# Patient Record
Sex: Male | Born: 1940 | Race: White | Hispanic: No | Marital: Married | Smoking: Former smoker
Health system: Southern US, Community
[De-identification: ages and names within clinical notes are randomized; demographics above are authoritative.]

## PROBLEM LIST (undated history)

## (undated) DIAGNOSIS — I10 Essential (primary) hypertension: Secondary | ICD-10-CM

## (undated) DIAGNOSIS — G629 Polyneuropathy, unspecified: Secondary | ICD-10-CM

## (undated) DIAGNOSIS — K900 Celiac disease: Secondary | ICD-10-CM

## (undated) DIAGNOSIS — Z992 Dependence on renal dialysis: Secondary | ICD-10-CM

## (undated) DIAGNOSIS — K862 Cyst of pancreas: Secondary | ICD-10-CM

## (undated) DIAGNOSIS — H9190 Unspecified hearing loss, unspecified ear: Secondary | ICD-10-CM

## (undated) DIAGNOSIS — Z87442 Personal history of urinary calculi: Secondary | ICD-10-CM

## (undated) DIAGNOSIS — K5792 Diverticulitis of intestine, part unspecified, without perforation or abscess without bleeding: Secondary | ICD-10-CM

## (undated) DIAGNOSIS — Z87448 Personal history of other diseases of urinary system: Secondary | ICD-10-CM

## (undated) DIAGNOSIS — D75829 Heparin-induced thrombocytopenia, unspecified: Secondary | ICD-10-CM

## (undated) DIAGNOSIS — D7582 Heparin induced thrombocytopenia (HIT): Secondary | ICD-10-CM

## (undated) HISTORY — DX: Essential (primary) hypertension: I10

## (undated) HISTORY — DX: Personal history of other diseases of urinary system: Z87.448

## (undated) HISTORY — DX: Celiac disease: K90.0

## (undated) HISTORY — PX: EXPLORATORY LAPAROTOMY: SUR591

## (undated) HISTORY — DX: Heparin induced thrombocytopenia (HIT): D75.82

## (undated) HISTORY — DX: Personal history of urinary calculi: Z87.442

## (undated) HISTORY — PX: TRANSURETHRAL RESECTION OF PROSTATE: SHX73

## (undated) HISTORY — DX: Heparin-induced thrombocytopenia, unspecified: D75.829

## (undated) HISTORY — PX: CHOLECYSTECTOMY: SHX55

## (undated) HISTORY — DX: Dependence on renal dialysis: Z99.2

## (undated) HISTORY — DX: Polyneuropathy, unspecified: G62.9

## (undated) HISTORY — DX: Unspecified hearing loss, unspecified ear: H91.90

## (undated) HISTORY — DX: Cyst of pancreas: K86.2

---

## 1998-07-02 ENCOUNTER — Ambulatory Visit (HOSPITAL_BASED_OUTPATIENT_CLINIC_OR_DEPARTMENT_OTHER): Admission: RE | Admit: 1998-07-02 | Discharge: 1998-07-02 | Payer: Self-pay | Admitting: Orthopedic Surgery

## 2000-09-08 ENCOUNTER — Emergency Department (HOSPITAL_COMMUNITY): Admission: EM | Admit: 2000-09-08 | Discharge: 2000-09-08 | Payer: Self-pay | Admitting: Emergency Medicine

## 2000-09-08 ENCOUNTER — Encounter: Payer: Self-pay | Admitting: Emergency Medicine

## 2002-01-14 ENCOUNTER — Encounter: Admission: RE | Admit: 2002-01-14 | Discharge: 2002-01-14 | Payer: Self-pay | Admitting: Family Medicine

## 2002-01-14 ENCOUNTER — Encounter: Payer: Self-pay | Admitting: Family Medicine

## 2005-12-21 ENCOUNTER — Encounter (INDEPENDENT_AMBULATORY_CARE_PROVIDER_SITE_OTHER): Payer: Self-pay | Admitting: Specialist

## 2005-12-21 ENCOUNTER — Ambulatory Visit (HOSPITAL_BASED_OUTPATIENT_CLINIC_OR_DEPARTMENT_OTHER): Admission: RE | Admit: 2005-12-21 | Discharge: 2005-12-21 | Payer: Self-pay | Admitting: Orthopedic Surgery

## 2006-03-13 ENCOUNTER — Encounter (INDEPENDENT_AMBULATORY_CARE_PROVIDER_SITE_OTHER): Payer: Self-pay | Admitting: Specialist

## 2006-03-14 ENCOUNTER — Inpatient Hospital Stay (HOSPITAL_COMMUNITY): Admission: RE | Admit: 2006-03-14 | Discharge: 2006-03-15 | Payer: Self-pay | Admitting: Urology

## 2007-09-27 ENCOUNTER — Encounter: Admission: RE | Admit: 2007-09-27 | Discharge: 2007-09-27 | Payer: Self-pay | Admitting: Family Medicine

## 2007-11-22 ENCOUNTER — Encounter: Admission: RE | Admit: 2007-11-22 | Discharge: 2007-11-22 | Payer: Self-pay | Admitting: Family Medicine

## 2008-02-08 ENCOUNTER — Encounter: Admission: RE | Admit: 2008-02-08 | Discharge: 2008-02-08 | Payer: Self-pay | Admitting: Gastroenterology

## 2008-02-12 ENCOUNTER — Encounter: Admission: RE | Admit: 2008-02-12 | Discharge: 2008-02-12 | Payer: Self-pay | Admitting: Gastroenterology

## 2008-02-18 ENCOUNTER — Encounter (INDEPENDENT_AMBULATORY_CARE_PROVIDER_SITE_OTHER): Payer: Self-pay | Admitting: Gastroenterology

## 2008-02-18 ENCOUNTER — Ambulatory Visit (HOSPITAL_COMMUNITY): Admission: RE | Admit: 2008-02-18 | Discharge: 2008-02-18 | Payer: Self-pay | Admitting: Gastroenterology

## 2008-03-07 ENCOUNTER — Encounter: Admission: RE | Admit: 2008-03-07 | Discharge: 2008-03-07 | Payer: Self-pay | Admitting: Gastroenterology

## 2009-08-04 ENCOUNTER — Encounter: Admission: RE | Admit: 2009-08-04 | Discharge: 2009-08-04 | Payer: Self-pay | Admitting: Nephrology

## 2009-10-14 ENCOUNTER — Encounter: Admission: RE | Admit: 2009-10-14 | Discharge: 2009-10-14 | Payer: Self-pay | Admitting: Gastroenterology

## 2010-04-06 ENCOUNTER — Encounter: Admission: RE | Admit: 2010-04-06 | Discharge: 2010-04-06 | Payer: Self-pay | Admitting: Neurology

## 2010-07-01 ENCOUNTER — Inpatient Hospital Stay (HOSPITAL_COMMUNITY)
Admission: EM | Admit: 2010-07-01 | Discharge: 2010-07-23 | DRG: 682 | Disposition: A | Discharge status: Home / Self Care | Payer: Medicare Other | Attending: Family Medicine | Admitting: Family Medicine

## 2010-07-01 ENCOUNTER — Emergency Department (HOSPITAL_COMMUNITY): Payer: Medicare Other

## 2010-07-01 DIAGNOSIS — Z79899 Other long term (current) drug therapy: Secondary | ICD-10-CM

## 2010-07-01 DIAGNOSIS — E860 Dehydration: Secondary | ICD-10-CM | POA: Diagnosis present

## 2010-07-01 DIAGNOSIS — I129 Hypertensive chronic kidney disease with stage 1 through stage 4 chronic kidney disease, or unspecified chronic kidney disease: Secondary | ICD-10-CM | POA: Diagnosis present

## 2010-07-01 DIAGNOSIS — N179 Acute kidney failure, unspecified: Principal | ICD-10-CM | POA: Diagnosis present

## 2010-07-01 DIAGNOSIS — N10 Acute tubulo-interstitial nephritis: Secondary | ICD-10-CM | POA: Diagnosis present

## 2010-07-01 DIAGNOSIS — D75829 Heparin-induced thrombocytopenia, unspecified: Secondary | ICD-10-CM | POA: Diagnosis not present

## 2010-07-01 DIAGNOSIS — J189 Pneumonia, unspecified organism: Secondary | ICD-10-CM | POA: Diagnosis present

## 2010-07-01 DIAGNOSIS — D638 Anemia in other chronic diseases classified elsewhere: Secondary | ICD-10-CM | POA: Diagnosis not present

## 2010-07-01 DIAGNOSIS — D7582 Heparin induced thrombocytopenia (HIT): Secondary | ICD-10-CM | POA: Diagnosis not present

## 2010-07-01 DIAGNOSIS — E8779 Other fluid overload: Secondary | ICD-10-CM | POA: Diagnosis not present

## 2010-07-01 DIAGNOSIS — K900 Celiac disease: Secondary | ICD-10-CM | POA: Diagnosis present

## 2010-07-01 DIAGNOSIS — R197 Diarrhea, unspecified: Secondary | ICD-10-CM | POA: Diagnosis not present

## 2010-07-01 DIAGNOSIS — N183 Chronic kidney disease, stage 3 unspecified: Secondary | ICD-10-CM | POA: Diagnosis present

## 2010-07-01 DIAGNOSIS — G7000 Myasthenia gravis without (acute) exacerbation: Secondary | ICD-10-CM | POA: Diagnosis present

## 2010-07-01 DIAGNOSIS — K862 Cyst of pancreas: Secondary | ICD-10-CM | POA: Diagnosis present

## 2010-07-01 DIAGNOSIS — R0902 Hypoxemia: Secondary | ICD-10-CM | POA: Diagnosis present

## 2010-07-01 LAB — DIFFERENTIAL
Basophils Absolute: 0 K/uL (ref 0.0–0.1)
Basophils Relative: 0 % (ref 0–1)
Eosinophils Absolute: 0.1 K/uL (ref 0.0–0.7)
Eosinophils Relative: 0 % (ref 0–5)
Lymphocytes Relative: 4 % — ABNORMAL LOW (ref 12–46)
Lymphs Abs: 0.6 K/uL — ABNORMAL LOW (ref 0.7–4.0)
Monocytes Absolute: 1.2 K/uL — ABNORMAL HIGH (ref 0.1–1.0)
Monocytes Relative: 8 % (ref 3–12)
Neutro Abs: 12 K/uL — ABNORMAL HIGH (ref 1.7–7.7)
Neutrophils Relative %: 87 % — ABNORMAL HIGH (ref 43–77)

## 2010-07-01 LAB — CBC
HCT: 39.7 % (ref 39.0–52.0)
Hemoglobin: 13.8 g/dL (ref 13.0–17.0)
MCH: 33.1 pg (ref 26.0–34.0)
MCHC: 34.8 g/dL (ref 30.0–36.0)
MCV: 95.2 fL (ref 78.0–100.0)
Platelets: 157 K/uL (ref 150–400)
RBC: 4.17 MIL/uL — ABNORMAL LOW (ref 4.22–5.81)
RDW: 13.3 % (ref 11.5–15.5)
WBC: 13.8 K/uL — ABNORMAL HIGH (ref 4.0–10.5)

## 2010-07-01 LAB — COMPREHENSIVE METABOLIC PANEL
ALT: 17 U/L (ref 0–53)
Alkaline Phosphatase: 87 U/L (ref 39–117)
BUN: 43 mg/dL — ABNORMAL HIGH (ref 6–23)
CO2: 20 mEq/L (ref 19–32)
Chloride: 102 mEq/L (ref 96–112)
Glucose, Bld: 110 mg/dL — ABNORMAL HIGH (ref 70–99)
Potassium: 4 mEq/L (ref 3.5–5.1)
Sodium: 133 mEq/L — ABNORMAL LOW (ref 135–145)
Total Bilirubin: 1.3 mg/dL — ABNORMAL HIGH (ref 0.3–1.2)
Total Protein: 6.4 g/dL (ref 6.0–8.3)

## 2010-07-01 LAB — URINALYSIS, ROUTINE W REFLEX MICROSCOPIC
Bilirubin Urine: NEGATIVE
Ketones, ur: 15 mg/dL — AB
Nitrite: NEGATIVE
Protein, ur: 100 mg/dL — AB
Specific Gravity, Urine: 1.014 (ref 1.005–1.030)
Urine Glucose, Fasting: NEGATIVE mg/dL
Urobilinogen, UA: 0.2 mg/dL (ref 0.0–1.0)
pH: 5.5 (ref 5.0–8.0)

## 2010-07-01 LAB — URINE MICROSCOPIC-ADD ON

## 2010-07-01 LAB — CK TOTAL AND CKMB (NOT AT ARMC)
CK, MB: 1.9 ng/mL (ref 0.3–4.0)
Relative Index: INVALID (ref 0.0–2.5)
Total CK: 39 U/L (ref 7–232)

## 2010-07-01 LAB — LACTIC ACID, PLASMA: Lactic Acid, Venous: 1.3 mmol/L (ref 0.5–2.2)

## 2010-07-01 LAB — SODIUM, URINE, RANDOM: Sodium, Ur: 36 mEq/L

## 2010-07-01 LAB — POCT I-STAT 3, ART BLOOD GAS (G3+)
O2 Saturation: 91 %
Patient temperature: 97.9
pCO2 arterial: 44.8 mmHg (ref 35.0–45.0)
pH, Arterial: 7.31 — ABNORMAL LOW (ref 7.350–7.450)

## 2010-07-01 LAB — CREATININE, URINE, RANDOM: Creatinine, Urine: 69 mg/dL

## 2010-07-02 ENCOUNTER — Inpatient Hospital Stay (HOSPITAL_COMMUNITY): Payer: Medicare Other

## 2010-07-02 ENCOUNTER — Encounter (HOSPITAL_COMMUNITY): Payer: Self-pay | Admitting: Radiology

## 2010-07-02 DIAGNOSIS — I517 Cardiomegaly: Secondary | ICD-10-CM

## 2010-07-02 DIAGNOSIS — R0602 Shortness of breath: Secondary | ICD-10-CM

## 2010-07-02 LAB — URINE CULTURE
Colony Count: NO GROWTH
Culture  Setup Time: 201202021830

## 2010-07-02 LAB — COMPREHENSIVE METABOLIC PANEL
ALT: 15 U/L (ref 0–53)
AST: 20 U/L (ref 0–37)
Albumin: 2.7 g/dL — ABNORMAL LOW (ref 3.5–5.2)
Alkaline Phosphatase: 81 U/L (ref 39–117)
Chloride: 103 mEq/L (ref 96–112)
GFR calc Af Amer: 13 mL/min — ABNORMAL LOW (ref >60)
Potassium: 3.8 mEq/L (ref 3.5–5.1)
Sodium: 137 mEq/L (ref 135–145)
Total Bilirubin: 0.8 mg/dL (ref 0.3–1.2)
Total Protein: 6.2 g/dL (ref 6.0–8.3)

## 2010-07-02 LAB — MRSA PCR SCREENING: MRSA by PCR: NEGATIVE

## 2010-07-02 LAB — CBC
MCV: 94.9 fL (ref 78.0–100.0)
Platelets: 155 10*3/uL (ref 150–400)
RBC: 3.9 MIL/uL — ABNORMAL LOW (ref 4.22–5.81)
RDW: 13.5 % (ref 11.5–15.5)
WBC: 10.3 10*3/uL (ref 4.0–10.5)

## 2010-07-02 LAB — HEPARIN LEVEL (UNFRACTIONATED): Heparin Unfractionated: 0.28 IU/mL — ABNORMAL LOW (ref 0.30–0.70)

## 2010-07-02 LAB — BRAIN NATRIURETIC PEPTIDE: Pro B Natriuretic peptide (BNP): 156 pg/mL — ABNORMAL HIGH (ref 0.0–100.0)

## 2010-07-02 MED ORDER — TECHNETIUM TO 99M ALBUMIN AGGREGATED
6.0000 | Freq: Once | INTRAVENOUS | Status: AC | PRN
  Administered 2010-07-02: 6 via INTRAVENOUS

## 2010-07-02 MED ORDER — XENON XE 133 GAS
10.0000 | GAS_FOR_INHALATION | Freq: Once | RESPIRATORY_TRACT | Status: AC | PRN
  Administered 2010-07-02: 10 via RESPIRATORY_TRACT

## 2010-07-03 ENCOUNTER — Other Ambulatory Visit: Payer: Self-pay | Admitting: Internal Medicine

## 2010-07-03 LAB — CBC
HCT: 34.8 % — ABNORMAL LOW (ref 39.0–52.0)
MCH: 33 pg (ref 26.0–34.0)
MCV: 94.1 fL (ref 78.0–100.0)
Platelets: 169 10*3/uL (ref 150–400)
RDW: 13.6 % (ref 11.5–15.5)
WBC: 13.5 10*3/uL — ABNORMAL HIGH (ref 4.0–10.5)

## 2010-07-03 LAB — BASIC METABOLIC PANEL
BUN: 68 mg/dL — ABNORMAL HIGH (ref 6–23)
Chloride: 109 mEq/L (ref 96–112)
Creatinine, Ser: 6.52 mg/dL — ABNORMAL HIGH (ref 0.4–1.5)
GFR calc non Af Amer: 9 mL/min — ABNORMAL LOW (ref >60)
Glucose, Bld: 161 mg/dL — ABNORMAL HIGH (ref 70–99)
Potassium: 3.7 mEq/L (ref 3.5–5.1)

## 2010-07-03 LAB — URINALYSIS, ROUTINE W REFLEX MICROSCOPIC
Nitrite: NEGATIVE
Specific Gravity, Urine: 1.017 (ref 1.005–1.030)
Urobilinogen, UA: 0.2 mg/dL (ref 0.0–1.0)
pH: 5.5 (ref 5.0–8.0)

## 2010-07-03 LAB — URINE MICROSCOPIC-ADD ON

## 2010-07-04 ENCOUNTER — Inpatient Hospital Stay (HOSPITAL_COMMUNITY): Payer: Medicare Other

## 2010-07-04 LAB — CBC
HCT: 36.6 % — ABNORMAL LOW (ref 39.0–52.0)
MCH: 31.5 pg (ref 26.0–34.0)
MCHC: 33.6 g/dL (ref 30.0–36.0)
MCV: 93.8 fL (ref 78.0–100.0)
Platelets: 198 10*3/uL (ref 150–400)
RDW: 13.9 % (ref 11.5–15.5)

## 2010-07-04 LAB — BASIC METABOLIC PANEL
BUN: 77 mg/dL — ABNORMAL HIGH (ref 6–23)
Calcium: 8.2 mg/dL — ABNORMAL LOW (ref 8.4–10.5)
Creatinine, Ser: 6.81 mg/dL — ABNORMAL HIGH (ref 0.4–1.5)
GFR calc non Af Amer: 8 mL/min — ABNORMAL LOW (ref >60)
Glucose, Bld: 143 mg/dL — ABNORMAL HIGH (ref 70–99)
Potassium: 4 mEq/L (ref 3.5–5.1)

## 2010-07-05 ENCOUNTER — Inpatient Hospital Stay (HOSPITAL_COMMUNITY): Payer: Medicare Other

## 2010-07-05 LAB — BASIC METABOLIC PANEL
CO2: 20 mEq/L (ref 19–32)
Calcium: 8.6 mg/dL (ref 8.4–10.5)
Creatinine, Ser: 7.04 mg/dL — ABNORMAL HIGH (ref 0.4–1.5)
GFR calc Af Amer: 9 mL/min — ABNORMAL LOW (ref >60)
Sodium: 141 mEq/L (ref 135–145)

## 2010-07-06 LAB — RENAL FUNCTION PANEL
Albumin: 2.9 g/dL — ABNORMAL LOW (ref 3.5–5.2)
Calcium: 8.6 mg/dL (ref 8.4–10.5)
GFR calc Af Amer: 9 mL/min — ABNORMAL LOW (ref >60)
GFR calc non Af Amer: 7 mL/min — ABNORMAL LOW (ref >60)
Glucose, Bld: 114 mg/dL — ABNORMAL HIGH (ref 70–99)
Phosphorus: 8.7 mg/dL — ABNORMAL HIGH (ref 2.3–4.6)
Potassium: 3.4 mEq/L — ABNORMAL LOW (ref 3.5–5.1)
Sodium: 141 mEq/L (ref 135–145)

## 2010-07-07 LAB — RENAL FUNCTION PANEL
BUN: 95 mg/dL — ABNORMAL HIGH (ref 6–23)
Chloride: 105 mEq/L (ref 96–112)
GFR calc Af Amer: 7 mL/min — ABNORMAL LOW (ref >60)
Glucose, Bld: 121 mg/dL — ABNORMAL HIGH (ref 70–99)
Sodium: 145 mEq/L (ref 135–145)

## 2010-07-07 LAB — CULTURE, BLOOD (ROUTINE X 2)

## 2010-07-08 LAB — RENAL FUNCTION PANEL
CO2: 21 mEq/L (ref 19–32)
Calcium: 8.5 mg/dL (ref 8.4–10.5)
Chloride: 106 mEq/L (ref 96–112)
GFR calc Af Amer: 7 mL/min — ABNORMAL LOW (ref >60)
GFR calc non Af Amer: 6 mL/min — ABNORMAL LOW (ref >60)
Sodium: 139 mEq/L (ref 135–145)

## 2010-07-08 LAB — CBC
Hemoglobin: 13.8 g/dL (ref 13.0–17.0)
MCH: 32.1 pg (ref 26.0–34.0)
MCV: 90.2 fL (ref 78.0–100.0)
RBC: 4.3 MIL/uL (ref 4.22–5.81)

## 2010-07-08 LAB — CULTURE, BLOOD (ROUTINE X 2): Culture: NO GROWTH

## 2010-07-08 LAB — PROTIME-INR: Prothrombin Time: 15.8 seconds — ABNORMAL HIGH (ref 11.6–15.2)

## 2010-07-09 ENCOUNTER — Inpatient Hospital Stay (HOSPITAL_COMMUNITY): Payer: Medicare Other

## 2010-07-09 LAB — RENAL FUNCTION PANEL
Albumin: 2.8 g/dL — ABNORMAL LOW (ref 3.5–5.2)
Chloride: 108 mEq/L (ref 96–112)
Creatinine, Ser: 8.94 mg/dL — ABNORMAL HIGH (ref 0.4–1.5)
GFR calc Af Amer: 7 mL/min — ABNORMAL LOW (ref >60)
GFR calc non Af Amer: 6 mL/min — ABNORMAL LOW (ref >60)
Phosphorus: 9.1 mg/dL (ref 2.3–4.6)

## 2010-07-09 LAB — HEPATITIS B SURFACE ANTIGEN: Hepatitis B Surface Ag: NEGATIVE

## 2010-07-10 LAB — RENAL FUNCTION PANEL
BUN: 55 mg/dL — ABNORMAL HIGH (ref 6–23)
CO2: 25 mEq/L (ref 19–32)
Calcium: 8.9 mg/dL (ref 8.4–10.5)
Creatinine, Ser: 7.62 mg/dL — ABNORMAL HIGH (ref 0.4–1.5)
GFR calc Af Amer: 9 mL/min — ABNORMAL LOW (ref >60)
Glucose, Bld: 96 mg/dL (ref 70–99)

## 2010-07-10 LAB — CBC
HCT: 37.9 % — ABNORMAL LOW (ref 39.0–52.0)
Hemoglobin: 13.4 g/dL (ref 13.0–17.0)
MCH: 32.4 pg (ref 26.0–34.0)
MCHC: 35.4 g/dL (ref 30.0–36.0)
MCV: 91.5 fL (ref 78.0–100.0)

## 2010-07-10 NOTE — Consult Note (Signed)
NAME:  Adam Walsh, Adam Walsh NO.:  [id number]  MEDICAL RECORD NO.:  [id number]           PATIENT TYPE:  I  LOCATION:  5530                         FACILITY:  [facility]  PHYSICIAN:  Dereka Lueras C. Lowell Guitar, M.D.  DATE OF BIRTH:  [birth year]  DATE OF CONSULTATION: DATE OF DISCHARGE:                                CONSULTATION   I was asked by Dr. Rebbeca Paul to see this 70 year old male with acute renal failure.  The patient has history of stage III chronic kidney disease and has been seen in the past by Dr. Briant Cedar at [facility].  Records are not available to [place] from that office at the current time.  The patient presents with several day history of malaise, arthralgias, fever, frequency and flank pain.  He was treated with a 2- day dose of Cipro prior to coming to the hospital.  He presented to the emergency room for further evaluation.  In the emergency room, the patient was noted to have a fever of 102.9 degrees, and his evaluation included CT scan, which revealed bilateral perinephric stranding. Urinalysis revealed 7-10 white blood cells and 3-6 red blood cells per high power field.  The urine culture has been negative.  The patient was recently treated with Avelox and had what might have been onset of an allergic reaction, was started on steroids for this.  He subsequently now is taking Zosyn.  He is defervescing and his flank pain has resolved.  We are asked to see this patient because of rising serum creatinine up to 6.52 mg/dL.  Serum creatinine on admission on July 01, 2010, was 4.87 mg/dL.  According to the patient's wife, she believes that the patient's last serum creatinine measured at some point prior to the hospitalization was in the high 1 range.  PAST MEDICAL HISTORY:  Myasthenia gravis, celiac disease, urolithiasis, status post cholecystectomy, and prior exploratory laparotomy.  SOCIAL HISTORY:  The patient lives with his wife.  He does not  consume alcoholic beverages or smoke cigarettes.  FAMILY HISTORY:  Not remarkable for kidney disease.  Family history is noncontributory.  REVIEW OF SYSTEMS:  The patient has generalized weakness secondary to his myasthenia gravis.  As noted above, he had a questionable allergic reaction to AVELOX this hospitalization.  VITAL SIGNS:  Blood pressure 111/63, afebrile.  I's and O's in the last 24 hours 2570 in, 300 mL out, weight in the last 3 days, today 86.5 kg, prior day 84.4 kg, prior to that 81.64 kg.  CURRENT MEDICATIONS:  Subcu heparin, Solu-Medrol 40 mg q.8 h., Protonix, Zosyn, Mestinon, and Ambien.  PHYSICAL EXAMINATION:  GENERAL:  Pleasant Caucasian male who is supine in bed. HEENT:  Atraumatic, normocephalic.  Extraocular movements intact. LUNGS:  Clear. HEART:  Regular rhythm and rate. ABDOMEN:  Soft.  There is no CVA tenderness.  There is no abdominal tenderness. EXTREMITIES:  With trace edema bilaterally. NEUROLOGIC:  There is generalized weakness.  LABORATORY DATA:  Sodium 137, potassium 3.7, chloride 109, CO2 of 18, BUN 68, creatinine 6.52.  Hemoglobin 12.2, white blood count 13,500, and platelets 169,000.  Chest x-ray, right lung base  patchy density.  ASSESSMENT: 1. Acute renal failure - oliguric. 2. Chronic kidney disease - stage III. 3. Possible bilateral pyelonephritis (culture negative secondary to     prior antibiotic therapy, possibly). 4. Myasthenia gravis. 5. Celiac disease.  PLAN: 1. Renal ultrasound to rule out obstruction or abscess. 2. Recommend wean steroids. 3. Supportive treatment with antibiotics. 4. Check eosinophils and repeat urinalysis. 5. Trial furosemide at night.          ______________________________ Mindi Slicker. Lowell Guitar, M.D.     ACP/MEDQ  D:  07/03/2010  T:  07/04/2010  Job:  [id number]  Electronically Signed by Casimiro Needle M.D. on 07/05/2010 07:58:33 AM

## 2010-07-11 ENCOUNTER — Inpatient Hospital Stay (HOSPITAL_COMMUNITY): Payer: Medicare Other

## 2010-07-11 LAB — RENAL FUNCTION PANEL
BUN: 57 mg/dL — ABNORMAL HIGH (ref 6–23)
CO2: 21 mEq/L (ref 19–32)
Chloride: 109 mEq/L (ref 96–112)
Creatinine, Ser: 8.61 mg/dL — ABNORMAL HIGH (ref 0.4–1.5)
GFR calc non Af Amer: 6 mL/min — ABNORMAL LOW (ref >60)
Potassium: 3.8 mEq/L (ref 3.5–5.1)

## 2010-07-11 LAB — CBC
Hemoglobin: 12.8 g/dL — ABNORMAL LOW (ref 13.0–17.0)
MCH: 32.6 pg (ref 26.0–34.0)
MCV: 91.1 fL (ref 78.0–100.0)
RBC: 3.93 MIL/uL — ABNORMAL LOW (ref 4.22–5.81)
WBC: 10.1 10*3/uL (ref 4.0–10.5)

## 2010-07-12 ENCOUNTER — Inpatient Hospital Stay (HOSPITAL_COMMUNITY): Payer: Medicare Other

## 2010-07-12 LAB — RENAL FUNCTION PANEL
Albumin: 3.3 g/dL — ABNORMAL LOW (ref 3.5–5.2)
BUN: 40 mg/dL — ABNORMAL HIGH (ref 6–23)
Calcium: 9.6 mg/dL (ref 8.4–10.5)
Chloride: 104 mEq/L (ref 96–112)
GFR calc Af Amer: 13 mL/min — ABNORMAL LOW (ref >60)
GFR calc non Af Amer: 11 mL/min — ABNORMAL LOW (ref >60)
Glucose, Bld: 122 mg/dL — ABNORMAL HIGH (ref 70–99)
Glucose, Bld: 163 mg/dL — ABNORMAL HIGH (ref 70–99)
Phosphorus: 6.9 mg/dL — ABNORMAL HIGH (ref 2.3–4.6)
Phosphorus: 4.7 mg/dL — ABNORMAL HIGH (ref 2.3–4.6)
Potassium: 3.9 mEq/L (ref 3.5–5.1)
Potassium: 4.6 mEq/L (ref 3.5–5.1)
Sodium: 139 mEq/L (ref 135–145)
Sodium: 139 mEq/L (ref 135–145)

## 2010-07-13 LAB — RENAL FUNCTION PANEL
BUN: 29 mg/dL — ABNORMAL HIGH (ref 6–23)
CO2: 29 mEq/L (ref 19–32)
Calcium: 9.4 mg/dL (ref 8.4–10.5)
Creatinine, Ser: 6.98 mg/dL — ABNORMAL HIGH (ref 0.4–1.5)
GFR calc Af Amer: 10 mL/min — ABNORMAL LOW (ref >60)
Glucose, Bld: 109 mg/dL — ABNORMAL HIGH (ref 70–99)

## 2010-07-13 LAB — CBC
Hemoglobin: 12.6 g/dL — ABNORMAL LOW (ref 13.0–17.0)
MCH: 31.8 pg (ref 26.0–34.0)
MCHC: 34.9 g/dL (ref 30.0–36.0)

## 2010-07-14 LAB — CBC
HCT: 37.2 % — ABNORMAL LOW (ref 39.0–52.0)
Hemoglobin: 12.8 g/dL — ABNORMAL LOW (ref 13.0–17.0)
MCH: 31.5 pg (ref 26.0–34.0)
MCHC: 34.4 g/dL (ref 30.0–36.0)

## 2010-07-14 LAB — RENAL FUNCTION PANEL
BUN: 37 mg/dL — ABNORMAL HIGH (ref 6–23)
CO2: 26 mEq/L (ref 19–32)
Chloride: 102 mEq/L (ref 96–112)
Creatinine, Ser: 8.65 mg/dL — ABNORMAL HIGH (ref 0.4–1.5)
Glucose, Bld: 110 mg/dL — ABNORMAL HIGH (ref 70–99)

## 2010-07-15 LAB — HEPARIN INDUCED THROMBOCYTOPENIA PNL
Heparin Induced Plt Ab: POSITIVE
UFH High Dose UFH H: 0 % Release
UFH Low Dose 0.1 IU/mL: 0 % Release
UFH SRA Result: NEGATIVE

## 2010-07-15 LAB — RENAL FUNCTION PANEL
CO2: 26 mEq/L (ref 19–32)
Calcium: 9.6 mg/dL (ref 8.4–10.5)
Creatinine, Ser: 9.86 mg/dL — ABNORMAL HIGH (ref 0.4–1.5)
Glucose, Bld: 127 mg/dL — ABNORMAL HIGH (ref 70–99)

## 2010-07-16 LAB — CBC
MCH: 32.4 pg (ref 26.0–34.0)
Platelets: 157 10*3/uL (ref 150–400)
RBC: 3.8 MIL/uL — ABNORMAL LOW (ref 4.22–5.81)
RDW: 12.5 % (ref 11.5–15.5)
WBC: 11.1 10*3/uL — ABNORMAL HIGH (ref 4.0–10.5)

## 2010-07-16 LAB — RENAL FUNCTION PANEL
Albumin: 3.3 g/dL — ABNORMAL LOW (ref 3.5–5.2)
Chloride: 104 mEq/L (ref 96–112)
Creatinine, Ser: 10.43 mg/dL — ABNORMAL HIGH (ref 0.4–1.5)
GFR calc Af Amer: 6 mL/min — ABNORMAL LOW (ref >60)
GFR calc non Af Amer: 5 mL/min — ABNORMAL LOW (ref >60)
Phosphorus: 7.3 mg/dL — ABNORMAL HIGH (ref 2.3–4.6)

## 2010-07-16 NOTE — Progress Notes (Signed)
NAME:  Adam Walsh, Adam Walsh NO.:  [id number]  MEDICAL RECORD NO.:  [id number]           PATIENT TYPE:  LOCATION:                                 FACILITY:  PHYSICIAN:  Andreas Blower, MD       DATE OF BIRTH:  03-Mar-1941                                PROGRESS NOTE   PRIMARY CARE PHYSICIAN: Dr. Lupe Carney.  NEPHROLOGIST: [facility] was initially first seen by Dr. Lowell Guitar.  BRIEF ADMITTING HISTORY AND PHYSICAL: Mr. Mullaly is a 70 year old gentleman with history of myasthenia gravis and celiac disease.  He presented to the ER with complaints of fever and abdominal pain.  RADIOLOGY/IMAGING: The patient has had abdominal x-ray on July 01, 2010, which showed nonspecific bowel gas pattern.  Suspect mild small bowel ileus, although a partial distal small bowel obstruction cannot be excluded.  The patient had chest x-ray two-view, which shows and active cardiopulmonary disease on July 01, 2010.  The patient had CT of the abdomen and pelvis without contrast on July 02, 2010, which showed no acute abnormality.  No abscess or intracranial abdominal cause for fever identified.  Cholecystitis and postsurgical change in the gastroesophageal junction region.  A 14-mm short axis lymph node versus small pseudocyst adjacent to the uncinate process of the pancreas of uncertain clinical significance.  The patient had a V/Q scan on July 02, 2010, which shows no evidence for pulmonary embolism.  The patient had a renal ultrasound on July 04, 2010, which showed unremarkable renal ultrasound demonstrating no evidence of obstruction or significant renal atrophy.  The patient had a most recent chest x-ray on July 05, 2010, which shows new tiny bilateral pleural effusions.  LABORATORY DATA: Most recently showed a CBC with white count of 9.4, hemoglobin 12.6, hematocrit 36.1, platelet count 109.  Electrolytes normal except BUN is 29, creatinine is 6.98 after  dialysis.  HOSPITAL COURSE: 1. Acute renal failure and chronic kidney disease.  The patient was     evaluated by Nephrology.  During the course of the hospital stay,     has had 3 dialysis sessions most recently on July 12, 2010.  He     had his hemodialysis catheter placed on July 09, 2010.  Renal     to help determine his dialysis course if he will need dialysis on     long term. 2. Shortness of breath.  V/Q scan was negative for PE.  Most likely     secondary to pulmonary edema from acute renal failure on chronic     kidney disease.  Resolved with dialysis and Lasix. 3. Questionable pneumonia.  The patient completed 10-day course of     Zosyn. 4. Pyelonephritis.  The patient during the course of hospital stay     completed 10-day course of Zosyn on July 12, 2010. 5. Diarrhea.  During the course hospital stay, the patient had     diarrhea.  C. diff PCR was checked and was negative.  Diarrhea     resolved during the course of hospital stay. 6. Thrombocytopenia.  The patient had a significant platelet count.  Discontinued heparin, we will check his HIT antibody. 7. History of myasthenia gravis, stable outpatient management. 8. History of celiac disease, stable outpatient management. 9. Hyperphosphatemia secondary to acute renal failure and chronic     kidney disease, stable. 10.Small pseudocyst adjacent to the uncinate process of the pancreas     of uncertain clinical significance.  The patient will most likely     need non-emergent outpatient MRI without gadolinium.  DISPOSITION: Pending per Nephrology.     Andreas Blower, MD     SR/MEDQ  D:  07/13/2010  T:  07/13/2010  Job:  [id number]  Electronically Signed by Wardell Heath Jeffory Snelgrove  on 07/16/2010 04:10:00 PM

## 2010-07-17 LAB — RENAL FUNCTION PANEL
Albumin: 3.3 g/dL — ABNORMAL LOW (ref 3.5–5.2)
CO2: 25 mEq/L (ref 19–32)
Calcium: 9.1 mg/dL (ref 8.4–10.5)
Creatinine, Ser: 10.41 mg/dL — ABNORMAL HIGH (ref 0.4–1.5)
GFR calc Af Amer: 6 mL/min — ABNORMAL LOW (ref >60)
GFR calc non Af Amer: 5 mL/min — ABNORMAL LOW (ref >60)
Phosphorus: 7.1 mg/dL — ABNORMAL HIGH (ref 2.3–4.6)
Sodium: 138 mEq/L (ref 135–145)

## 2010-07-17 LAB — CBC
Hemoglobin: 11.9 g/dL — ABNORMAL LOW (ref 13.0–17.0)
MCH: 32.1 pg (ref 26.0–34.0)
MCHC: 34.8 g/dL (ref 30.0–36.0)
Platelets: 151 10*3/uL (ref 150–400)
RDW: 12.6 % (ref 11.5–15.5)

## 2010-07-18 LAB — RENAL FUNCTION PANEL
Albumin: 3.2 g/dL — ABNORMAL LOW (ref 3.5–5.2)
Calcium: 9 mg/dL (ref 8.4–10.5)
Glucose, Bld: 110 mg/dL — ABNORMAL HIGH (ref 70–99)
Phosphorus: 7.6 mg/dL — ABNORMAL HIGH (ref 2.3–4.6)
Potassium: 4.3 mEq/L (ref 3.5–5.1)
Sodium: 139 mEq/L (ref 135–145)

## 2010-07-18 LAB — CBC
MCHC: 34.9 g/dL (ref 30.0–36.0)
Platelets: 157 10*3/uL (ref 150–400)
RDW: 12.6 % (ref 11.5–15.5)
WBC: 10.9 10*3/uL — ABNORMAL HIGH (ref 4.0–10.5)

## 2010-07-18 LAB — CLOSTRIDIUM DIFFICILE BY PCR: Toxigenic C. Difficile by PCR: NEGATIVE

## 2010-07-19 ENCOUNTER — Inpatient Hospital Stay (HOSPITAL_COMMUNITY): Payer: Medicare Other

## 2010-07-19 LAB — RENAL FUNCTION PANEL
Albumin: 3.3 g/dL — ABNORMAL LOW (ref 3.5–5.2)
BUN: 60 mg/dL — ABNORMAL HIGH (ref 6–23)
Calcium: 8.7 mg/dL (ref 8.4–10.5)
Glucose, Bld: 108 mg/dL — ABNORMAL HIGH (ref 70–99)
Phosphorus: 6.3 mg/dL — ABNORMAL HIGH (ref 2.3–4.6)
Potassium: 4 mEq/L (ref 3.5–5.1)
Sodium: 138 mEq/L (ref 135–145)

## 2010-07-19 LAB — CBC
HCT: 32.7 % — ABNORMAL LOW (ref 39.0–52.0)
MCHC: 35.2 g/dL (ref 30.0–36.0)
MCV: 90.3 fL (ref 78.0–100.0)
Platelets: 164 10*3/uL (ref 150–400)
RDW: 12.7 % (ref 11.5–15.5)
WBC: 8.5 10*3/uL (ref 4.0–10.5)

## 2010-07-20 ENCOUNTER — Encounter (HOSPITAL_COMMUNITY): Payer: Self-pay | Admitting: Radiology

## 2010-07-20 ENCOUNTER — Inpatient Hospital Stay (HOSPITAL_COMMUNITY): Payer: Medicare Other

## 2010-07-20 LAB — CBC
MCH: 31.2 pg (ref 26.0–34.0)
MCV: 90.1 fL (ref 78.0–100.0)
Platelets: 164 10*3/uL (ref 150–400)
RDW: 12.7 % (ref 11.5–15.5)

## 2010-07-20 LAB — URINE MICROSCOPIC-ADD ON

## 2010-07-20 LAB — RENAL FUNCTION PANEL
Albumin: 3.1 g/dL — ABNORMAL LOW (ref 3.5–5.2)
BUN: 62 mg/dL — ABNORMAL HIGH (ref 6–23)
Calcium: 9 mg/dL (ref 8.4–10.5)
Creatinine, Ser: 10.33 mg/dL — ABNORMAL HIGH (ref 0.4–1.5)
GFR calc Af Amer: 6 mL/min — ABNORMAL LOW (ref >60)
GFR calc non Af Amer: 5 mL/min — ABNORMAL LOW (ref >60)
Phosphorus: 7 mg/dL — ABNORMAL HIGH (ref 2.3–4.6)

## 2010-07-20 LAB — URINALYSIS, ROUTINE W REFLEX MICROSCOPIC
Bilirubin Urine: NEGATIVE
Ketones, ur: NEGATIVE mg/dL
Protein, ur: 100 mg/dL — AB
Urobilinogen, UA: 0.2 mg/dL (ref 0.0–1.0)

## 2010-07-20 LAB — AMYLASE: Amylase: 166 U/L — ABNORMAL HIGH (ref 0–105)

## 2010-07-20 LAB — SODIUM, URINE, RANDOM: Sodium, Ur: 23 mEq/L

## 2010-07-21 ENCOUNTER — Inpatient Hospital Stay (HOSPITAL_COMMUNITY): Payer: Medicare Other

## 2010-07-21 LAB — CBC
MCHC: 35.6 g/dL (ref 30.0–36.0)
MCV: 91.1 fL (ref 78.0–100.0)
Platelets: 176 10*3/uL (ref 150–400)
RDW: 12.9 % (ref 11.5–15.5)
WBC: 8 10*3/uL (ref 4.0–10.5)

## 2010-07-21 LAB — COMPREHENSIVE METABOLIC PANEL
Albumin: 3.2 g/dL — ABNORMAL LOW (ref 3.5–5.2)
Alkaline Phosphatase: 71 U/L (ref 39–117)
BUN: 62 mg/dL — ABNORMAL HIGH (ref 6–23)
Calcium: 8.8 mg/dL (ref 8.4–10.5)
Potassium: 3.7 mEq/L (ref 3.5–5.1)
Total Protein: 6.6 g/dL (ref 6.0–8.3)

## 2010-07-21 LAB — PHOSPHORUS: Phosphorus: 6.2 mg/dL — ABNORMAL HIGH (ref 2.3–4.6)

## 2010-07-22 DIAGNOSIS — Z01810 Encounter for preprocedural cardiovascular examination: Secondary | ICD-10-CM

## 2010-07-22 DIAGNOSIS — N189 Chronic kidney disease, unspecified: Secondary | ICD-10-CM

## 2010-07-22 LAB — CBC
HCT: 33.2 % — ABNORMAL LOW (ref 39.0–52.0)
Hemoglobin: 11.9 g/dL — ABNORMAL LOW (ref 13.0–17.0)
MCV: 91 fL (ref 78.0–100.0)
RBC: 3.65 MIL/uL — ABNORMAL LOW (ref 4.22–5.81)
RDW: 12.9 % (ref 11.5–15.5)
WBC: 6.4 10*3/uL (ref 4.0–10.5)

## 2010-07-22 LAB — RENAL FUNCTION PANEL
BUN: 31 mg/dL — ABNORMAL HIGH (ref 6–23)
CO2: 26 mEq/L (ref 19–32)
Chloride: 104 mEq/L (ref 96–112)
Glucose, Bld: 96 mg/dL (ref 70–99)
Potassium: 3.8 mEq/L (ref 3.5–5.1)
Sodium: 140 mEq/L (ref 135–145)

## 2010-07-23 ENCOUNTER — Inpatient Hospital Stay (HOSPITAL_COMMUNITY): Payer: Medicare Other

## 2010-07-23 DIAGNOSIS — I120 Hypertensive chronic kidney disease with stage 5 chronic kidney disease or end stage renal disease: Secondary | ICD-10-CM

## 2010-07-23 DIAGNOSIS — N186 End stage renal disease: Secondary | ICD-10-CM

## 2010-07-23 LAB — CBC
HCT: 31.1 % — ABNORMAL LOW (ref 39.0–52.0)
Hemoglobin: 11.1 g/dL — ABNORMAL LOW (ref 13.0–17.0)
MCH: 32.2 pg (ref 26.0–34.0)
MCV: 90.1 fL (ref 78.0–100.0)
Platelets: 156 10*3/uL (ref 150–400)
RBC: 3.45 MIL/uL — ABNORMAL LOW (ref 4.22–5.81)
WBC: 6.2 10*3/uL (ref 4.0–10.5)

## 2010-07-23 LAB — RENAL FUNCTION PANEL
Albumin: 3.2 g/dL — ABNORMAL LOW (ref 3.5–5.2)
BUN: 41 mg/dL — ABNORMAL HIGH (ref 6–23)
CO2: 22 mEq/L (ref 19–32)
Chloride: 104 mEq/L (ref 96–112)
Creatinine, Ser: 7.96 mg/dL — ABNORMAL HIGH (ref 0.4–1.5)
Potassium: 4.1 mEq/L (ref 3.5–5.1)

## 2010-07-24 NOTE — Consult Note (Signed)
NAME:  Adam Walsh, Adam Walsh NO.:  [id number]  MEDICAL RECORD NO.:  [id number]           PATIENT TYPE:  I  LOCATION:  5530                         FACILITY:  [facility]  PHYSICIAN:  Fransisco Hertz, MD       DATE OF BIRTH:  10-02-1940  DATE OF CONSULTATION: DATE OF DISCHARGE:  07/23/2010                                CONSULTATION   REFERRING PHYSICIAN:  Terrial Rhodes, MD  REASON FOR CONSULTATION:  Evaluation for permanent access.  HISTORY OF PRESENT ILLNESS: This is a 70 year old gentleman that was admitted to the hospital on July 01, 2010.  At that point, had high-grade fevers and bilateral flank pain.  Since his admission, the patient has had acute on chronic renal disease.  He apparently baseline was in chronic kidney disease stage III and has progressed to the point that he needed hemodialysis intermittently during this hospitalization, and now it is progressed to the point where he is consistently requiring hemodialysis.  Apparently during this admission, he has been treated for a possible pneumonia, also treated for possible pyelonephritis.  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  At this point, the patient notes other than a right internal jugular tunneled dialysis catheter, he has never had any previous access surgery and denies any other central venous catheterization.  PAST MEDICAL HISTORY: 1. He has a history of myasthenia gravis. 2. Celiac sprue disease. 3. History of nephrolithiasis. 4. History of some possible cholecystitis by report. 5. 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 6. History of possible pneumonia this admission. 7. History of pyelonephritis.  PAST SURGICAL HISTORY:  In this patient include: 1. Placement of right internal jugular tunneled dialysis catheter. 2. He has also had a previous exploratory laparotomy. 3. He has had a transurethral resection of the prostate. 4. He  has had excision of some type of right thumb mass. 5. Previous cholecystectomy.  SOCIAL HISTORY:  The patient lives with his wife.  Denies any alcohol or illicit drug use.  Smoked greater than 30 years ago.  FAMILY HISTORY:  Father died of old age.  Mother's history was unknown.  REVIEW OF SYSTEMS:  When he came into the hospital, he noted generalized weakness secondary to his myasthenia gravis, otherwise the rest of his review of systems was noted to be negative.  PHYSICAL EXAMINATION:  VITAL SIGNS:  He had a temperature of 97.3, blood pressure 157/85, heart rate of 53, respirations were 18, 93% on room air. GENERAL:  He appeared cachectic and elderly, no apparent distress.  He did look a little tired. HEAD:  Normocephalic and atraumatic, some mild temporalis wasting. ENT:  Hearing is grossly intact.  Nares without any drainage or erythema.  Oropharynx had erythema without any exudate.  He had dentures. NECK:  Supple neck.  No nuchal rigidity.  No obvious lymphadenopathy.  I do not appreciate any JVD. EYES:  Pupils were equal, round, and reactive to light.  Extraocular movements were intact. PULMONARY:  He has symmetric expansion.  Good air movement.  No rales, rhonchi, or wheezing.  There was a right internal jugular vein exiting on his right chest. CARDIAC:  Regular rate and rhythm.  Normal S1 and S2.  No murmurs, rubs, thrills, or gallops. VASCULAR:  He had easily palpable pulses in all extremities, a palpable carotids without any bruits either side.  His aorta was not palpable. GI:  Soft.  Abdomen nontender, nondistended.  No guarding.  No rebound. No hepatosplenomegaly.  He did not have any costovertebral angle tenderness. MUSCULOSKELETAL:  He had 5/5 strength.  There was no ischemic changes in any extremity.  He does have some chronic venous changes in both his feet with spider veins and cool feet, but palpable pulses.  No ischemic changes in either feet.  Both of his  upper extremities were warm and demonstrated no findings consistent with any ischemia. NEUROLOGIC:  His cranial nerves II through XII are intact.  His motor strength was as listed above.  Sensation was grossly intact in all extremities.  His intrinsic, extrinsic muscle strength in his hands were intact.  There was some wasting in the musculature of both hands. PSYCHIATRIC:  Judgment was intact.  Mood and affect were appropriate to his clinical situation. SKIN:  Extremities were as listed above.  I do not appreciate otherwise any rashes elsewhere. LYMPHATICS:  He had no cervical, axillary, inguinal lymphadenopathy noted.  LABORATORY STUDIES:  Most recent CBC; he had a white count of 6.2, H and H of 11.1 and 31.1, platelet count of 156.  He had a renal panel completed, which demonstrated sodium of 141, potassium 4.1, chloride 104, bicarb of 22, glucose of 90, BUN of 41, creatinine was 7.96, albumin is 3.2, calcium is 9.0, phosphorus 5.1.  RADIOLOGIC STUDIES:  Most recently, he had an MRI, which demonstrated a 3.3-cm cystic lesion rising from the uncinate process of the pancreas. Apparently, this is enlarged from previous mass that had been imaged there.  He had also a KUB that was done that demonstrates a normal gas pattern.  Chest x-ray:  The most recently was done on April 04, 2010. It demonstrates no infiltrates and no obvious effusion noted on my read.  MEDICAL DECISION MAKING:  This is a 70 year old gentleman, who is now requiring more consistently hemodialysis with his acute on chronic renal failure.  He has got multiple comorbidities including this recently found uncinate pancreas lesion, but at this point, he is relatively stable.  His HIT has stabilized with acceptable platelet count at this point to to proceed with permanent access.  I think it is reasonable to consider placement of a fistula.  He had bilateral vein mapping completed.  I reviewed and read these ultrasounds,  and he has no acceptable veins for use in the right arm.  His cephalic vein is not usable in his left arm; however, the upper arm basilic vein appears to be 3.7 mm upwards of 5 mm in diameter and may be a consideration for use for a staged basilic vein transposition.  I am going to tentatively schedule  this gentleman for a left arm fistula versus graft placement on this coming  Tuesday.  Thank you very much for giving [place] the opportunity to participate in this patient's care.     Fransisco Hertz, MD     BLC/MEDQ  D:  07/23/2010  T:  07/24/2010  Job:  [id number]  Electronically Signed by Leonides Sake MD on 07/24/2010 05:16:31 PM

## 2010-08-03 ENCOUNTER — Ambulatory Visit (HOSPITAL_COMMUNITY)
Admission: RE | Admit: 2010-08-03 | Discharge: 2010-08-03 | Disposition: A | Discharge status: Home / Self Care | Payer: Medicare Other | Source: Ambulatory Visit | Attending: Vascular Surgery | Admitting: Vascular Surgery

## 2010-08-03 DIAGNOSIS — G7000 Myasthenia gravis without (acute) exacerbation: Secondary | ICD-10-CM | POA: Insufficient documentation

## 2010-08-03 DIAGNOSIS — N186 End stage renal disease: Secondary | ICD-10-CM | POA: Insufficient documentation

## 2010-08-03 DIAGNOSIS — Z992 Dependence on renal dialysis: Secondary | ICD-10-CM | POA: Insufficient documentation

## 2010-08-03 DIAGNOSIS — I120 Hypertensive chronic kidney disease with stage 5 chronic kidney disease or end stage renal disease: Secondary | ICD-10-CM

## 2010-08-03 HISTORY — PX: AV FISTULA PLACEMENT, RADIOCEPHALIC: SHX1208

## 2010-08-06 NOTE — Op Note (Signed)
NAME:  Adam, Walsh NO.:  [id number]  MEDICAL RECORD NO.:  [id number]           PATIENT TYPE:  O  LOCATION:  [facility]SC                         FACILITY:  [facility]  PHYSICIAN:  Fransisco Hertz, MD       DATE OF BIRTH:  September 03, 1940  DATE OF PROCEDURE:  08/03/2010 DATE OF DISCHARGE:  08/03/2010                              OPERATIVE REPORT   PROCEDURE:  Left radiocephalic arteriovenous fistula.  PREOPERATIVE DIAGNOSIS:  End-stage renal disease.  POSTOPERATIVE DIAGNOSIS:  End-stage renal disease.  SURGEON:  Arlys John L. Imogene Burn, MD  ANESTHESIA:  Monitor anesthesia care and local.  FINDINGS: palpable left thrill at the end of the case and a dopplerable left radial signal.  The cephalic vein at the wrist level was about a 3 mm on exploration and the radial artery was 2.5 mm.  SPECIMENS:  None.  ESTIMATED BLOOD LOSS:  Minimal.  INDICATIONS:  This is a 70 year old gentleman who recently has entered into end-stage renal disease requiring hemodialysis.  He had obtained a vein mapping on the left arm.  Initially, the forearm cephalic vein was noted to be inadequate diameter; however, on examination with tourniquet it appeared actually he had a 3-4 mm cephalic vein and immediately adjacent to his radial artery.  This patient is also allergic to HEPARIN and has heparin-induced thrombocytopenia, so in my opinion of placement of a graft in this gentleman is a bad idea due to his inability to be anticoagulated with heparin during an access procedure which is the usual practice for placement of a graft, so I felt that every attempt at a fistula should be made.  He is aware of the risks that this procedure include but are not limited to bleeding, infection, possible failure to mature, possible steal syndrome, possible ischemic monomelic neuropathy, possible need for additional procedures.  He agreed to proceed forward with the procedure knowing these risks.  DESCRIPTION OF OPERATION:   After full informed written consent had been obtained from the patient, he was brought back to the operating room, placed supine upon the operating table.  Prior to induction, he had received IV antibiotics.  He was then prepped and draped in standard fashion for left arm access procedure.  I turned my attention first to his left wrist with Penrose drain tied around his upper arm.  There was an easily discernible cephalic vein that was about 3-4 mm in diameter externally.  The tourniquet was release and then I made a mark over the radial  artery at the level of the wrist and then injected 1% lidocaine with epinephrine  to obtain anesthesia in this area.  I then made an incision through the marked area and dissected down to the radial artery.  The radial artery was noted to be about 2.5 mm in diameter.  I then dissected laterally to the cephalic vein which was noted to be about 3 mm in diameter and I transected the distal part of this vein and then interrogated the vein, instilling heparin into it.  It actually distended quite nicely to the 3 to 4 mm diameter range, so I felt  that an attempt at a radiocephalic fistula should be made as the patient has HIT and I think that placement of any graft would require use of some type of anticoagulation, so every attempt at a fistula should be made.  At this point, I clamped the radial artery proximal and distally, made an arteriotomy with 11 blade and opened it with a micro Potts scissor for about a 4-mm arteriotomy. Then, I spatulated the vein and adjusted it for the dimensions of this arteriotomy.  The vein was anastomosed to the artery with a running stitch of 7-0 Prolene.  Prior to completing this anastomosis, I allowed the artery to bleed in retrograde and antegrade fashion with good vigorous bleeding and no clot was noted.  At this point, I completed the anastomosis in the usual fashion, then I interrogated the radial signal proximally  and distally.  Proximally, there was a multiphasic signal.  Distally, there was only a biphasic strong signal.  The signal within the vein was consistent with a widely patent arteriovenous fistula.  There was a palpable thrill at this point.  I then irrigated out the wound.  There was no more active bleeding.  The subcutaneous tissue was reapproximated with a  running stitch of 3-0 Vicryl and then the skin was closed with a running subcuticular 4-0 Monocryl.  The skin was reinforced with Dermabond.  The patient was allowed to awaken without any problems.    Complication: none  Condition: stable.     Fransisco Hertz, MD     BLC/MEDQ  D:  08/03/2010  T:  08/03/2010  Job:  [id number]  Electronically Signed by Leonides Sake MD on 08/05/2010 09:50:57 AM

## 2010-08-09 ENCOUNTER — Ambulatory Visit (INDEPENDENT_AMBULATORY_CARE_PROVIDER_SITE_OTHER): Payer: Medicare Other | Admitting: Surgery

## 2010-08-09 DIAGNOSIS — N186 End stage renal disease: Secondary | ICD-10-CM

## 2010-08-10 NOTE — Assessment & Plan Note (Addendum)
OFFICE VISIT  ATIBA, KIMBERLIN DOB:  1941-04-17                                       08/09/2010 ZOXWR#:[id number]  The patient is a 70 year old gentleman with end-stage renal disease.  He had a left radiocephalic fistula placed by Dr. Imogene Burn on 08/03/2010.  He was sent over today from the dialysis center because they could not feel a thrill within his fistula.  On my examination I concur that this is no longer functioning.  I think the patient needs to be set up for a left upper arm fistula.  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  I do not know whether this played a role in the early thrombosis of his fistula.  Nonetheless, we do need to get usable access and as quickly as possible.  I have scheduled him to have a left upper arm AV fistula to be placed by Dr. Imogene Burn this Thursday.    Jorge Ny, MD Electronically Signed  VWB/MEDQ  D:  08/09/2010  T:  08/10/2010  Job:  [id number]  cc:   Terrial Rhodes, M.D.

## 2010-08-17 ENCOUNTER — Ambulatory Visit (HOSPITAL_COMMUNITY)
Admission: RE | Admit: 2010-08-17 | Discharge: 2010-08-17 | Disposition: A | Discharge status: Home / Self Care | Payer: Medicare Other | Source: Ambulatory Visit | Attending: Vascular Surgery | Admitting: Vascular Surgery

## 2010-08-17 DIAGNOSIS — Z992 Dependence on renal dialysis: Secondary | ICD-10-CM | POA: Insufficient documentation

## 2010-08-17 DIAGNOSIS — N186 End stage renal disease: Secondary | ICD-10-CM | POA: Insufficient documentation

## 2010-08-17 DIAGNOSIS — I120 Hypertensive chronic kidney disease with stage 5 chronic kidney disease or end stage renal disease: Secondary | ICD-10-CM

## 2010-08-17 LAB — POTASSIUM
Potassium: 2.7 mEq/L — CL (ref 3.5–5.1)
Potassium: 2.9 mEq/L — ABNORMAL LOW (ref 3.5–5.1)

## 2010-08-18 NOTE — Op Note (Signed)
NAME:  Adam Walsh, Adam Walsh NO.:  [id number]  MEDICAL RECORD NO.:  [id number]           PATIENT TYPE:  O  LOCATION:  [facility]                         FACILITY:  [facility]  PHYSICIAN:  Fransisco Hertz, MD       DATE OF BIRTH:  1940/10/08  DATE OF PROCEDURE:  08/17/2010 DATE OF DISCHARGE:  08/17/2010                              OPERATIVE REPORT   PROCEDURE:  Left first stage basilic vein transposition.  PREOPERATIVE DIAGNOSIS:  End-stage renal disease requiring hemodialysis.  POSTOPERATIVE DIAGNOSIS:  End-stage renal disease requiring hemodialysis.  SURGEON:  Fransisco Hertz, MD.  ASSISTANT:  Pecola Leisure, PA  ANESTHESIA:  Monitored anesthesia care and local.  FINDINGS:  Dopplerable left radial signal at the end of the case and a palpable thrill.  SPECIMENS:  None.  ESTIMATED BLOOD LOSS:  Minimal.  INDICATIONS:  This is a 70 year old gentleman with end-stage renal disease, previously we have attempted a left radiocephalic arteriovenous fistula, which unfortunately had thrombosed.  This patient has a known history of HIT, so my feelings were that he would benefit from a fistula first strategy given the fact that he cannot tolerate anticoagulation with heparin intraoperatively for any type of graft placement or revision.  Based on his vein mapping, he had a possible basilic vein that would be of adequate diameter for use for a staged basilic vein transposition.  He is aware of the risks of this procedure include, but are not limited to bleeding, infection, possible steal syndrome, possible nerve damage, possible ischemic monomelic neuropathy, possible failure to mature, and possible need for additional procedures.  He is aware of such and has agreed to proceed forward with such.  DESCRIPTION OF OPERATION:  After full informed written consent was obtained from the patient, he was brought back to the operating room and placed supine upon the operating table.  Prior to  induction, he had received IV antibiotics.  He was then prepped and draped in the standard fashion for a left arm access procedure after obtaining adequate sedation.  I then turned my attention to his antecubitum where I injected about 4 mL of 1:1 mix of 0.5% Marcaine without epinephrine and 1% lidocaine with epinephrine, and then I made a longitudinal incision over his brachial artery at the level of the antecubitum.  I then dissected down to the brachial artery with electrocautery and blunt dissection, and then I controlled the artery with a set of vessel loops. At this point, I dissected to a visible cubital vein that appeared to be draining into the basilic vein system.  This vein was noted to be at least 3-4 mm in diameter externally.  I then tied off one side branch to this cubital vein and then clamped the vein slightly onto the forearm, and then transected the vein.  At this point, I tied off the distal cubital vein with a 2-0 silk tie.  I then interrogated the cubital vein with serial dilators.  It actually dilated up to at least 4-5 mm.  I then injected saline into this vein and then clamped it with a clamp.  At this point,  I reset my exposure of the brachial artery and placed the brachial artery under tension proximally and distally with the vessel loops.  I made an arteriotomy with a 11 blade and extended it with a Potts scissor for about a 4-mm arteriotomy.  I then allowed the artery to bleed in an antegrade fashion.  There was good pulsatile bleeding noted.  Then I allowed the distal end of the brachial artery to bleed and there was good backbleeding.  Just to test the patency of the distal aspects of this artery, I passed a 2-mm dilator without any difficulties into this artery.  Then, I injected saline into both ends of the artery.  At this point, I anastomosed the vein to the artery using a running stitch of 7-0 Prolene in an end-to-side fashion.  Prior to completing this  anastomosis, I allowed the vein to back bleed.  There was minimal amount of venous backbleeding and allowed the artery to bleed in an antegrade fashion and there was good pulsatile bleeding still.  I then completed the anastomosis in the usual fashion and then released all clamps and vessel loops.  Immediately, the vein outflow pressurized and immediately there was not a thrill in the venous outflow.  However, by the end of this case, there was a strong palpable thrill in the venous outflow.  At this point, I irrigated out the arm with saline. There was no more active bleeding and then I obtained a continuous Doppler ultrasound.  I interrogated the radial pulse, as there was not a palpable pulse but a strong signal was present.  I then interrogated the brachial artery proximally and distally to this anastomosis and there were multiphasic signals proximally and distally.  The venous outflow signal was consistent with a widely patent arteriovenous fistula.  At this point, the subcutaneous tissue was reapproximated with a running stitch of 3-0 Vicryl.  The skin was then closed with a running subcuticular 4-0 Monocryl.  The skin was then cleaned, dried,  and then the skin closure was reinforced with Dermabond.  The patient was allowed to awaken without difficulties with the plan to discharge home.  COMPLICATIONS:  None.  CONDITION:  Stable.     Fransisco Hertz, MD     BLC/MEDQ  D:  08/17/2010  T:  08/17/2010  Job:  [id number]  Electronically Signed by Leonides Sake MD on 08/18/2010 09:21:03 AM

## 2010-08-19 LAB — POCT I-STAT 4, (NA,K, GLUC, HGB,HCT)
Glucose, Bld: 124 mg/dL — ABNORMAL HIGH (ref 70–99)
HCT: 29 % — ABNORMAL LOW (ref 39.0–52.0)
Potassium: 2.7 mEq/L — CL (ref 3.5–5.1)

## 2010-08-20 NOTE — Progress Notes (Signed)
NAME:  Adam Walsh, Adam Walsh NO.:  [id number]  MEDICAL RECORD NO.:  [id number]           PATIENT TYPE:  LOCATION:                                 FACILITY:  PHYSICIAN:  Ramiro Harvest, MD    DATE OF BIRTH:  [birth year]                                PROGRESS NOTE   CURRENT DIAGNOSES: 1. Acute on chronic kidney disease, stage III. 2. Abdominal distention. 3. Pyelonephritis. 4. Diarrhea resolved with Clostridium difficile negative x3. 5. Thrombocytopenia, resolved. 6. Myasthenia gravis. 7. Hyperphosphatemia. 8. Elevated blood pressure likely secondary to anxiety. 9. History of celiac disease. 10.Small pseudocyst adjacent to uncinate process of the pancreas of     uncertain clinical significance.  CURRENT MEDICATIONS: 1. PhosLo 1334 one tab p.o. t.i.d. 2. Protonix 40 mg p.o. b.i.d. 3. Mestinon 30 mg p.o. q.8 h. 4. Nephro-Vite 1 tablet p.o. at bedtime. 5. Normal saline and 100 mL/hour.  PROCEDURES PERFORMED:During this time period, abdominal x-ray was done on July 19, 2010, that showed nonobstructive bowel gas pattern.  MRI is pending, which is to be done on July 20, 2010.  CONSULTANTS: [employer].  HOSPITAL COURSE: 1. Acute on chronic kidney disease stage III.  The patient's prior to     July 14, 2010, had had 3 hemodialysis sessions.  The patient     had remained in stable condition.  However, his renal function     continued to increase throughout this period.  The patient did have     good urinary output.  His IV Lasix was subsequently discontinued.     He has been started back on IV fluids per Renal and his renal     function is being monitored as of July 20, 2010.  The patient's     creatinine is up to 10.33, it will be decided per the Renal doctors     as to whether the patient needs any further hemodialysis.  Ideally     and hopefully, the patient's renal function will plateau and start     to improve.  The patient is  being followed by [facility]. 2. Abdominal distention.  During the hospitalization over the past 2-3     days, the patient has had some abdominal distention with a     questionable etiology, x-rays which were obtained were negative as     stated above.  Lipase was obtained, which was within normal limits.     Amylase is at 166.  The patient has had some mucousy yellowish     stool and has had some improvement.  His diet has now subsequently     been changed from a renal diet back to a celiac diet.  Hopefully,     this will improve with his abdominal distention an MRI is currently     pending.  MRI is currently pending of his abdomen and pelvis for     any other etiology.  On prior abdominal films, which were obtained     during this hospitalization on the CT of the abdomen and pelvis  that was noted small pseudocysts adjacent to the uncinate process     of the pancreas of uncertain clinical significance.  MRI is     currently pending and further evaluate this.  The patient will be     monitored on his current diet. 3. Thrombocytopenia.  During this time period, the patient did develop     a thrombocytopenia.  His platelet function went down as low as 107.     His heparin was discontinued, a HIT panel was obtained and a     serotonin release assay was negative.  The patient's heparin was     discontinued.  He was placed on SCDs and his thrombocytopenia has     improved on a daily basis and as of July 20, 2010, it is     currently within normal limits at 164.  For the rest of the     hospitalization, please see our prior progress note dictated per     Dr. Betti Cruz of job number [id number].  It has been a pleasure taking care of [name].     Ramiro Harvest, MD     DT/MEDQ  D:  07/20/2010  T:  07/20/2010  Job:  [id number]  Electronically Signed by Ramiro Harvest MD on 08/20/2010 12:33:51 PM

## 2010-09-02 ENCOUNTER — Ambulatory Visit (INDEPENDENT_AMBULATORY_CARE_PROVIDER_SITE_OTHER): Payer: Medicare Other

## 2010-09-02 DIAGNOSIS — N186 End stage renal disease: Secondary | ICD-10-CM

## 2010-09-02 NOTE — Assessment & Plan Note (Signed)
OFFICE VISIT  BRYER, COZZOLINO DOB:  [birth year]                                       09/02/2010 ZOXWR#:[id number]  DATE OF SURGERY:  August 17, 2010.  The patient presents today for routine follow-up status post placement of first-stage left basilic vein transposition.  The patient is without complaint and denies signs and symptoms of steal.  He is dialyzing successfully via an IJ Diatek catheter.  PHYSICAL EXAMINATION:  There is a 2+ thrill present in the fistula. Motor and sensation is intact in the bilateral upper extremities.  He has 2+ radial pulses present bilaterally.  The incision is clean, dry, and intact and healing well.  ASSESSMENT/PLAN:  The patient will continue to utilize his Diatek catheter until the fistula becomes usable.  The patient will need to follow-up with Dr. Imogene Burn in approximately 2-3 weeks for Dr. Imogene Burn to continue to follow the maturation of the fistula as well as to schedule the second stage or superficialization of the basilic vein transposition.  Pecola Leisure, PA  Charles E. Fields, MD Electronically Signed  AY/MEDQ  D:  09/02/2010  T:  09/02/2010  Job:  [id number]

## 2010-09-04 NOTE — H&P (Signed)
NAME:  Adam Walsh, Adam Walsh NO.:  [id number]  MEDICAL RECORD NO.:  [id number]           PATIENT TYPE:  E  LOCATION:  [facility]                         FACILITY:  [facility]  PHYSICIAN:  Richarda Overlie, MD       DATE OF BIRTH:  [birth year]  DATE OF ADMISSION:  07/01/2010 DATE OF DISCHARGE:                             HISTORY & PHYSICAL   PRIMARY CARE PHYSICIAN:  L. Lupe Carney, MD  CHIEF COMPLAINT:  Fever and abdominal pain.  SUBJECTIVE:  This is a 70 year old male with a history of myasthenia gravis and celiac disease who presents to the ED with a chief complaint of fever and not feeling well for the last 3 or 4 days.  The patient states that he has had bilateral flank pain for the last 3-4 days associated with fever up to 102 degrees Fahrenheit at home.  He has also had intermittent nausea and one episode of vomiting this morning.  He has also had some neck stiffness, but no obvious headache, diplopia, or blurry vision.  He also complains of having sore throat.  He has had an occasional cough for the last 2-1/2 years, the cough was described as dry, but according to the wife, he has been coughing more so for the last 3 or 4 days.  He denies any shortness of breath.  He has experienced chest pain which is pleuritic in nature.  The chest pain and has been on and off for the last 3 or 4 days anywhere lasting from 15 minutes to up to an hour.  He also complains of feeling bloated and distended and he states that his last bowel movement was yesterday.  He has a history of nephrolithiasis and has noted his urine to be dark in color.  He has also had increased urinary frequency and he has been urinating at least six times during the night.  He denies any diarrhea and states that his symptoms from celiac disease are fairly stable on a gluten-free diet.  He denies any sick contacts or any recent history of travels.  He saw his primary care doctor, Dr. Melba Coon yesterday  who prescribed the ciprofloxacin and the patient has been taking his ciprofloxacin, thus far he has only taken two dosages.  He has also had decreased oral intake.  In the ED, he is found to be in acute renal failure, febrile up to a temperature 102.9 and was found to be tachycardic.  He is being admitted for further evaluation.  PAST MEDICAL HISTORY: 1. History of myasthenia gravis and celiac disease. 2. History of nephrolithiasis.  PAST SURGICAL HISTORY:  Cholecystectomy, exploratory laparotomy when he was in his early 18s.  ALLERGIES:  No known drug allergies.  SOCIAL HISTORY:  Nonsmoker, nonalcoholic.  Lives with his wife.  He is married.  HOME MEDICATIONS: 1. Azathioprine was stopped on Tuesday.  He has taken 2 weeks worth of     azathioprine. 2. Ciprofloxacin 500 p.o. b.i.d. 3. Omeprazole 20 mg p.o. daily. 4. Pyridostigmine 30 mg p.o. q.8 h. 5. Vicodin 5/500 q.4 h. p.r.n.  REVIEW OF SYSTEMS:  Complete review of  systems was done as documented in the HPI.  PHYSICAL EXAMINATION:  VITAL SIGNS:  Blood pressure 125/58, pulse of 104, respirations 18, temperature 102.9. GENERAL:  The patient is currently alert, oriented, comfortable in no acute cardiopulmonary distress. HEENT:  Pupils are equal and reactive.  Extraocular movements are intact. NECK:  Supple.  No JVD. LUNGS:  Clear to auscultation bilaterally.  No wheezes, crackles or rhonchi. CARDIOVASCULAR:  Regular rate and rhythm.  No murmurs, rubs, or gallops. ABDOMEN:  Decreased bowel sounds.  No tenderness to palpation.  No hepatosplenomegaly.  No rebound, no rigidity, no guarding.  No CVA tenderness that I could elicit. MUSCULOSKELETAL:  No point tenderness of the spine. NEUROLOGIC:  Cranial nerves II-XII grossly intact. PSYCHIATRIC:  Appropriate mood and affect.  LABORATORY STUDIES:  Abdominal x-ray shows nonspecific bowel gas pattern, mild small bowel ileus.  Chest x-ray shows no active cardiopulmonary disease.   Procalcitonin level of 4.13, lipase 15. Comprehensive metabolic panel; sodium 133, potassium 4.0, chloride 102, bicarb 20, glucose 110, BUN 43, creatinine 4.87.  Total bilirubin is 1.37, AST 23, ALT 17, total protein 6.4, albumin 3.0, calcium 8.5. Lactic acid 1.4.  Urine urinalysis shows 7-10 wbc's per high-power field.  Urinalysis shows moderate amount of leukocyte esterase with a specific gravity of 1.014 with urate crystals.  CBC; WBC 13.8, hemoglobin 13.8, hematocrit 39.7, and platelet count of 157.  ASSESSMENT AND PLAN:  A 70 year old male with a history of myasthenia gravis and celiac disease who presents with acute renal failure and fever likely secondary to pyelonephritis with an intermittent cough and chest pain. 1. Chest pain.  The patient will be admitted to telemetry and we will     cycle his cardiac enzymes.  Given the pleuritic nature of his chest     pain, we will do a stat D-dimer, I will do a V/Q scan to rule out a     pulmonary embolism. 2. Questionable history of ileus/partial small bowel obstruction.  The     patient is passing gas.  He does have nausea and has had one     episode of vomiting.  However, he was not nauseous during this     interview and in fact had just finished his dinner and was feeling     fine.  At this point, we will continue him on a clear liquid diet,     encourage ambulation and observe him.  If he continues to have     nausea and vomiting, we will make him n.p.o.  We ill also do a CT     urogram to further elucidate if he indeed has partial small bowel     obstruction or not. 3. Acute renal failure.  He has a history of nephrolithiasis.  We will     do a CT urogram to rule out obstructive uropathy.  We will monitor     his I's and O's.  We will do a urine sodium, urine creatinine, and     empirically cover him with Rocephin for possible pyelonephritis.     Blood cultures and urine cultures have been obtained.  His     procalcitonin is  elevated probably secondary to his urinary     process. 4. Cough.  The patient has had an occasional cough for the last     several years worse over the last couple of days.     His chest x-ray is negative, it is possible that the patient has a  pneumonia and because he is dehydrated, his initial x-ray is     negative, therefore, we will empirically start him on Avelox to     cover him for possible underlying pneumonia.  He is a full code.     Richarda Overlie, MD     NA/MEDQ  D:  07/01/2010  T:  07/01/2010  Job:  [id number]  Electronically Signed by Richarda Overlie MD on 09/04/2010 07:36:09 PM

## 2010-09-24 ENCOUNTER — Ambulatory Visit (INDEPENDENT_AMBULATORY_CARE_PROVIDER_SITE_OTHER): Payer: Medicare Other | Admitting: Vascular Surgery

## 2010-09-24 DIAGNOSIS — N186 End stage renal disease: Secondary | ICD-10-CM

## 2010-09-24 NOTE — Assessment & Plan Note (Signed)
OFFICE VISIT  RIO, KIDANE DOB:  06/16/1940                                       09/24/2010 ZOXWR#:[id number]  This is a postoperative followup.  HISTORY OF PRESENT ILLNESS:  This is a 70 year old gentleman who I placed a left first stage basilic vein transposition on 08/17/2010.  He now presents for postoperative evaluation.  He has had no steal symptomatology.  He is able to complete his activities of daily living.  PHYSICAL EXAMINATION:  Today he had a blood pressure 157/71, a heart rate of 67, respirations were 12.  On focused examination the left arm demonstrates a strong thrill throughout the basilic vein distribution. I looked at it under Sonosite and the extent of which I could see, it appears to be greater than 6 mm throughout.  So at this point it would be ready for transposition.  There also appears to be some side branches so I suspect with ligation of the side branches this will become even more useful as a conduit.  MEDICAL DECISION MAKING:  This is a 70 year old gentleman who is status post a first stage basilic vein transposition that was successful.  The first stage has matured now and we will proceed with the actual transposition as a second stage.  I tentatively will have him scheduled for May 8 for the second stage basilic vein transposition.    Fransisco Hertz, MD Electronically Signed  BLC/MEDQ  D:  09/24/2010  T:  09/24/2010  Job:  [id number]

## 2010-10-06 ENCOUNTER — Ambulatory Visit (HOSPITAL_COMMUNITY)
Admission: RE | Admit: 2010-10-06 | Discharge: 2010-10-06 | Disposition: A | Discharge status: Home / Self Care | Payer: Medicare Other | Source: Ambulatory Visit | Attending: Vascular Surgery | Admitting: Vascular Surgery

## 2010-10-06 DIAGNOSIS — Z87891 Personal history of nicotine dependence: Secondary | ICD-10-CM | POA: Insufficient documentation

## 2010-10-06 DIAGNOSIS — G7000 Myasthenia gravis without (acute) exacerbation: Secondary | ICD-10-CM | POA: Insufficient documentation

## 2010-10-06 DIAGNOSIS — I120 Hypertensive chronic kidney disease with stage 5 chronic kidney disease or end stage renal disease: Secondary | ICD-10-CM

## 2010-10-06 DIAGNOSIS — Z992 Dependence on renal dialysis: Secondary | ICD-10-CM | POA: Insufficient documentation

## 2010-10-06 DIAGNOSIS — N186 End stage renal disease: Secondary | ICD-10-CM

## 2010-10-06 DIAGNOSIS — Z01812 Encounter for preprocedural laboratory examination: Secondary | ICD-10-CM | POA: Insufficient documentation

## 2010-10-06 HISTORY — PX: OTHER SURGICAL HISTORY: SHX169

## 2010-10-06 LAB — SURGICAL PCR SCREEN
MRSA, PCR: NEGATIVE
Staphylococcus aureus: NEGATIVE

## 2010-10-06 LAB — POCT I-STAT 4, (NA,K, GLUC, HGB,HCT)
Glucose, Bld: 103 mg/dL — ABNORMAL HIGH (ref 70–99)
HCT: 34 % — ABNORMAL LOW (ref 39.0–52.0)
Potassium: 4.9 mEq/L (ref 3.5–5.1)

## 2010-10-12 NOTE — Op Note (Signed)
NAME:  Adam Walsh, Adam Walsh NO.:  [id number]   MEDICAL RECORD NO.:  [id number]          PATIENT TYPE:  AMB   LOCATION:  ENDO                         FACILITY:  [facility]   PHYSICIAN:  Graylin Shiver, M.D.   DATE OF BIRTH:  [birth year]   DATE OF PROCEDURE:  02/18/2008  DATE OF DISCHARGE:                               OPERATIVE REPORT   PROCEDURE:  Esophagogastroduodenoscopy with biopsy.   INDICATIONS FOR PROCEDURE:  Persistent complaints of ongoing diarrhea of  uncertain etiology.   The patient also has been experiencing weight loss.   Informed consent was obtained after explanation of the risks of  bleeding, infection, and perforation.   PREMEDICATION:  Fentanyl 80 mcg IV and Versed 9 mg IV.   PROCEDURE:  With the patient in the left lateral decubitus position, the  Pentax gastroscope was inserted into the oropharynx and passed into the  esophagus.  It was advanced down the esophagus, then into the stomach,  and into the duodenum.  The second and third portions of the duodenum  looked normal.  Random biopsies were obtained from the distal duodenum  to look for any evidence of celiac disease.  The duodenal bulb showed a  small polyp in the proximal portion of the bulb.  This was biopsied.  I  suspect it is a Pharmacist, community gland.  No ulcers were seen.  The stomach  revealed a generalized erythematous appearance to the mucosa and some  random biopsies were obtained from the antrum.  In the antrum, there was  one small nodule that was biopsied.  In the body of the stomach, there  was a small polyp biopsied.  No lesions were seen in the fundus or  cardia of the stomach.  The esophagogastric junction was at 35 cm and  looked normal as did the esophagus in general.  After removing the  forward-viewing gastroscope, I then passed in a lateral viewing scope to  get a good look of the papilla of Vater area.  This area looked somewhat  prominent, and biopsies were obtained for  histological inspection.  The  patient did previously report that years ago, he went to [facility]  where they were checking to see if he had bile duct stones, and it  sounds like he had several ERCPs from what he told me.  I was unclear in  looking at the papilla if a sphincterotomy had been performed in the  past.  I could see bile coming out of the papillary orifice and again  the papillary area did look prominent and was biopsied.  He tolerated  this procedure well without immediate complications.   IMPRESSION:  1. Erythema of the stomach.  2. Prominent papilla.  3. Small antral nodule.  4. Small gastric polyp.  5. Small polyp in the bulb of the duodenum, which I suspect is a      Brunner gland.   PLAN:  I see nothing specifically on this examination to explain the  patient's ongoing diarrhea and weight loss.  He has had a colonoscopy,  which was unrevealing for a cause of  his diarrhea and weight loss.  A CT  scan as well as MRI have shown a couple of cystic lesions in the  uncinate process of the pancreas, which interestingly were not present  in June of this year on a CT scan back exam.  He had some further labs  done last week, which showed a minimal elevation of serum gastrin of  128, upper limit of normal been 115, and a CA 19-9 be an elevated at 77,  upper limit of normal 35, and a CEA level was normal at 3.6.  We will  order a TSH, serum VIP, urine 5-HIAA, and stool for sodium and potassium  to try to determine if we are dealing with a secretory diarrhea, and I  will also order a 72-hour stool collection for quantitative fecal fat  and weight in grams to determine whether he could be malabsorbing.  The  patient did try a pancreatic enzyme for a few days, but he states this  made him nauseated and he thinks it made him worse.  I will give him a  therapeutic trial of Questran to use to see if this might help pending  further results of other tests.  We will follow him up in  the office.           ______________________________  Graylin Shiver, M.D.     SFG/MEDQ  D:  02/18/2008  T:  02/19/2008  Job:  [id number]   cc:   L. Lupe Carney, M.D.

## 2010-10-15 NOTE — Op Note (Signed)
NAME:  Adam Walsh, Adam Walsh NO.:  [id number]   MEDICAL RECORD NO.:  [id number]          PATIENT TYPE:  OIB   LOCATION:  1429                         FACILITY:  [facility]   PHYSICIAN:  Bertram Millard. Dahlstedt, M.D.DATE OF BIRTH:  08-11-1940   DATE OF PROCEDURE:  03/13/2006  DATE OF DISCHARGE:                                 OPERATIVE REPORT   PREOPERATIVE DIAGNOSIS:  Benign prostatic hypertrophy.   POSTOPERATIVE DIAGNOSIS:  Benign prostatic hypertrophy.   PROCEDURE:  Transurethral resection of prostate.   SURGEON:  Dr. Retta Diones   ASSISTANT:  Dr. Wilson Singer   ANESTHESIA:  General endotracheal.   BLOOD LOSS:  Minimal.   SPECIMENS:  Prostate chips to pathology.   DRAINS:  24-French three-way Foley catheter with continuous bladder  irrigation.   COMPLICATIONS:  None.   DISPOSITION:  Stable to post anesthesia care unit.   INDICATIONS:  Mr. Dolberry is a 70 year old male with a history of BPH and  obstructive lower urinary tract symptoms.  He has been on Flomax and  believes that his symptoms are worsening.  He requests further surgical  intervention.  Benefits and risks of transurethral resection has been  discussed and his consent was obtained.   DESCRIPTION OF PROCEDURE:  The patient was brought to the operating room,  properly identified.  Time-out was performed to confirm correct patient and  procedure.  He was administered general anesthesia and given preoperative  antibiotics and placed in the dorsolithotomy position and prepped and draped  in sterile fashion.  The patient did have a mid shaft hypospadias.  This was  gently calibrated with the [employer] sounds to approximately 30-French.  We  then placed a 28-French resectoscope.  The bladder was systematically  examined.  Both ureteral orifices were identified effluxing clear urine.  There was no evidence of bladder tumor.  The bladder was 1+ trabeculated.  The prostatic urethra exhibited trilobar hypertrophy.   We then began our  resection at the 6 o'clock position.  The median lobe was resected and the  channel was created down to the verumontanum.  We then resected the  patient's right side.  The adenoma was removed down to the level of the  capsule.  Bleeders were fulgurated as encountered.  We then turned our  attention to the contralateral side and this was repeated.  Care was taken  to ensure that the resection margin was limited by the level of the  verumontanum so as to ensure no injury to the sphincter.  At the conclusion  of the procedure the patient's prostatic urethra was wide open.  Hemostasis  was reasonable.  We then evacuated all the prostate chips using an  evacuator.  We then looked back in and fulgurated any remaining bleeders.  The resectoscope was removed and a Foley catheter was placed and he was  connected to continuous bladder  irrigation with slight traction.  The urine was clearing.  He was then  awoken from anesthesia and transported to recovery room in stable condition.  There is no complications.  Please note that Dr. Retta Diones was present and  participated  in all aspects of this procedure.     ______________________________  Terie Purser, MD      Bertram Millard. Dahlstedt, M.D.  Electronically Signed    JH/MEDQ  D:  03/13/2006  T:  03/13/2006  Job:  [id number]

## 2010-10-15 NOTE — Op Note (Signed)
NAME:  REEVE, TURNLEY NO.:  [id number]   MEDICAL RECORD NO.:  [id number]          PATIENT TYPE:  AMB   LOCATION:  DSC                          FACILITY:  [facility]   PHYSICIAN:  Cindee Salt, M.D.       DATE OF BIRTH:  Mar 26, 1941   DATE OF PROCEDURE:  12/21/2005  DATE OF DISCHARGE:                                 OPERATIVE REPORT   PREOPERATIVE DIAGNOSIS:  Mass, right thumb.   POSTOPERATIVE DIAGNOSIS:  Mass, right thumb.   OPERATION:  Excision mass, right thumb.   SURGEON:  Merlyn Lot, M.D.   ASSISTANTCarolyne Fiscal, R.N.   ANESTHESIA:  IV regional general.   HISTORY:  The patient is a 70 year old male with a history of a large mass  on the distal phalanx of his right thumb.  This showed some erosion into the  bone.  He is admitted for removal.  He is advised potential for recurrence,  infection, injury to arteries-nerves-tendons, numbness and tingling.  He has  elected to proceed to have this removed.   PROCEDURE:  The patient was brought to the operating room after having this  marked in the preoperative area.  A regional anesthetic, IV regional, was  performed.  He was prepped using DuraPrep, supine position, right arm free.  After a 3-minute dry time, he was draped.  An incision was made obliquely  across the pulp of the thumb in an effort to preserve the ulnar  neurovascular bundle.  This was done from a distal ulnar to proximal radial  position in at the proximal portion of the mass was more radial than ulnar.  The incision was carried down through subcutaneous tissue.  He had some  feeling.  A general anesthetic was given by LMA.  The dissection was carried  down preserving neurovascular structures.  With blunt sharp dissection, the  mass was found to be encapsulated, a light tan in color, multilobulated.  This was dissected free from the radial side and then brought across the  flexor tendon and elevated off from the flexor tendon and off from the bone  from its  dorsal aspect.  This allowed it to be easily able to be dissected  free from the neurovascular structures on the ulnar side, preserving them.  The mass measured approximately 2.5 x 3 cm in size and was sent to pathology  in toto.  The wound was copiously irrigated with saline.  The skin was then  closed with interrupted 5-0 nylon sutures.  A sterile compressive dressing  and splint to the thumb applied.  The patient tolerated the procedure well  and was taken to the recovery room for observation in satisfactory  condition.  He is discharged home to return the [facility] in  1 week on Vicodin.           ______________________________  Cindee Salt, M.D.     GK/MEDQ  D:  12/21/2005  T:  12/21/2005  Job:  [id number]

## 2010-10-15 NOTE — H&P (Signed)
NAME:  Adam Walsh, NIMS NO.:  [id number]   MEDICAL RECORD NO.:  [id number]          PATIENT TYPE:  OIB   LOCATION:  1429                         FACILITY:  [facility]   PHYSICIAN:  Bertram Millard. Dahlstedt, M.D.DATE OF BIRTH:  24-Apr-1941   DATE OF ADMISSION:  03/13/2006  DATE OF DISCHARGE:                                HISTORY & PHYSICAL   CHIEF COMPLAINT:  BPH.   HISTORY OF PRESENT ILLNESS:  Mr. Leonhardt is a 70 year old male who has a  history of BPH.  He has been managed with Flomax.  He has had worsening of  his symptoms and has requested a further surgical intervention.  He has been  evaluated with Dr. Retta Diones, and decision was made to proceed with  transurethral resection of prostate.   He has been doing well recently.  He has no hematuria, dysuria or any  urinary tract infection.   ALLERGIES:  None.   MEDICATIONS:  Flomax.   PAST MEDICAL HISTORY:  As above   PAST SURGICAL HISTORY:  1. Cholecystectomy.  2. Stomach surgery.   SOCIAL HISTORY:  The patient is married.  He has a history of smoking and  denies alcohol use.   FAMILY HISTORY:  Is positive for diabetes and hypertension.   REVIEW OF SYSTEMS:  Is performed and is as above, otherwise negative.   PHYSICAL EXAMINATION:  VITAL SIGNS: Afebrile.  Vital signs stable.  GENERAL: No acute distress.  HEART: Regular.  LUNGS:  Clear.  ABDOMEN: Soft and nontender.  GU: Reveals a hypospadias approximate mid shaft.  Testicles descended  bilaterally.  EXTREMITIES: There is no cyanosis, clubbing or edema.  NEUROLOGIC:  He is awake and alert.   ASSESSMENT:  A 70 year old male with benign prostatic hypertrophy and  significant urinary tract symptoms.   PLAN:  1. The patient will undergo transurethral resection of prostate. He will      be admitted postoperatively for routine postop care.     ______________________________  Terie Purser, MD      Bertram Millard. Dahlstedt, M.D.  Electronically Signed    JH/MEDQ  D:  03/13/2006  T:  03/13/2006  Job:  [id number]

## 2010-10-19 NOTE — Op Note (Signed)
NAME:  Adam Walsh, Adam Walsh NO.:  [id number]  MEDICAL RECORD NO.:  [id number]           PATIENT TYPE:  O  LOCATION:  SDSC                         FACILITY:  [facility]  PHYSICIAN:  Fransisco Hertz, MD       DATE OF BIRTH:  04/01/1941  DATE OF PROCEDURE:  10/06/2010 DATE OF DISCHARGE:  10/06/2010                              OPERATIVE REPORT   PROCEDURE:  Left second stage basilic vein transposition.  PREOPERATIVE DIAGNOSIS:  End-stage renal disease, requiring hemodialysis and status post a left first stage basilic vein transposition.  POSTOPERATIVE DIAGNOSIS:  End-stage renal disease, requiring hemodialysis and status post a left first stage basilic vein transposition.  SURGEON:  Fransisco Hertz, MD  ASSISTANT:  Newton Pigg, PA  ANESTHESIA IN THIS CASE:  General.  FINDINGS IN THIS CASE:  Included a palpable left radial pulse and easily palpable thrill in the transposed basilic vein, also the basilic vein was from 8 mm to 10 mm in diameter.  There were no specimens in the case.  ESTIMATED BLOOD LOSS:  Minimal.  INDICATIONS:  This is a 70 year old gentleman who is on dialysis via tunneled dialysis catheter who previously had a left basilic vein transposition completed and now is matured adequately.  He is scheduled today for second stage basilic vein transposition.  He is aware of the risks of this procedure include bleeding, infection, possible steal syndrome, possible nerve damage, possible ischemic monomelic neuropathy, possible need for additional procedures.  He was aware of these risks and agreed to proceed forward.  DESCRIPTION OF OPERATION:  After full informed written consent was obtained from the patient, he was brought back to the operating room, placed in supine upon the operating table.  Prior to induction, he received IV antibiotics.  He was then prepped and draped in standard fashion for left arm access procedure.  I turned my attention first  to the antecubitum where there was an easily palpable thrill and made a longitudinal incision over the basilic vein at this level.  I dissected down to it and then this allowed me to determine the direction of this basilic vein.  I then extended this incision more proximally and then dissected out the vein in this process.  I continued this process until I basically visualized the vein from the antecubitum all the way up to the axilla.  I then took by time to dissect out this vein, taking care to avoiding injury to any adjacent nerves.  I found 3 side branches that were ligated to improve the drainage through this basilic vein.  There was 1 area of narrowing that was near at branch point, I ligated the branch point and lysed the connective tissue around this vein and was able to fully distend the vein up to an 8-10 mm diameter at this location where previously it was only 4-5 mm in diameter, so at this point I had complete mobilization of this basilic vein and I examined the adjacent nerve, it was still intact.  I then dissected a skin flap right on top of the fascial layer of the biceps muscle and then  I dissected a groove in the subcutaneous tunnel laterally to house this basilic vein.  The vein was then transposed laterally into this groove. I tacked this vein in place with 5 interrupted stitches of 2-0 Vicryl. At this point, there was easily palpable thrill and the level of the skin on top of the basilic vein well way from the nerve.  At this point, we irrigated out the surgical field.  A few bleeding points were controlled with electrocautery.  The subcutaneous tissue was then reapproximated with running stitch of 3-0 Vicryl.  I then briefly checked for a pulse at the wrist.  There was a palpable radial pulse that had a biphasic signal on the continuous Doppler.  There is also easily palpable thrill on the skin.  The skin was then reapproximated using 2 running stitches of 4-0  Monocryl in a running subcuticular fashion, tying in the middle.  Skin was then cleaned, dried, and reinforced with Dermabond.  COMPLICATIONS:  None.  CONDITION:  Stable.     Fransisco Hertz, MD     BLC/MEDQ  D:  10/06/2010  T:  10/06/2010  Job:  [id number]  Electronically Signed by Leonides Sake MD on 10/19/2010 10:38:07 AM

## 2010-10-22 ENCOUNTER — Ambulatory Visit: Payer: Medicare Other

## 2010-10-28 ENCOUNTER — Ambulatory Visit (INDEPENDENT_AMBULATORY_CARE_PROVIDER_SITE_OTHER): Payer: Medicare Other

## 2010-10-28 DIAGNOSIS — N186 End stage renal disease: Secondary | ICD-10-CM

## 2010-10-28 NOTE — Assessment & Plan Note (Unsigned)
OFFICE VISIT  TACUMA, GRAFFAM DOB:  [birth year]                                       10/28/2010 ZOXWR#:[id number]  DATE OF SURGERY:  10/06/2010.  REFERRING PHYSICIAN:  Dr. Arrie Aran.  HISTORY OF PRESENT ILLNESS:  This is a 70 year old gentleman who had a first stage basilic vein transposition on August 17, 2010.  He underwent the second stage of his left basilic vein transposition on Oct 06, 2010 and presents today for his postoperative visit.  He denies any steal symptom symptomatology.  He is able to complete his activities of daily living.  PHYSICAL EXAMINATION:  Today his blood pressure is 142/77, heart rate of 65, and he is 98% O2 on room air.  On examination today, his left arm does have a strong thrill throughout the basilic vein distribution.  He has 2+ radial and ulnar pulses on the left.  He does have some discoloration around the basilic vein site, but this seems to be improving, per the patient.  He denies any fevers.  His incision is clean, dry, and intact and healing well.  MEDICAL DECISION MAKING:  This is a 70 year old gentleman who is status post second stage basilic vein transposition on the left that is successful.  There is a strong thrill, and the patient will be able to use this access in 2 weeks from now.  Newton Pigg, PA  Charles E. Fields, MD  SE/MEDQ  D:  10/28/2010  T:  10/28/2010  Job:  [id number]  cc:   Terrial Rhodes, M.D.

## 2010-11-05 ENCOUNTER — Ambulatory Visit (INDEPENDENT_AMBULATORY_CARE_PROVIDER_SITE_OTHER): Payer: Medicare Other

## 2010-11-05 ENCOUNTER — Encounter (INDEPENDENT_AMBULATORY_CARE_PROVIDER_SITE_OTHER): Payer: Medicare Other

## 2010-11-05 DIAGNOSIS — T82898A Other specified complication of vascular prosthetic devices, implants and grafts, initial encounter: Secondary | ICD-10-CM

## 2010-11-05 DIAGNOSIS — Z48812 Encounter for surgical aftercare following surgery on the circulatory system: Secondary | ICD-10-CM

## 2010-11-05 NOTE — Assessment & Plan Note (Signed)
OFFICE VISIT  Adam, Walsh DOB:  02-03-1941                                       11/05/2010 ZOXWR#:[id number]  Date of surgery is 10/06/2010.  The patient is a 70 year old male who presents today for evaluation of ecchymosis and possible hematoma.  He is very irritated and presented today without an appointment demanding to be seen.  He states that since his surgery on 10/06/2010 he has had persistent bruising which has worsened over the past day and a half.  The patient underwent a second stage left vein transposition on 10/06/2010.  He was evaluated at a postoperative visit on 10/28/2010 at which time he denied any steal symptoms and had some improving discoloration around the basilic vein site.  He states that he has had continued bruising surrounding the incision sites and that beginning yesterday he noted extension of the bruising laterally across his arm.  The fistula has not been utilized on dialysis and he is currently dialyzing via Diatek catheter on Monday, Wednesday, Friday.  The patient states that he has not done anything that he can think of to damage his arm or the fistula.  He states he has not been applying pressure to the fistula nor has he had anything pressing against it or resting on it.  He did tell the vascular lab staff that he has been mowing the yard and doing other physically active chores around at home.  He continues to deny signs and symptoms of steal and he states that his upper arm is not painful or tender.  PHYSICAL EXAMINATION:  The patient is resting comfortably and in no acute distress.  He has a strong thrill throughout the AV fistula in the left upper arm.  There is extensive ecchymosis that is almost circumferential around the upper portion of the left arm.  None of the area is tender to the touch.  There is no frank hematoma palpated.  The incision is healing well.  There is no evidence of skin breakdown  or drainage or cellulitis.  There is a 2+ left radial pulse.  Motor and sensation are intact in the left hand.  The patient was taken to the peripheral vascular lab for a duplex of the fistula.  This revealed no active bleeding.  However, there was evidence of old hematoma at the distal portion of the fistula as well as evidence of new clot surrounding the upper portion of the fistula.  Again, no active bleeding was noted.  ASSESSMENT AND PLAN:  The patient refused to stay for evaluation by myself or Dr. Imogene Burn as he stated he was late for dialysis and had to leave.  At this point the patient has a dialysis catheter and he has no nerve compromise in the left hand.  The patient will be contacted to follow up with Dr. Imogene Burn in 2 weeks or sooner if he begins to have any new symptoms for continued bleeding.  Pecola Leisure, [place]  Fransisco Hertz, MD Electronically Signed  AY/MEDQ  D:  11/05/2010  T:  11/05/2010  Job:  [id number]

## 2010-11-09 NOTE — Procedures (Unsigned)
VASCULAR LAB EXAM  INDICATION:  Evaluate left arm, status post second stage basilic vein transposition.  HISTORY: Diabetes:  No Cardiac:  No Hypertension:  No  EXAM:  Evaluation of left upper arm, status post basilic vein transposition.  IMPRESSION: 1. A hematoma measuring 3.48 cm x 4.12 cm was noted in the left upper     arm. 2. The left basilic vein is patent with no bleeding visualized into     the hematoma. 3. The proximal and mid forearm segment of the basilic vein can be     seen coursing through the hematoma.  Results were discussed with Dr. Johny Drilling prior to the patient leaving.  ___________________________________________ Fransisco Hertz, MD  EM/MEDQ  D:  11/05/2010  T:  11/05/2010  Job:  [id number]

## 2010-11-19 ENCOUNTER — Ambulatory Visit (INDEPENDENT_AMBULATORY_CARE_PROVIDER_SITE_OTHER): Payer: Medicare Other

## 2010-11-19 DIAGNOSIS — N186 End stage renal disease: Secondary | ICD-10-CM

## 2010-11-19 NOTE — H&P (Signed)
HISTORY AND PHYSICAL EXAMINATION  November 19, 2010  Re:  Adam Walsh, Adam Walsh               DOB:  1940-08-07  DIAGNOSES:  Hematoma and bruising at left basilic vein transposition site.  HISTORY OF PRESENT ILLNESS:  This is a 70 year old gentleman who is on dialysis via indwelling catheter who previously had a left basilic vein transposition completed and it has now matured adequately.  He underwent a second stage basilic vein transposition on 10/06/2010.  He has followed up in our office several times since then with complaints of bruising and pain around the incision site.  He denies any evidence of steal.  He has no numbness in his hand or any pain and is able to use his left hand.  The pain in his upper arm is now moving down into his lower arm and he has significant ecchymosis in his left upper arm that is circumferential.  He does have a strong thrill and 2+ radial pulse on the left.  PAST MEDICAL AND SURGICAL HISTORY: 1. End-stage renal disease.     a.     Status post basilic vein transposition 10/06/2010.     b.     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 2. History of heparin induced thrombocytopenia. 3. History of myasthenia gravis that is now in remission. 4. Hypertension. 5. History of pyelonephritis. 6. History of nephrolithiasis. 7. Celiac sprue disease. 8. Placement of right internal jugular tunnel dialysis catheter. 9. History of exploratory laparotomy. 10.TURP. 11.Excision of some type of right thumb mass. 12.History of cholecystectomy.  ALLERGIES: 1. Heparin. 2. Avelox. 3. Gluten.  CURRENT MEDICATIONS: 1. Omeprazole 20 mg p.o. daily. 2. Calcium one p.o. t.i.d. with meals. 3. Renal vitamin p.o. daily. 4. Aspirin 81 mg p.o. daily.  SOCIAL HISTORY:  The patient lives with his wife.  He denies any EtOH or illicit drug use and he has remote tobacco use greater than 30 years.  FAMILY HISTORY:  Father died of old age.  His mother's history  is unknown.  REVIEW OF SYSTEMS:  GENERAL:  He denies any fever, chills or weight loss. VASCULAR:  He has pain in his left upper extremity that is radiating down into his lower arm as well as circumferential bruising.  He denies any history of stroke or mini stroke or amaurosis fugax. CARDIAC:  He states that he had some chest tightness at hemodialysis but he also has dizziness and headaches at dialysis as well. GASTROINTESTINAL:  He denies any melena or peptic ulcer disease.  He does have GERD. NEUROLOGICAL:  He has headaches and dizziness at hemodialysis, otherwise negative. PULMONARY:  He denies any shortness of breath or hemoptysis. HEMATOLOGIC:  He does have history of heparin induced thrombocytopenia. URINARY:  He denies any dysuria or hematuria. HEENT:  He denies any changes in his eyesight or hearing.  He denies any nosebleeds. MUSCULOSKELETAL:  Denies arthritis or joint pain. PSYCHIATRIC:  He denies any depression or anxiety.  PHYSICAL EXAM:  Vital signs:  Blood pressure in the right arm is 134/112, however, the patient was talking and somewhat upset while the blood pressure cuff was inflated and was unable to obtain a second pressure.  Heart rate 77, O2 saturation 98% on room air, respirations are 14.  General:  He is in no acute distress.  HEENT:  Pupils are equal, round, reactive to light.  Conjunctivae normal.  Lungs:  Were clear to auscultation bilaterally.  Cardiovascular:  No carotid bruits. His  heart was regular rate and rhythm.  No murmurs.  Abdomen:  Soft, nontender, nondistended with positive bowel sounds.  Musculoskeletal: There were no major deformities.  Neuro:  There were no focal defects. No carotid bruits were heard.  Skin:  He does have significant ecchymosis and swelling around his left basilic vein transposition surgical site.  He has 2+ radial pulse and a good thrill and no evidence of steal symptoms.  There was no edema in his bilateral  lower extremities.  ASSESSMENT:  This is a 70 year old male who is status post second stage basilic vein transposition who presents with significant ecchymosis and swelling of the basilic vein transposition surgical site.  Dr. Imogene Burn did come in and evaluate the patient and the plan is to take him back to the operating room for exploration of the surgical site and evacuation of hematoma on the left upper extremity.  Newton Pigg, [place]  Fransisco Hertz, MD Electronically Signed  SE/MEDQ  D:  11/19/2010  T:  11/19/2010  Job:  [id number]

## 2010-11-22 ENCOUNTER — Ambulatory Visit (HOSPITAL_COMMUNITY): Payer: Medicare Other

## 2010-11-22 ENCOUNTER — Ambulatory Visit (HOSPITAL_COMMUNITY)
Admission: RE | Admit: 2010-11-22 | Discharge: 2010-11-22 | Disposition: A | Discharge status: Home / Self Care | Payer: Medicare Other | Source: Ambulatory Visit | Attending: Vascular Surgery | Admitting: Vascular Surgery

## 2010-11-22 DIAGNOSIS — K900 Celiac disease: Secondary | ICD-10-CM | POA: Insufficient documentation

## 2010-11-22 DIAGNOSIS — Z01818 Encounter for other preprocedural examination: Secondary | ICD-10-CM | POA: Insufficient documentation

## 2010-11-22 DIAGNOSIS — IMO0002 Reserved for concepts with insufficient information to code with codable children: Secondary | ICD-10-CM | POA: Insufficient documentation

## 2010-11-22 DIAGNOSIS — I120 Hypertensive chronic kidney disease with stage 5 chronic kidney disease or end stage renal disease: Secondary | ICD-10-CM

## 2010-11-22 DIAGNOSIS — Z01812 Encounter for preprocedural laboratory examination: Secondary | ICD-10-CM | POA: Insufficient documentation

## 2010-11-22 DIAGNOSIS — N186 End stage renal disease: Secondary | ICD-10-CM

## 2010-11-22 DIAGNOSIS — T82898A Other specified complication of vascular prosthetic devices, implants and grafts, initial encounter: Secondary | ICD-10-CM

## 2010-11-22 DIAGNOSIS — G7000 Myasthenia gravis without (acute) exacerbation: Secondary | ICD-10-CM | POA: Insufficient documentation

## 2010-11-22 DIAGNOSIS — Y838 Other surgical procedures as the cause of abnormal reaction of the patient, or of later complication, without mention of misadventure at the time of the procedure: Secondary | ICD-10-CM | POA: Insufficient documentation

## 2010-11-22 LAB — POCT I-STAT 4, (NA,K, GLUC, HGB,HCT): Hemoglobin: 12.2 g/dL — ABNORMAL LOW (ref 13.0–17.0)

## 2010-11-22 LAB — SURGICAL PCR SCREEN: MRSA, PCR: NEGATIVE

## 2010-11-23 HISTORY — PX: HEMATOMA EVACUATION: SHX5118

## 2010-11-29 NOTE — Op Note (Signed)
NAME:  Adam Walsh, Adam Walsh NO.:  [id number]  MEDICAL RECORD NO.:  [id number]  LOCATION:  [facility]SC                         FACILITY:  [facility]  PHYSICIAN:  Fransisco Hertz, MD       DATE OF BIRTH:  1941-04-30  DATE OF PROCEDURE:  11/22/2010 DATE OF DISCHARGE:  11/22/2010                              OPERATIVE REPORT   PROCEDURE:  Evacuation hematoma, left upper arm.  PREOPERATIVE DIAGNOSIS:  Left arm hematoma status post left second stage basilic vein transposition.  POSTOPERATIVE DIAGNOSIS:  Left arm hematoma status post left second stage basilic vein transposition.  SURGEON:  Fransisco Hertz, MD.  ANESTHESIA:  General.  FINDINGS:  Included a hematoma in the left upper arm.  No obvious bleeding noted in the surgical wound.  At the end of case, palpable thrill throughout this transposed basilic vein transposition.  SPECIMENS:  None.  ESTIMATED BLOOD LOSS:  Minimal.  INDICATIONS:  This is a 70 year old gentleman that previously underwent a staged basilic vein transposition.  Unfortunately over the last couple of weeks, has noted increased swelling in the postoperative period, now about 4 weeks out from his basilic vein transposition.  On a previous ultrasound, it demonstrated hematoma in the upper arm, however, it did not improve with time, so the patient thinks that has been growing in volume.  Subsequently, I felt that exploration of the upper arm was indicated.  At this point, the patient is aware of the risks of this procedure to include possible infection at the hematoma, possible nerve damage, possible thrombosis of the basilic vein transposition.  He is aware of these risks and agreed to proceed forward.  DESCRIPTION OF THE OPERATION:  After full informed written consent had been obtained from the patient, he was brought back to the operating room, placed supine upon the operating table, and was given IV antibiotics prior to proceeding.  After obtaining  adequate anesthesia, I turned my attention to his left upper arm, made an incision in the middle half of this incision.  Using blunt dissection and electrocautery, I developed a plane down into the hematoma cavity. I took some extra time to control the skin edge bleeding.  I then opened up this hematoma cavity and extracted out about 50 mL worth of clot.  This hematoma was noted in the bed of the harvested basilic vein transposition and extending adjacent to the biceps muscle.  There was no active bleeding throughout this wound.  I irrigated it out.  I then extracted as much clot as I felt could be safely obtained, placed large thrombin soaked gel foam pads in this wound, applied gentle pressure to the wound entirety of this wound, then removed the gel foam pads and then irrigated out this wound.  There was no more active bleeding.  I put a few interrupted stitches, reapproximating the deep subcu tissue with 3-0 to try to obliterate some of the deep space and then reapproximated the subcu tissue with a running stitch of 3-0 Vicryl in a loose fashion, so that if there was development of any additional hematoma, it would drain.  I then at this point reapproximated the skin with skin staples, also  to facilitate drainage if any additional hematoma were to form.  A sterile 4x4s were applied to this wound after cleaning the arm and then the was held in place with sterile Kerlix wrapped around the upper arm.  At the end of this case, there remained a easily palpable thrill in the upper arm and also I verified that the patency of the entirety of this fistula flow with a continuous Doppler, there was no change from the beginning of this case and the flow signature in this fistula.  The patient tolerated the procedure well and was allowed to awaken.  COMPLICATIONS:  None.  CONDITION:  Stable.     Fransisco Hertz, MD     BLC/MEDQ  D:  11/22/2010  T:  11/23/2010  Job:   [id number]  Electronically Signed by Leonides Sake MD on 11/29/2010 11:25:25 AM

## 2010-12-03 ENCOUNTER — Encounter: Payer: Self-pay | Admitting: Vascular Surgery

## 2010-12-08 NOTE — Progress Notes (Signed)
[facility] [place]  Postoperative Access Visit  History of Present Illness  Adam Walsh is a 70 y.o. year old male who presents for postoperative follow-up for: LUA hematoma evacuation (Date: 11/22/2010).  The patient's wounds are healed.  The patient notes no steal symptoms.  The patient is able to complete their activities of daily living.  The patient's current symptoms are: none.  Physical Examination  Filed Vitals:   12/10/10 1047  BP: 165/81  Pulse: 74  Temp: 98.1 F (36.7 C)   LUE: Incision is healed, skin feels warm, hand grip is 5/5, sensation in digits is intact, easily palpable thrill  Medical Decision Making  Adam Walsh is a 70 y.o. year old male who presents s/p LUA hematoma evacuation.  The patient's access is ready for use.  The left arm staples will be removed today.  The patient's tunneled dialysis catheter can be removed after two successful cannulations and completed dialysis treatments.  Thank you for allowing [place] to participate in this patient's care.  Leonides Sake, MD [facility] [place] Office: [phone] Pager: [phone]

## 2010-12-10 ENCOUNTER — Encounter: Payer: Self-pay | Admitting: Vascular Surgery

## 2010-12-10 ENCOUNTER — Ambulatory Visit (INDEPENDENT_AMBULATORY_CARE_PROVIDER_SITE_OTHER): Payer: Medicare Other | Admitting: Vascular Surgery

## 2010-12-10 VITALS — BP 165/81 | HR 74 | Temp 98.1°F

## 2010-12-10 DIAGNOSIS — N186 End stage renal disease: Secondary | ICD-10-CM

## 2010-12-10 NOTE — Progress Notes (Signed)
Postop Left upper arm hematoma evacuation on 11-22-10

## 2010-12-27 ENCOUNTER — Other Ambulatory Visit (HOSPITAL_COMMUNITY): Payer: Self-pay | Admitting: Nephrology

## 2010-12-27 DIAGNOSIS — N186 End stage renal disease: Secondary | ICD-10-CM

## 2010-12-30 ENCOUNTER — Ambulatory Visit (HOSPITAL_COMMUNITY)
Admission: RE | Admit: 2010-12-30 | Discharge: 2010-12-30 | Disposition: A | Discharge status: Home / Self Care | Payer: Medicare Other | Source: Ambulatory Visit | Attending: Nephrology | Admitting: Nephrology

## 2010-12-30 DIAGNOSIS — K900 Celiac disease: Secondary | ICD-10-CM | POA: Insufficient documentation

## 2010-12-30 DIAGNOSIS — N186 End stage renal disease: Secondary | ICD-10-CM | POA: Insufficient documentation

## 2010-12-30 DIAGNOSIS — I120 Hypertensive chronic kidney disease with stage 5 chronic kidney disease or end stage renal disease: Secondary | ICD-10-CM | POA: Insufficient documentation

## 2010-12-30 DIAGNOSIS — Z4901 Encounter for fitting and adjustment of extracorporeal dialysis catheter: Secondary | ICD-10-CM | POA: Insufficient documentation

## 2011-09-16 ENCOUNTER — Other Ambulatory Visit (HOSPITAL_COMMUNITY): Payer: Self-pay | Admitting: Nephrology

## 2011-09-16 DIAGNOSIS — N186 End stage renal disease: Secondary | ICD-10-CM

## 2011-09-20 ENCOUNTER — Other Ambulatory Visit (HOSPITAL_COMMUNITY): Payer: Self-pay | Admitting: Nephrology

## 2011-09-20 ENCOUNTER — Ambulatory Visit (HOSPITAL_COMMUNITY)
Admission: RE | Admit: 2011-09-20 | Discharge: 2011-09-20 | Disposition: A | Discharge status: Home / Self Care | Payer: Medicare Other | Source: Ambulatory Visit | Attending: Nephrology | Admitting: Nephrology

## 2011-09-20 DIAGNOSIS — Y849 Medical procedure, unspecified as the cause of abnormal reaction of the patient, or of later complication, without mention of misadventure at the time of the procedure: Secondary | ICD-10-CM | POA: Insufficient documentation

## 2011-09-20 DIAGNOSIS — N186 End stage renal disease: Secondary | ICD-10-CM | POA: Insufficient documentation

## 2011-09-20 DIAGNOSIS — T82898A Other specified complication of vascular prosthetic devices, implants and grafts, initial encounter: Secondary | ICD-10-CM | POA: Insufficient documentation

## 2011-09-20 DIAGNOSIS — I120 Hypertensive chronic kidney disease with stage 5 chronic kidney disease or end stage renal disease: Secondary | ICD-10-CM | POA: Insufficient documentation

## 2011-09-20 DIAGNOSIS — Z992 Dependence on renal dialysis: Secondary | ICD-10-CM | POA: Insufficient documentation

## 2011-09-20 MED ORDER — IOHEXOL 300 MG/ML  SOLN
100.0000 mL | Freq: Once | INTRAMUSCULAR | Status: AC | PRN
  Administered 2011-09-20: 50 mL via INTRAVENOUS

## 2011-09-20 NOTE — Procedures (Signed)
Left arm fistulogram raised concern for two focal stenoses in the left arm.  These areas were treated with 6 mm angioplasty balloon.  Minimal waist formation and the narrowings may be associated with valves.  Follow-up fistulogram demonstrates slightly improved flow.  No immediate complication.

## 2011-09-20 NOTE — H&P (Signed)
Adam Walsh is an 71 y.o. male.   Chief Complaint: stenosis of left upper arm AVF noted by fistulogram.  HPI: Initially created 07/2010, followed by basilic vein transposition 09/2010 then hematoma evacuation 10/2010.  No other issues per patient since that time with this access.   Past Medical History  Diagnosis Date  . Renal insufficiency   . Hemodialysis patient     Monday, Wednesday and Friday  . HIT (heparin-induced thrombocytopenia)   . Myasthenia gravis   . Hypertension   . History of pyelonephritis   . History of nephrolithiasis   . Celiac sprue     Past Surgical History  Procedure Date  . Exploratory laparotomy   . Transurethral resection of prostate   . Cholecystectomy   . Av fistula placement, radiocephalic 08/03/10    Left  . Basilic vein transposition 10/06/10    2nd stage Left arm  . Hematoma evacuation 11/23/10    Left Arm     Social History:  reports that he quit smoking about 42 years ago. His smoking use included Cigarettes. He does not have any smokeless tobacco history on file. He reports that he does not drink alcohol or use illicit drugs.  Allergies:  Allergies  Allergen Reactions  . Avelox (Moxifloxacin Hcl In Nacl)   . Gluten   . Heparin     Review of Systems  Constitutional: Negative for fever, chills and weight loss.  Respiratory: Negative.   Cardiovascular: Negative.   Gastrointestinal: Negative.   Musculoskeletal:       Hx of myasthenia gravis   Neurological: Negative.   Endo/Heme/Allergies: Bruises/bleeds easily.  Psychiatric/Behavioral: Negative.     Physical Exam  Constitutional: He is oriented to person, place, and time. He appears well-developed and well-nourished. No distress.  HENT:  Head: Normocephalic and atraumatic.  Cardiovascular: Normal rate, regular rhythm and normal heart sounds.  Exam reveals no gallop and no friction rub.   No murmur heard. Respiratory: Effort normal and breath sounds normal. No respiratory  distress.  GI: Bowel sounds are normal. He exhibits no distension.  Musculoskeletal: Normal range of motion. He exhibits no edema.  Neurological: He is alert and oriented to person, place, and time.  Skin: Skin is warm and dry. He is not diaphoretic.  Psychiatric: He has a normal mood and affect. His behavior is normal. Judgment and thought content normal.     Assessment/Plan Patient talked with in detail regarding pta/stenting for stenosis identified on fistulogram.  Procedure details and risks including but not limited to infection, bleeding, vessel damage and complications with moderate sedation (if elected) with the patient's apparent understanding.  Written consent obtained.      Adam Walsh D 09/20/2011, 1:56 PM

## 2012-04-29 ENCOUNTER — Emergency Department (HOSPITAL_COMMUNITY)
Admission: EM | Admit: 2012-04-29 | Discharge: 2012-04-29 | Disposition: A | Discharge status: Home / Self Care | Payer: Medicare Other | Attending: Emergency Medicine | Admitting: Emergency Medicine

## 2012-04-29 ENCOUNTER — Emergency Department (HOSPITAL_COMMUNITY): Payer: Medicare Other

## 2012-04-29 ENCOUNTER — Encounter (HOSPITAL_COMMUNITY): Payer: Self-pay | Admitting: Nurse Practitioner

## 2012-04-29 DIAGNOSIS — I120 Hypertensive chronic kidney disease with stage 5 chronic kidney disease or end stage renal disease: Secondary | ICD-10-CM | POA: Insufficient documentation

## 2012-04-29 DIAGNOSIS — Z79899 Other long term (current) drug therapy: Secondary | ICD-10-CM | POA: Insufficient documentation

## 2012-04-29 DIAGNOSIS — Z7982 Long term (current) use of aspirin: Secondary | ICD-10-CM | POA: Insufficient documentation

## 2012-04-29 DIAGNOSIS — Z87891 Personal history of nicotine dependence: Secondary | ICD-10-CM | POA: Insufficient documentation

## 2012-04-29 DIAGNOSIS — F329 Major depressive disorder, single episode, unspecified: Secondary | ICD-10-CM

## 2012-04-29 DIAGNOSIS — Z87448 Personal history of other diseases of urinary system: Secondary | ICD-10-CM | POA: Insufficient documentation

## 2012-04-29 DIAGNOSIS — N186 End stage renal disease: Secondary | ICD-10-CM | POA: Insufficient documentation

## 2012-04-29 DIAGNOSIS — Z992 Dependence on renal dialysis: Secondary | ICD-10-CM | POA: Insufficient documentation

## 2012-04-29 DIAGNOSIS — R109 Unspecified abdominal pain: Secondary | ICD-10-CM | POA: Insufficient documentation

## 2012-04-29 DIAGNOSIS — Z87442 Personal history of urinary calculi: Secondary | ICD-10-CM | POA: Insufficient documentation

## 2012-04-29 DIAGNOSIS — G7000 Myasthenia gravis without (acute) exacerbation: Secondary | ICD-10-CM | POA: Insufficient documentation

## 2012-04-29 DIAGNOSIS — R252 Cramp and spasm: Secondary | ICD-10-CM | POA: Insufficient documentation

## 2012-04-29 DIAGNOSIS — F3289 Other specified depressive episodes: Secondary | ICD-10-CM | POA: Insufficient documentation

## 2012-04-29 HISTORY — PX: CARDIAC CATHETERIZATION: SHX172

## 2012-04-29 LAB — POCT I-STAT, CHEM 8
Chloride: 105 mEq/L (ref 96–112)
Glucose, Bld: 87 mg/dL (ref 70–99)
HCT: 31 % — ABNORMAL LOW (ref 39.0–52.0)
Potassium: 4.3 mEq/L (ref 3.5–5.1)

## 2012-04-29 LAB — URINE MICROSCOPIC-ADD ON

## 2012-04-29 LAB — URINALYSIS, ROUTINE W REFLEX MICROSCOPIC
Leukocytes, UA: NEGATIVE
Nitrite: NEGATIVE
Specific Gravity, Urine: 1.01 (ref 1.005–1.030)
pH: 8.5 — ABNORMAL HIGH (ref 5.0–8.0)

## 2012-04-29 LAB — CBC
HCT: 30.6 % — ABNORMAL LOW (ref 39.0–52.0)
Platelets: 169 10*3/uL (ref 150–400)
RDW: 14.3 % (ref 11.5–15.5)
WBC: 6.5 10*3/uL (ref 4.0–10.5)

## 2012-04-29 MED ORDER — IOHEXOL 300 MG/ML  SOLN
100.0000 mL | Freq: Once | INTRAMUSCULAR | Status: AC | PRN
  Administered 2012-04-29: 100 mL via INTRAVENOUS

## 2012-04-29 MED ORDER — CLONAZEPAM 0.5 MG PO TABS
1.0000 mg | ORAL_TABLET | Freq: Once | ORAL | Status: AC
  Administered 2012-04-29: 1 mg via ORAL
  Filled 2012-04-29 (×2): qty 1

## 2012-04-29 MED ORDER — IOHEXOL 300 MG/ML  SOLN
20.0000 mL | INTRAMUSCULAR | Status: AC
  Administered 2012-04-29: 20 mL via ORAL

## 2012-04-29 MED ORDER — TRAMADOL HCL 50 MG PO TABS: 50.0000 mg | ORAL_TABLET | Freq: Three times a day (TID) | ORAL | Status: DC | PRN

## 2012-04-29 MED ORDER — CLONAZEPAM 0.5 MG PO TABS: 0.5000 mg | ORAL_TABLET | Freq: Two times a day (BID) | ORAL | Status: DC | PRN

## 2012-04-29 MED ORDER — CLONAZEPAM 0.5 MG PO TABS: 0.5000 mg | ORAL_TABLET | Freq: Once | ORAL | Status: DC

## 2012-04-29 NOTE — ED Provider Notes (Signed)
5:13 PM  Pt set for dc by previous provider after abdominal work up was negative. Discussed results w pt and his wife who have informed me that the the pt was actually here for depression/anxiety that has been worsening over the last couple of weeks. He describes that he has been emotionally labile and that he cries at random. He has not seen a therapist for this, but states that he plans to get a referral from his PCP. Pt had similar episode years ago that resolved on its own. Pt denies any suicidal thoughts. Pt presents with a number of risk factors for suicide including depression,  insomnia, & chronic disease.  In addition the patient has a number of protective factors for example the patient does not appear to be psychotic, is here voluntarily, is speaking openly about their current situation, discusses future plans & he has a good support system. Under these circumstances I would conservatively estimate the suicide risk to be low. Current Plan is to give pt a resource guide and a short Rx for klonopin to be used solely as rescue medication.  We have discussed that If the patient feels he was becoming unsafe, instead of acting on an impulse of self harm he will contact the crisis line, speak to his wife about it, or return to the emergency department. Patient & his wife are agreeable with plan and state they will follow up w therapist.    Jaci Carrel, PA-C 04/29/12 1719

## 2012-04-29 NOTE — ED Notes (Addendum)
Pt states over the past 2 weeks "ive just been falling apart." pt began crying in triage and states "im just so worried, i dont know whats wrong."states he just doesn't feel well. Denies SI/HI. Pt is on dialysis at home every other day, states he has noticed during treatments recently he has been having abd burning. Reports poor appetite. Pt is A&Ox4

## 2012-04-29 NOTE — ED Provider Notes (Signed)
Medical screening examination/treatment/procedure(s) were conducted as a shared visit with non-physician practitioner(s) and myself.  I personally evaluated the patient during the encounter Pt with worsening abdominal pain over last few weeks.  Diffuse.  No n/v.  No fevers.  Will check CT, if neg, can likely go home  Rolan Bucco, MD 04/29/12 1523

## 2012-04-29 NOTE — ED Notes (Signed)
Pt drinking contrast. 

## 2012-04-29 NOTE — ED Provider Notes (Signed)
History     CSN: [id number]  Arrival date & time 04/29/12  1145   First MD Initiated Contact with Patient 04/29/12 1239      Chief Complaint  Patient presents with  . Anxiety    (Consider location/radiation/quality/duration/timing/severity/associated sxs/prior treatment) HPI Adam Walsh is a 71 y.o. male complaining of decreased by mouth intake, abdominal discomfort described as a burning sensation. Patient also describes a nocturnal leg cramping that has been getting worse over the past several weeks. He also says that he feels his heart beat all over even in his toes. Patient denies any fever, chest pain, palpitations nausea or vomiting. Pt was constipated 1 week ago relieved with OTC Rx.  Pt has remote h/o anxiety. Patient is on home hemodialysis he is dialyzed every other day his wife is responsible for his dialysis she reports that he was fully dialyzed yesterday.  Past Medical History  Diagnosis Date  . Renal insufficiency   . Hemodialysis patient     Monday, Wednesday and Friday  . HIT (heparin-induced thrombocytopenia)   . Myasthenia gravis   . Hypertension   . History of pyelonephritis   . History of nephrolithiasis   . Celiac sprue     Past Surgical History  Procedure Date  . Exploratory laparotomy   . Transurethral resection of prostate   . Cholecystectomy   . Av fistula placement, radiocephalic 08/03/10    Left  . Basilic vein transposition 10/06/10    2nd stage Left arm  . Hematoma evacuation 11/23/10    Left Arm     History reviewed. No pertinent family history.  History  Substance Use Topics  . Smoking status: Former Smoker    Types: Cigarettes    Quit date: 05/30/1969  . Smokeless tobacco: Not on file  . Alcohol Use: No      Review of Systems  Constitutional: Negative for fever.  Respiratory: Negative for shortness of breath.   Cardiovascular: Negative for chest pain.  Gastrointestinal: Positive for abdominal pain. Negative for nausea,  vomiting and diarrhea.  All other systems reviewed and are negative.    Allergies  Avelox; Gluten; and Heparin  Home Medications   Current Outpatient Rx  Name  Route  Sig  Dispense  Refill  . ASPIRIN 81 MG PO TABS   Oral   Take 81 mg by mouth daily.           Marland Kitchen RENA-VITE PO   Oral   Take by mouth daily.           Marland Kitchen CALCIUM ACETATE 667 MG PO CAPS   Oral   Take 667 mg by mouth 3 (three) times daily with meals.           Marland Kitchen DOXYCYCLINE HYCLATE 100 MG PO CAPS               . OMEPRAZOLE 20 MG PO CPDR               . OMEPRAZOLE 40 MG PO CPDR   Oral   Take 40 mg by mouth daily.           Marland Kitchen PROMETHAZINE HCL 25 MG PO TABS   Oral   Take 25 mg by mouth every 6 (six) hours as needed.           Marland Kitchen TEMAZEPAM 7.5 MG PO CAPS   Oral   Take 7.5 mg by mouth at bedtime as needed.  BP 133/76  Temp 97.9 F (36.6 C) (Oral)  Resp 16  SpO2 97%  Physical Exam  Nursing note and vitals reviewed. Constitutional: He is oriented to person, place, and time. He appears well-developed and well-nourished. No distress.  HENT:  Head: Normocephalic.  Mouth/Throat: Oropharynx is clear and moist.  Eyes: Conjunctivae normal and EOM are normal. Pupils are equal, round, and reactive to light.  Neck: Normal range of motion.  Cardiovascular: Normal rate, regular rhythm, normal heart sounds and intact distal pulses.        Left AC fistula with good thrill.   Pulmonary/Chest: Effort normal and breath sounds normal. No stridor. No respiratory distress. He has no wheezes. He has no rales. He exhibits no tenderness.  Abdominal: Soft. He exhibits no distension and no mass. There is no tenderness. There is no rebound and no guarding.  Musculoskeletal: Normal range of motion.  Neurological: He is alert and oriented to person, place, and time.  Psychiatric: He has a normal mood and affect.    ED Course  Procedures (including critical care time)  Labs Reviewed  CBC -  Abnormal; Notable for the following:    RBC 3.12 (*)     Hemoglobin 10.3 (*)     HCT 30.6 (*)     All other components within normal limits  POCT I-STAT, CHEM 8 - Abnormal; Notable for the following:    BUN 36 (*)     Creatinine, Ser 6.60 (*)     Hemoglobin 10.5 (*)     HCT 31.0 (*)     All other components within normal limits  URINALYSIS, ROUTINE W REFLEX MICROSCOPIC   Dg Chest 2 View  04/29/2012  *RADIOLOGY REPORT*  Clinical Data: Anxiety.  CHEST - 2 VIEW  Comparison: Chest radiograph 11/22/2010  Findings: Heart size is within normal limits and stable. Mediastinal and hilar contours are stable.  Small (4 mm) oval radiopaque density projecting over the right upper lung likely reflects a retained cuff, after removal of prior dialysis catheter.  The lungs are clear.  There is no pleural effusion. Surgical clips are seen in the expected location gastroesophageal junction.  Cholecystectomy clips.  IMPRESSION: No acute cardiopulmonary disease.   Original Report Authenticated By: Britta Mccreedy, M.D.      1. End stage renal disease on dialysis   2. Abdominal discomfort   3. Muscle cramps       MDM  71 year old gentleman with ESRD on home hemodialysis coming in with crampy abdominal pain and decreased by mouth intake.  This is a shared visit with attending Dr. Fredderick Phenix.  Potassium is normal, chest x-ray and UA to show any signs of infection, mild anemia at 10.3 over 30.6.  CT abdomen pelvis will be ordered. Patient will be transferred to CDU pending results. Plan is to DC patient if there are no acute findings. Sign out given to NP Crawford in the CDU.          Wynetta Emery, PA-C 04/29/12 1510

## 2012-04-30 ENCOUNTER — Encounter: Payer: Self-pay | Admitting: Cardiovascular Disease

## 2012-04-30 NOTE — ED Provider Notes (Signed)
Medical screening examination/treatment/procedure(s) were performed by non-physician practitioner and as supervising physician I was immediately available for consultation/collaboration.   Rolan Bucco, MD 04/30/12 2130

## 2012-05-08 ENCOUNTER — Encounter: Payer: Self-pay | Admitting: Cardiovascular Disease

## 2012-05-09 DIAGNOSIS — I10 Essential (primary) hypertension: Secondary | ICD-10-CM | POA: Insufficient documentation

## 2012-05-09 DIAGNOSIS — K900 Celiac disease: Secondary | ICD-10-CM | POA: Insufficient documentation

## 2012-05-09 DIAGNOSIS — D7582 Heparin induced thrombocytopenia (HIT): Secondary | ICD-10-CM | POA: Insufficient documentation

## 2012-05-09 DIAGNOSIS — Z87442 Personal history of urinary calculi: Secondary | ICD-10-CM | POA: Insufficient documentation

## 2012-05-09 DIAGNOSIS — Z87448 Personal history of other diseases of urinary system: Secondary | ICD-10-CM | POA: Insufficient documentation

## 2012-05-09 DIAGNOSIS — Z992 Dependence on renal dialysis: Secondary | ICD-10-CM | POA: Insufficient documentation

## 2012-05-10 ENCOUNTER — Encounter: Payer: Self-pay | Admitting: Cardiovascular Disease

## 2012-05-10 ENCOUNTER — Ambulatory Visit (INDEPENDENT_AMBULATORY_CARE_PROVIDER_SITE_OTHER): Payer: Medicare Other | Admitting: Cardiovascular Disease

## 2012-05-10 VITALS — BP 139/66 | HR 59 | Wt 193.0 lb

## 2012-05-10 DIAGNOSIS — Z01810 Encounter for preprocedural cardiovascular examination: Secondary | ICD-10-CM

## 2012-05-10 DIAGNOSIS — Z01818 Encounter for other preprocedural examination: Secondary | ICD-10-CM

## 2012-05-10 DIAGNOSIS — K900 Celiac disease: Secondary | ICD-10-CM

## 2012-05-10 DIAGNOSIS — N186 End stage renal disease: Secondary | ICD-10-CM

## 2012-05-10 DIAGNOSIS — I10 Essential (primary) hypertension: Secondary | ICD-10-CM

## 2012-05-10 NOTE — Assessment & Plan Note (Signed)
Well controlled.  Continue current medications and low sodium Dash type diet.    

## 2012-05-10 NOTE — Assessment & Plan Note (Signed)
Particularly given advanced age will need myovue prior to transplant evaluation Lack of history , no calcium on CT and normal ECG would suggest he will be ok.  He thinks he can walk on treadmill.

## 2012-05-10 NOTE — Progress Notes (Signed)
Patient ID: Adam Walsh, male   DOB: 08/27/1940, 71 y.o.   MRN: [id number] 71 yo with CRF ? Evaluating for renal transplant and needs cardiac clearence.  Had CRF and acute oliguric exacerbation in 2012 with dialysis started.  Has had some issues with acces in LUE with revision and stenting of venous inflow needed.  VVS sees Dr Chen.  Reviewed CT chest done 11/11 and no coronary calcium Also has myesthenia followed by Dr Willis.  Had lid lag and had a tough time with Rx.  No history of CAD.  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  No dyspnea, LE edema palpitations NO history of CHF or valvular disease  ROS: Denies fever, malais, weight loss, blurry vision, decreased visual acuity, cough, sputum, SOB, hemoptysis, pleuritic pain, palpitaitons, heartburn, abdominal pain, melena, lower extremity edema, claudication, or rash.  All other systems reviewed and negative   General: Affect appropriate Healthy:  appears stated age HEENT: normal Neck supple with no adenopathy JVP normal no bruits no thyromegaly Lungs clear with no wheezing and good diaphragmatic motion Heart:  S1/S2 no murmur,rub, gallop or click PMI normal Abdomen: benighn, BS positve, no tenderness, no AAA  S/P exploratory lap and GB surgery no bruit.  No HSM or HJR Distal pulses intact with thrill in LUE fistula that has been revised numerous times No edema Neuro non-focal Skin warm and dry No muscular weakness  Medications Current Outpatient Prescriptions  Medication Sig Dispense Refill  . aspirin 81 MG tablet Take 162 mg by mouth daily.       . B Complex-C-Folic Acid (DIALYVITE 800 PO) Take 1 tablet by mouth daily.      . calcium acetate (PHOSLO) 667 MG capsule Take 1,334-2,668 mg by mouth See admin instructions. Takes 2 capsules with snacks and 4 capsules with meals      . clonazePAM (KLONOPIN) 0.5 MG tablet Take 1 tablet (0.5 mg total) by mouth 2 (two) times daily as needed for anxiety.  10 tablet  0  . Epoetin Alfa (EPOGEN IJ)  Inject as directed once a week.      . gabapentin (NEURONTIN) 300 MG capsule Take 300 mg by mouth at bedtime as needed. For feet/nerve pain      . paricalcitol (ZEMPLAR) 2 MCG/ML injection Inject 2 mcg into the vein 3 (three) times a week.      . traMADol (ULTRAM) 50 MG tablet Take 1 tablet (50 mg total) by mouth every 8 (eight) hours as needed for pain.  15 tablet  0    Allergies Avelox; Gluten; Ciprofloxacin; Erythromycin; Gemifloxacin; Gentamicin; Heparin; Kanamycin; Levofloxacin; Neomycin; Norfloxacin; Streptomycin; and Tobramycin  Family History: No family history on file.  Social History: History   Social History  . Marital Status: Married    Spouse Name: N/A    Number of Children: N/A  . Years of Education: N/A   Occupational History  . Not on file.   Social History Main Topics  . Smoking status: Former Smoker    Types: Cigarettes    Quit date: 05/30/1969  . Smokeless tobacco: Not on file  . Alcohol Use: No  . Drug Use: No  . Sexually Active:    Other Topics Concern  . Not on file   Social History Narrative  . No narrative on file    Electrocardiogram:  SR rate 59 normal  Assessment and Plan   

## 2012-05-10 NOTE — Assessment & Plan Note (Signed)
Continue low gluten diet Stable

## 2012-05-10 NOTE — Patient Instructions (Addendum)
Your physician recommends that you schedule a follow-up appointment in:  AS NEEDED  Your physician recommends that you continue on your current medications as directed. Please refer to the Current Medication list given to you today.  Your physician has requested that you have en exercise stress myoview. For further information please visit [email or web address] Please follow instruction sheet, as given. DX PRE OP

## 2012-05-10 NOTE — Assessment & Plan Note (Signed)
Does dialysis at home Still has urine output  F/U [facility] next week.  F/U [facility] for transplant evaluation and Dr Imogene Burn for vascular access

## 2012-05-17 ENCOUNTER — Ambulatory Visit (HOSPITAL_COMMUNITY): Payer: Medicare Other | Attending: Cardiovascular Disease | Admitting: Radiology

## 2012-05-17 VITALS — BP 130/61 | Ht 70.0 in | Wt 190.0 lb

## 2012-05-17 DIAGNOSIS — Z01818 Encounter for other preprocedural examination: Secondary | ICD-10-CM

## 2012-05-17 DIAGNOSIS — Z87891 Personal history of nicotine dependence: Secondary | ICD-10-CM | POA: Insufficient documentation

## 2012-05-17 DIAGNOSIS — R0789 Other chest pain: Secondary | ICD-10-CM | POA: Insufficient documentation

## 2012-05-17 DIAGNOSIS — R079 Chest pain, unspecified: Secondary | ICD-10-CM

## 2012-05-17 DIAGNOSIS — I10 Essential (primary) hypertension: Secondary | ICD-10-CM | POA: Insufficient documentation

## 2012-05-17 MED ORDER — TECHNETIUM TC 99M SESTAMIBI GENERIC - CARDIOLITE
11.0000 | Freq: Once | INTRAVENOUS | Status: AC | PRN
  Administered 2012-05-17: 11 via INTRAVENOUS

## 2012-05-17 MED ORDER — TECHNETIUM TC 99M SESTAMIBI GENERIC - CARDIOLITE
33.0000 | Freq: Once | INTRAVENOUS | Status: AC | PRN
  Administered 2012-05-17: 33 via INTRAVENOUS

## 2012-05-17 NOTE — Progress Notes (Signed)
[facility] SITE 3 NUCLEAR MED [address] [place], [place] [address] [phone]    Cardiology Nuclear Med Study  Adam Walsh is a 71 y.o. male     MRN : [id number]     DOB: 03/08/1941  Procedure Date: 05/17/2012  Nuclear Med Background Indication for Stress Test:  Evaluation for Ischemia and Surgical Clearance- Renal Transplant- Dr. Imogene Burn History:  No Hx of CAD, '11 CT NL Cardiac Risk Factors: History of Smoking and Hypertension  Symptoms:  Chest Tightness   Nuclear Pre-Procedure Caffeine/Decaff Intake:  None NPO After: 7:30am   Lungs:  clear O2 Sat: 94% on room air. IV 0.9% NS with Angio Cath:  20g  IV Site: R Antecubital  IV Started by:  Bonnita Levan, RN  Chest Size (in):  44 Cup Size: n/a  Height: 5\' 10"  (1.778 m)  Weight:  190 lb (86.183 kg)  BMI:  Body mass index is 27.26 kg/(m^2). Tech Comments:  N/A    Nuclear Med Study 1 or 2 day study: 1 day  Stress Test Type:  Stress  Reading MD: Olga Millers, MD  Order Authorizing Provider:  Charlton Haws, MD  Resting Radionuclide: Technetium 74m Sestamibi  Resting Radionuclide Dose: 11.0 mCi   Stress Radionuclide:  Technetium 53m Sestamibi  Stress Radionuclide Dose: 33.0 mCi           Stress Protocol Rest HR: 56 Stress HR: 123  Rest BP: 130/61 Stress BP: 219  Exercise Time (min): 6:00 METS: 7.0   Predicted Max HR: 150 bpm % Max HR: 82 bpm Rate Pressure Product: 21308    Dose of Adenosine (mg):  n/a Dose of Lexiscan: n/a mg  Dose of Atropine (mg): n/a Dose of Dobutamine: n/a mcg/kg/min (at max HR)  Stress Test Technologist: Milana Na, EMT-P  Nuclear Technologist:  Domenic Polite, CNMT     Rest Procedure:  Myocardial perfusion imaging was performed at rest 45 minutes following the intravenous administration of Technetium 32m Sestamibi. Rest ECG: Sinus bradycardia with no ST changes.  Stress Procedure:  The patient exercised on the treadmill utilizing the Bruce Protocol for 6:00 minutes.  The patient stopped due to fatigue and denied any chest pain.  Technetium 18m Sestamibi was injected at peak exercise and myocardial perfusion imaging was performed after a brief delay. Stress ECG: No significant ST segment change suggestive of ischemia.  QPS Raw Data Images:  Acquisition technically good; normal left ventricular size. Stress Images:  There is decreased uptake in the apex. Rest Images:  Normal homogeneous uptake in all areas of the myocardium. Subtraction (SDS):  These findings are consistent with probable apical thinning; cannot R/O minimal apical ischemia. Transient Ischemic Dilatation (Normal <1.22):  0.97 Lung/Heart Ratio (Normal <0.45):  0.32  Quantitative Gated Spect Images QGS EDV:  105 ml QGS ESV:  37 ml  Impression Exercise Capacity:  Fair exercise capacity. BP Response:  Normal blood pressure response. Clinical Symptoms:  No chest pain or dyspnea. ECG Impression:  No significant ST segment change suggestive of ischemia. Comparison with Prior Nuclear Study: No images to compare  Overall Impression:  Low risk stress nuclear study with a small, medium intensity, reversible apical defect most likely related to apical thinning; cannot rule out minimal apical ischemia; note patient did not achieve THR.  LV Ejection Fraction: 65%.  LV Wall Motion:  NL LV Function; NL Wall Motion  Olga Millers

## 2012-05-21 ENCOUNTER — Other Ambulatory Visit (INDEPENDENT_AMBULATORY_CARE_PROVIDER_SITE_OTHER): Payer: Medicare Other

## 2012-05-21 ENCOUNTER — Other Ambulatory Visit: Payer: Self-pay | Admitting: *Deleted

## 2012-05-21 ENCOUNTER — Encounter: Payer: Self-pay | Admitting: *Deleted

## 2012-05-21 DIAGNOSIS — Z01818 Encounter for other preprocedural examination: Secondary | ICD-10-CM

## 2012-05-21 DIAGNOSIS — R9439 Abnormal result of other cardiovascular function study: Secondary | ICD-10-CM

## 2012-05-21 LAB — BASIC METABOLIC PANEL
Calcium: 9 mg/dL (ref 8.4–10.5)
Creatinine, Ser: 7.9 mg/dL (ref 0.4–1.5)

## 2012-05-21 LAB — CBC WITH DIFFERENTIAL/PLATELET
Basophils Relative: 0.5 % (ref 0.0–3.0)
Eosinophils Absolute: 0.3 10*3/uL (ref 0.0–0.7)
Eosinophils Relative: 3.9 % (ref 0.0–5.0)
Lymphocytes Relative: 21 % (ref 12.0–46.0)
Neutrophils Relative %: 68.5 % (ref 43.0–77.0)
RBC: 2.88 Mil/uL — ABNORMAL LOW (ref 4.22–5.81)
WBC: 6.9 10*3/uL (ref 4.5–10.5)

## 2012-05-21 LAB — PROTIME-INR: INR: 1 ratio (ref 0.8–1.0)

## 2012-05-25 ENCOUNTER — Inpatient Hospital Stay (HOSPITAL_BASED_OUTPATIENT_CLINIC_OR_DEPARTMENT_OTHER)
Admission: RE | Admit: 2012-05-25 | Discharge: 2012-05-25 | Disposition: A | Discharge status: Home / Self Care | Payer: Medicare Other | Source: Ambulatory Visit | Attending: Cardiovascular Disease | Admitting: Cardiovascular Disease

## 2012-05-25 ENCOUNTER — Encounter (HOSPITAL_BASED_OUTPATIENT_CLINIC_OR_DEPARTMENT_OTHER)
Admission: RE | Disposition: A | Discharge status: Home / Self Care | Payer: Self-pay | Source: Ambulatory Visit | Attending: Cardiovascular Disease

## 2012-05-25 DIAGNOSIS — N189 Chronic kidney disease, unspecified: Secondary | ICD-10-CM | POA: Insufficient documentation

## 2012-05-25 DIAGNOSIS — R9439 Abnormal result of other cardiovascular function study: Secondary | ICD-10-CM | POA: Insufficient documentation

## 2012-05-25 DIAGNOSIS — R9430 Abnormal result of cardiovascular function study, unspecified: Secondary | ICD-10-CM

## 2012-05-25 DIAGNOSIS — Z992 Dependence on renal dialysis: Secondary | ICD-10-CM | POA: Insufficient documentation

## 2012-05-25 SURGERY — JV LEFT HEART CATHETERIZATION WITH CORONARY ANGIOGRAM
Anesthesia: Moderate Sedation

## 2012-05-25 MED ORDER — OXYCODONE-ACETAMINOPHEN 5-325 MG PO TABS: 1.0000 | ORAL_TABLET | ORAL | Status: DC | PRN

## 2012-05-25 MED ORDER — ONDANSETRON HCL 4 MG/2ML IJ SOLN: 4.0000 mg | Freq: Four times a day (QID) | INTRAMUSCULAR | Status: DC | PRN

## 2012-05-25 MED ORDER — ACETAMINOPHEN 325 MG PO TABS: 650.0000 mg | ORAL_TABLET | ORAL | Status: DC | PRN

## 2012-05-25 MED ORDER — SODIUM CHLORIDE 0.45 % IV SOLN: INTRAVENOUS | Status: AC

## 2012-05-25 MED ORDER — ASPIRIN EC 325 MG PO TBEC: 325.0000 mg | DELAYED_RELEASE_TABLET | Freq: Every day | ORAL | Status: DC

## 2012-05-25 MED ORDER — SODIUM CHLORIDE 0.9 % IV SOLN
INTRAVENOUS | Status: DC
  Administered 2012-05-25: 09:00:00 via INTRAVENOUS

## 2012-05-25 NOTE — OR Nursing (Signed)
Discharge instructions reviewed and signed, pt stated understanding, ambulated in hall without difficulty, site level 0, transported to wife's car via wheelchair 

## 2012-05-25 NOTE — H&P (View-Only) (Signed)
Patient ID: Adam Walsh, male   DOB: Sep 17, 1940, 71 y.o.   MRN: [id number] 71 yo with CRF ? Evaluating for renal transplant and needs cardiac clearence.  Had CRF and acute oliguric exacerbation in 2012 with dialysis started.  Has had some issues with acces in LUE with revision and stenting of venous inflow needed.  VVS sees Dr Imogene Burn.  Reviewed CT chest done 11/11 and no coronary calcium Also has myesthenia followed by Dr [name].  Had lid lag and had a tough time with Rx.  No history of CAD.  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  No dyspnea, LE edema palpitations NO history of CHF or valvular disease  ROS: Denies fever, malais, weight loss, blurry vision, decreased visual acuity, cough, sputum, SOB, hemoptysis, pleuritic pain, palpitaitons, heartburn, abdominal pain, melena, lower extremity edema, claudication, or rash.  All other systems reviewed and negative   General: Affect appropriate Healthy:  appears stated age HEENT: normal Neck supple with no adenopathy JVP normal no bruits no thyromegaly Lungs clear with no wheezing and good diaphragmatic motion Heart:  S1/S2 no murmur,rub, gallop or click PMI normal Abdomen: benighn, BS positve, no tenderness, no AAA  S/P exploratory lap and GB surgery no bruit.  No HSM or HJR Distal pulses intact with thrill in LUE fistula that has been revised numerous times No edema Neuro non-focal Skin warm and dry No muscular weakness  Medications Current Outpatient Prescriptions  Medication Sig Dispense Refill  . aspirin 81 MG tablet Take 162 mg by mouth daily.       . B Complex-C-Folic Acid (DIALYVITE 800 PO) Take 1 tablet by mouth daily.      . calcium acetate (PHOSLO) 667 MG capsule Take 1,334-2,668 mg by mouth See admin instructions. Takes 2 capsules with snacks and 4 capsules with meals      . clonazePAM (KLONOPIN) 0.5 MG tablet Take 1 tablet (0.5 mg total) by mouth 2 (two) times daily as needed for anxiety.  10 tablet  0  . Epoetin Alfa (EPOGEN IJ)  Inject as directed once a week.      . gabapentin (NEURONTIN) 300 MG capsule Take 300 mg by mouth at bedtime as needed. For feet/nerve pain      . paricalcitol (ZEMPLAR) 2 MCG/ML injection Inject 2 mcg into the vein 3 (three) times a week.      . traMADol (ULTRAM) 50 MG tablet Take 1 tablet (50 mg total) by mouth every 8 (eight) hours as needed for pain.  15 tablet  0    Allergies Avelox; Gluten; Ciprofloxacin; Erythromycin; Gemifloxacin; Gentamicin; Heparin; Kanamycin; Levofloxacin; Neomycin; Norfloxacin; Streptomycin; and Tobramycin  Family History: No family history on file.  Social History: History   Social History  . Marital Status: Married    Spouse Name: N/A    Number of Children: N/A  . Years of Education: N/A   Occupational History  . Not on file.   Social History Main Topics  . Smoking status: Former Smoker    Types: Cigarettes    Quit date: 05/30/1969  . Smokeless tobacco: Not on file  . Alcohol Use: No  . Drug Use: No  . Sexually Active:    Other Topics Concern  . Not on file   Social History Narrative  . No narrative on file    Electrocardiogram:  SR rate 59 normal  Assessment and Plan

## 2012-05-25 NOTE — CV Procedure (Signed)
  Catheterization   Indication: Abnormal myovue pre renal transplant evaluation  Procedure: After informed consent and clinical "time out" the right groin was prepped and draped in a sterile fashion.  A 5Fr sheath was placed in the right femoral artery using seldinger technique and local lidocaine.  Standard JL4, JR4 and angled pigtail catheters were used to engage the coronary arteries.  Coronary arteries were visualized in orthogonal views using caudal and cranial angulation.    Medications:   Versed: 2 mg's  Fentanyl: 0 ug's  Coronary Arteries: Right dominant with no anomalies  LM: Normal   LAD: normal    IM: normal  D1: normal  D2 normal and small  Circumflex: normal   OM1: normal  OM2: normal and small  RCA: Dominant and normal   PDA: normal  PLA: normal   Hemodynamics:  Aortic Pressure: 105 51  mmHg  LV Pressure: 124 15  mmHg  Impression:  No significant CAD  Should be ok for transplant evaluation.  Only 25cc of dye used.  FU Dr Briant Cedar for Cr and renal.  Continue  Home dialysis  Call Dr Briant Cedar if he becomes anuric  Charlton Haws 05/25/2012 9:49 AM

## 2012-05-25 NOTE — Interval H&P Note (Signed)
History and Physical Interval Note:  05/25/2012 9:48 AM  Adam Walsh  has presented today for surgery, with the diagnosis of cp  The various methods of treatment have been discussed with the patient and family. After consideration of risks, benefits and other options for treatment, the patient has consented to  Procedure(s) (LRB) with comments: JV LEFT HEART CATHETERIZATION WITH CORONARY ANGIOGRAM (N/A) as a surgical intervention .  The patient's history has been reviewed, patient examined, no change in status, stable for surgery.  I have reviewed the patient's chart and labs.  Questions were answered to the patient's satisfaction.     Charlton Haws

## 2012-05-25 NOTE — Progress Notes (Signed)
Bedrest begins @ 1030, tegaderm dressing applied by Leitha Schuller, right groin site level 0.

## 2012-05-25 NOTE — Progress Notes (Signed)
Allen's test performed on right hand with negative results.

## 2012-09-24 ENCOUNTER — Emergency Department (HOSPITAL_COMMUNITY)
Admission: EM | Admit: 2012-09-24 | Discharge: 2012-09-24 | Disposition: A | Discharge status: Home / Self Care | Payer: Medicare Other | Attending: Emergency Medicine | Admitting: Emergency Medicine

## 2012-09-24 ENCOUNTER — Emergency Department (HOSPITAL_COMMUNITY): Payer: Medicare Other

## 2012-09-24 ENCOUNTER — Encounter (HOSPITAL_COMMUNITY): Payer: Self-pay | Admitting: *Deleted

## 2012-09-24 DIAGNOSIS — R079 Chest pain, unspecified: Secondary | ICD-10-CM | POA: Insufficient documentation

## 2012-09-24 DIAGNOSIS — R05 Cough: Secondary | ICD-10-CM

## 2012-09-24 DIAGNOSIS — R091 Pleurisy: Secondary | ICD-10-CM | POA: Insufficient documentation

## 2012-09-24 DIAGNOSIS — Z8669 Personal history of other diseases of the nervous system and sense organs: Secondary | ICD-10-CM | POA: Insufficient documentation

## 2012-09-24 DIAGNOSIS — R109 Unspecified abdominal pain: Secondary | ICD-10-CM | POA: Insufficient documentation

## 2012-09-24 DIAGNOSIS — Z87442 Personal history of urinary calculi: Secondary | ICD-10-CM | POA: Insufficient documentation

## 2012-09-24 DIAGNOSIS — R059 Cough, unspecified: Secondary | ICD-10-CM | POA: Insufficient documentation

## 2012-09-24 DIAGNOSIS — Z862 Personal history of diseases of the blood and blood-forming organs and certain disorders involving the immune mechanism: Secondary | ICD-10-CM | POA: Insufficient documentation

## 2012-09-24 DIAGNOSIS — Z992 Dependence on renal dialysis: Secondary | ICD-10-CM | POA: Insufficient documentation

## 2012-09-24 DIAGNOSIS — Z87891 Personal history of nicotine dependence: Secondary | ICD-10-CM | POA: Insufficient documentation

## 2012-09-24 DIAGNOSIS — Z7982 Long term (current) use of aspirin: Secondary | ICD-10-CM | POA: Insufficient documentation

## 2012-09-24 DIAGNOSIS — R0781 Pleurodynia: Secondary | ICD-10-CM

## 2012-09-24 DIAGNOSIS — R0602 Shortness of breath: Secondary | ICD-10-CM | POA: Insufficient documentation

## 2012-09-24 LAB — COMPREHENSIVE METABOLIC PANEL
ALT: 14 U/L (ref 0–53)
AST: 16 U/L (ref 0–37)
Albumin: 3.8 g/dL (ref 3.5–5.2)
Alkaline Phosphatase: 75 U/L (ref 39–117)
Chloride: 102 mEq/L (ref 96–112)
Potassium: 4.7 mEq/L (ref 3.5–5.1)
Sodium: 139 mEq/L (ref 135–145)
Total Bilirubin: 0.6 mg/dL (ref 0.3–1.2)
Total Protein: 7.2 g/dL (ref 6.0–8.3)

## 2012-09-24 LAB — URINE MICROSCOPIC-ADD ON

## 2012-09-24 LAB — URINALYSIS, ROUTINE W REFLEX MICROSCOPIC
Glucose, UA: 250 mg/dL — AB
Ketones, ur: NEGATIVE mg/dL
Leukocytes, UA: NEGATIVE
Protein, ur: 100 mg/dL — AB
pH: 8.5 — ABNORMAL HIGH (ref 5.0–8.0)

## 2012-09-24 LAB — CBC WITH DIFFERENTIAL/PLATELET
Basophils Absolute: 0 10*3/uL (ref 0.0–0.1)
Basophils Relative: 0 % (ref 0–1)
Eosinophils Absolute: 0.3 10*3/uL (ref 0.0–0.7)
Hemoglobin: 12 g/dL — ABNORMAL LOW (ref 13.0–17.0)
MCH: 32.7 pg (ref 26.0–34.0)
MCHC: 33.9 g/dL (ref 30.0–36.0)
Monocytes Relative: 7 % (ref 3–12)
Neutro Abs: 8 10*3/uL — ABNORMAL HIGH (ref 1.7–7.7)
Neutrophils Relative %: 78 % — ABNORMAL HIGH (ref 43–77)
Platelets: 156 10*3/uL (ref 150–400)

## 2012-09-24 MED ORDER — HYDROMORPHONE HCL PF 1 MG/ML IJ SOLN
1.0000 mg | Freq: Once | INTRAMUSCULAR | Status: AC
  Administered 2012-09-24: 1 mg via INTRAVENOUS
  Filled 2012-09-24: qty 1

## 2012-09-24 MED ORDER — CEFDINIR 300 MG PO CAPS: ORAL_CAPSULE | ORAL | Status: DC

## 2012-09-24 MED ORDER — AZITHROMYCIN 250 MG PO TABS
500.0000 mg | ORAL_TABLET | Freq: Once | ORAL | Status: AC
  Administered 2012-09-24: 500 mg via ORAL
  Filled 2012-09-24: qty 2

## 2012-09-24 MED ORDER — HYDROCODONE-ACETAMINOPHEN 5-325 MG PO TABS: 1.0000 | ORAL_TABLET | Freq: Four times a day (QID) | ORAL | Status: DC | PRN

## 2012-09-24 MED ORDER — IOHEXOL 350 MG/ML SOLN
100.0000 mL | Freq: Once | INTRAVENOUS | Status: AC | PRN
  Administered 2012-09-24: 100 mL via INTRAVENOUS

## 2012-09-24 MED ORDER — ONDANSETRON HCL 4 MG/2ML IJ SOLN
4.0000 mg | Freq: Once | INTRAMUSCULAR | Status: AC
  Administered 2012-09-24: 4 mg via INTRAVENOUS
  Filled 2012-09-24: qty 2

## 2012-09-24 MED ORDER — DEXTROSE 5 % IV SOLN
1.0000 g | INTRAVENOUS | Status: DC
  Administered 2012-09-24: 1 g via INTRAVENOUS
  Filled 2012-09-24: qty 10

## 2012-09-24 NOTE — ED Notes (Signed)
Pt returned from CT °

## 2012-09-24 NOTE — ED Notes (Signed)
Pt is here with right upper flank, abdominal, and shoulder pain.  Pt feels like he cannot get a deep breath.   Pt denies chest pain

## 2012-09-24 NOTE — ED Notes (Signed)
Family at bedside.offer pt's wife something drink a cup of coffee.

## 2012-09-24 NOTE — ED Provider Notes (Addendum)
History     CSN: [id number]  Arrival date & time 09/24/12  1191   First MD Initiated Contact with Patient 09/24/12 [phone]      Chief Complaint  Patient presents with  . Shoulder Pain  . Abdominal Pain    (Consider location/radiation/quality/duration/timing/severity/associated sxs/prior treatment) Patient is a 72 y.o. male presenting with shoulder pain and abdominal pain. The history is provided by the patient.  Shoulder Pain Associated symptoms include abdominal pain and shortness of breath. Pertinent negatives include no chest pain and no headaches.  Abdominal Pain Associated symptoms: cough and shortness of breath   Associated symptoms: no chest pain, no chills, no constipation, no diarrhea, no dysuria, no fever, no hematuria and no vomiting   pt w hx esrd, hd, had normal hd yesterday, c/o right upper quadrant, right upper flank pain laterally since last evening. Constant. Dull. Not radiating. Mildly pleuritic. +recent non prod cough. No sore throat or other uri c/o. No fever or chills. Denies leg pain or swelling. No hx dvt or pe.  No recent surgery, prolonged immobility or recent travel. Hx gallstones, remote hx cholecystectomy. Had hx prior kidney stone, states pain was different. Despite hx esrd, state has continued to make his normal amt urine-  No recent change. No hematuria or dysuria. No back pain. No skin changes or rash in area of pain. No recent musculoskeletal injury or strain. Denies  diaphoresis. States pain/pleuritic nature of pain make him feel mildly sob.     Past Medical History  Diagnosis Date  . Renal insufficiency   . Hemodialysis patient     Monday, Wednesday and Friday  . HIT (heparin-induced thrombocytopenia)   . Myasthenia gravis   . Hypertension   . History of pyelonephritis   . History of nephrolithiasis   . Celiac sprue     Past Surgical History  Procedure Laterality Date  . Exploratory laparotomy    . Transurethral resection of prostate    .  Cholecystectomy    . Av fistula placement, radiocephalic  08/03/10    Left  . Basilic vein transposition  10/06/10    2nd stage Left arm  . Hematoma evacuation  11/23/10    Left Arm     No family history on file.  History  Substance Use Topics  . Smoking status: Former Smoker    Types: Cigarettes    Quit date: 05/30/1969  . Smokeless tobacco: Not on file  . Alcohol Use: No      Review of Systems  Constitutional: Negative for fever and chills.  HENT: Negative for neck pain.   Eyes: Negative for redness.  Respiratory: Positive for cough and shortness of breath.   Cardiovascular: Negative for chest pain and leg swelling.  Gastrointestinal: Positive for abdominal pain. Negative for vomiting, diarrhea and constipation.  Genitourinary: Positive for flank pain. Negative for dysuria and hematuria.  Musculoskeletal: Negative for back pain.  Skin: Negative for rash.  Neurological: Negative for headaches.  Hematological: Does not bruise/bleed easily.  Psychiatric/Behavioral: Negative for confusion.    Allergies  Avelox; Gluten; Ciprofloxacin; Erythromycin; Gemifloxacin; Gentamicin; Heparin; Kanamycin; Levofloxacin; Neomycin; Norfloxacin; Streptomycin; and Tobramycin  Home Medications   Current Outpatient Rx  Name  Route  Sig  Dispense  Refill  . aspirin 81 MG tablet   Oral   Take 162 mg by mouth daily.          . B Complex-C-Folic Acid (DIALYVITE 800 PO)   Oral   Take 1 tablet by mouth daily.         Marland Kitchen  calcium acetate (PHOSLO) 667 MG capsule   Oral   Take 1,334-2,668 mg by mouth See admin instructions. Takes 2 capsules with snacks and 4 capsules with meals         . Epoetin Alfa (EPOGEN IJ)   Injection   Inject as directed once a week.         . gabapentin (NEURONTIN) 300 MG capsule   Oral   Take 300 mg by mouth at bedtime as needed. For feet/nerve pain         . omeprazole (PRILOSEC) 20 MG capsule   Oral   Take 20 mg by mouth daily.         .  paricalcitol (ZEMPLAR) 2 MCG/ML injection   Intravenous   Inject 2 mcg into the vein 3 (three) times a week.           BP 144/72  Pulse 77  Temp(Src) 98.4 F (36.9 C) (Oral)  Resp 21  SpO2 97%  Physical Exam  Nursing note and vitals reviewed. Constitutional: He is oriented to person, place, and time. He appears well-developed and well-nourished. No distress.  HENT:  Nose: Nose normal.  Mouth/Throat: Oropharynx is clear and moist.  Eyes: Conjunctivae are normal.  Neck: Neck supple. No tracheal deviation present.  Cardiovascular: Normal rate, regular rhythm, normal heart sounds and intact distal pulses.   Pulmonary/Chest: Effort normal and breath sounds normal. No accessory muscle usage. No respiratory distress.  Abdominal: Soft. Bowel sounds are normal. He exhibits no distension and no mass. There is no tenderness. There is no rebound and no guarding.  Genitourinary:  No cva tenderness  Musculoskeletal: Normal range of motion. He exhibits no edema and no tenderness.  Neurological: He is alert and oriented to person, place, and time.  Skin: Skin is warm and dry. No rash noted.  No shingles/rash in area of pain  Psychiatric: He has a normal mood and affect.    ED Course  Procedures (including critical care time)  Results for orders placed during the hospital encounter of 09/24/12  CBC WITH DIFFERENTIAL      Result Value Range   WBC 10.2  4.0 - 10.5 K/uL   RBC 3.67 (*) 4.22 - 5.81 MIL/uL   Hemoglobin 12.0 (*) 13.0 - 17.0 g/dL   HCT 40.9 (*) 81.1 - 91.4 %   MCV 96.5  78.0 - 100.0 fL   MCH 32.7  26.0 - 34.0 pg   MCHC 33.9  30.0 - 36.0 g/dL   RDW 78.2  95.6 - 21.3 %   Platelets 156  150 - 400 K/uL   Neutrophils Relative 78 (*) 43 - 77 %   Neutro Abs 8.0 (*) 1.7 - 7.7 K/uL   Lymphocytes Relative 12  12 - 46 %   Lymphs Abs 1.2  0.7 - 4.0 K/uL   Monocytes Relative 7  3 - 12 %   Monocytes Absolute 0.7  0.1 - 1.0 K/uL   Eosinophils Relative 3  0 - 5 %   Eosinophils  Absolute 0.3  0.0 - 0.7 K/uL   Basophils Relative 0  0 - 1 %   Basophils Absolute 0.0  0.0 - 0.1 K/uL  COMPREHENSIVE METABOLIC PANEL      Result Value Range   Sodium 139  135 - 145 mEq/L   Potassium 4.7  3.5 - 5.1 mEq/L   Chloride 102  96 - 112 mEq/L   CO2 28  19 - 32 mEq/L   Glucose, Bld 136 (*)  70 - 99 mg/dL   BUN 39 (*) 6 - 23 mg/dL   Creatinine, Ser 1.61 (*) 0.50 - 1.35 mg/dL   Calcium 9.9  8.4 - 09.6 mg/dL   Total Protein 7.2  6.0 - 8.3 g/dL   Albumin 3.8  3.5 - 5.2 g/dL   AST 16  0 - 37 U/L   ALT 14  0 - 53 U/L   Alkaline Phosphatase 75  39 - 117 U/L   Total Bilirubin 0.6  0.3 - 1.2 mg/dL   GFR calc non Af Amer 6 (*) >90 mL/min   GFR calc Af Amer 7 (*) >90 mL/min  URINALYSIS, ROUTINE W REFLEX MICROSCOPIC      Result Value Range   Color, Urine YELLOW  YELLOW   APPearance CLEAR  CLEAR   Specific Gravity, Urine 1.011  1.005 - 1.030   pH 8.5 (*) 5.0 - 8.0   Glucose, UA 250 (*) NEGATIVE mg/dL   Hgb urine dipstick SMALL (*) NEGATIVE   Bilirubin Urine NEGATIVE  NEGATIVE   Ketones, ur NEGATIVE  NEGATIVE mg/dL   Protein, ur 045 (*) NEGATIVE mg/dL   Urobilinogen, UA 0.2  0.0 - 1.0 mg/dL   Nitrite NEGATIVE  NEGATIVE   Leukocytes, UA NEGATIVE  NEGATIVE  LIPASE, BLOOD      Result Value Range   Lipase 11  11 - 59 U/L  D-DIMER, QUANTITATIVE      Result Value Range   D-Dimer, Quant 1.71 (*) 0.00 - 0.48 ug/mL-FEU  URINE MICROSCOPIC-ADD ON      Result Value Range   Squamous Epithelial / LPF RARE  RARE   WBC, UA 0-2  <3 WBC/hpf   RBC / HPF 0-2  <3 RBC/hpf   Bacteria, UA RARE  RARE   Dg Chest 2 View  09/24/2012  *RADIOLOGY REPORT*  Clinical Data: Chest pain.  Shoulder pain.  CHEST - 2 VIEW  Comparison: 04/29/2012  Findings: Two views of the chest were obtained.  There is increased blunting at the right costophrenic angle and slightly increased densities along the anterior lower chest on the lateral view. Again noted are surgical changes in the upper abdomen.  Again noted is a  retained cuff from a dialysis catheter in the right upper chest soft tissues.  Heart size is stable.  Chronic densities at left costophrenic angle are suggestive for scarring.  IMPRESSION:  Slightly increased densities at the right costophrenic angle could represent a combination of scarring and small pleural effusion.   Original Report Authenticated By: Richarda Overlie, M.D.    Ct Angio Chest Pe W/cm &/or Wo Cm  09/24/2012  *RADIOLOGY REPORT*  Clinical Data: Right upper flank and shoulder pain.  CT ANGIOGRAPHY CHEST  Technique:  Multidetector CT imaging of the chest using the standard protocol during bolus administration of intravenous contrast. Multiplanar reconstructed images including MIPs were obtained and reviewed to evaluate the vascular anatomy.  Contrast: OMNIPAQUE IOHEXOL 350 MG/ML SOLN  Comparison: 04/06/2010  Findings: Study is negative for a pulmonary embolism.  Postsurgical changes at the GE junction.  Otherwise, images of the upper abdomen are unremarkable.  No significant chest lymphadenopathy or pericardial fluid.  There is a small amount of right pleural fluid and pleural thickening at the right lung base.  Trachea and mainstem bronchi are patent.  There is volume loss in the right lower lobe compared to the previous examination and there is some chronic consolidation or volume loss along the base of the right middle lobe.  There is a stable 4 mm nodule in the right middle lobe on image 43.  Stable scarring at the base of the lingula.  There is a small radiopaque structure in the right chest anterior subcutaneous tissues compatible with the cuff from a previous dialysis catheter.  No acute bony abnormality.  IMPRESSION: Negative for pulmonary embolism.  There is volume loss in the right lower lobe and right middle lobe along with a small right pleural effusion.  Stable 4 mm nodule in the right middle lobe.   Original Report Authenticated By: Richarda Overlie, M.D.        MDM  Iv ns. Labs.  Dilaudid iv. zofran iv. Cxr. Labs.  Reviewed nursing notes and prior charts for additional history.  Reviewed prior notes, pt w cardiac cath 12/13 w normal coronaries.    Date: 09/24/2012  Rate: 76  Rhythm: normal sinus rhythm  QRS Axis: normal  Intervals: normal  ST/T Wave abnormalities: normal  Conduction Disutrbances:none  Narrative Interpretation:   Old EKG Reviewed: unchanged  Pleuritic pain, sob, elevated ddimer - ct.  Ct neg for pe.   Given cough, ?right basilar consolidation, sm eff, ?early pna w pleurisy/pleuritic pain. Rocephin iv. zithromax po.  Clarified multiple abx 'allergies' w pt and spouse. Pt developed MG after tx w avelox, therefore listed these medications. No other specific abx allergy.   Recheck abd soft nt.  No increased wob.  Pain improved.         Suzi Roots, MD 09/24/12 1331  Suzi Roots, MD 09/24/12 [phone]

## 2012-09-24 NOTE — ED Notes (Signed)
Pt has dialysis at home four days a week. Right fistula upper left arm.

## 2012-09-24 NOTE — ED Notes (Signed)
Family at bedside. 

## 2013-01-21 ENCOUNTER — Telehealth: Payer: Self-pay | Admitting: Neurology

## 2013-01-22 MED ORDER — PYRIDOSTIGMINE BROMIDE 60 MG PO TABS: ORAL_TABLET | ORAL | Status: DC

## 2013-01-22 NOTE — Telephone Encounter (Signed)
I called patient. He has not been seen here since 2012. The patient was doing well at that time, without any symptoms, and he was off of all his medications. The patient has begun having jaw weakness within the last 2 weeks. I will call in Mestinon, and get a revisit for him.

## 2013-01-23 NOTE — Telephone Encounter (Signed)
Please schedule patient, per Dr. [name].

## 2013-01-24 ENCOUNTER — Ambulatory Visit (INDEPENDENT_AMBULATORY_CARE_PROVIDER_SITE_OTHER): Payer: Medicare Other | Admitting: Neurology

## 2013-01-24 ENCOUNTER — Encounter: Payer: Self-pay | Admitting: Neurology

## 2013-01-24 VITALS — BP 135/69 | HR 62 | Ht 70.0 in | Wt 194.8 lb

## 2013-01-24 DIAGNOSIS — G7000 Myasthenia gravis without (acute) exacerbation: Secondary | ICD-10-CM | POA: Insufficient documentation

## 2013-01-24 DIAGNOSIS — G7001 Myasthenia gravis with (acute) exacerbation: Secondary | ICD-10-CM

## 2013-01-24 NOTE — Progress Notes (Signed)
Reason for visit: Myasthenia gravis  Adam Walsh is an 72 y.o. male  History of present illness:  Mr. Bosket is a 72 year old right-handed white male with a history of myasthenia gravis. The patient has end-stage renal disease, and he currently is getting hemodialysis in the home environment. The patient is being considered for a renal transplant. The patient has done very well with his myasthenia gravis, and he was last seen through this office in 2012. The patient had no symptoms from his myasthenia, and all of his medications were discontinued. The patient has done well for the last 2 years, but within the last 3 or 4 weeks, the patient has noted some problems with chewing, with jaw closure weakness. The patient has generalized fatigue, and he recently was given iron infusions for anemia. The patient was placed on Mestinon, taking one half of a 60 mg tablet 4 times daily. The patient has developed diarrhea on this dose. The patient has gained improvement with his jaw strength on Mestinon. The patient denies any problems with dysphagia, or reflux of food or fluids through the nose with swallowing. The patient has not had any change in speech, or weakness of the extremities. The patient denies any ptosis or double vision. The patient returns for an evaluation.  Past Medical History  Diagnosis Date  . Renal insufficiency   . Hemodialysis patient     Monday, Wednesday and Friday  . HIT (heparin-induced thrombocytopenia)   . Myasthenia gravis   . Hypertension   . History of pyelonephritis   . History of nephrolithiasis   . Celiac sprue   . Hearing deficit     left hearing aid  . Neuropathy     Peripheral neuropathy  . Pancreatic cyst     Past Surgical History  Procedure Laterality Date  . Exploratory laparotomy    . Transurethral resection of prostate    . Cholecystectomy    . Av fistula placement, radiocephalic  08/03/10    Left  . Basilic vein transposition  10/06/10    2nd  stage Left arm  . Hematoma evacuation  11/23/10    Left Arm     Family History  Problem Relation Age of Onset  . Stroke Mother   . Diabetes Mother   . Heart disease Brother     Social history:  reports that he quit smoking about 43 years ago. His smoking use included Cigarettes. He smoked 0.00 packs per day. He does not have any smokeless tobacco history on file. He reports that he does not drink alcohol or use illicit drugs.    Allergies  Allergen Reactions  . Avelox [Moxifloxacin Hcl In Nacl]     Pt quit breathing  . Gluten   . Ciprofloxacin Other (See Comments)    myasthenia  . Erythromycin Other (See Comments)    myasthenia  . Gemifloxacin Other (See Comments)    myasthenia  . Gentamicin Other (See Comments)    myasthenia  . Heparin Other (See Comments)    myasthenia  . Kanamycin Other (See Comments)    myasthenia  . Levofloxacin Other (See Comments)    myasthenia  . Neomycin Other (See Comments)    myasthenia  . Norfloxacin Other (See Comments)    myasthenia  . Streptomycin Other (See Comments)    myasthenia  . Tobramycin Other (See Comments)    myasthenia    Medications:  Current Outpatient Prescriptions on File Prior to Visit  Medication Sig Dispense Refill  .  aspirin 81 MG tablet Take 162 mg by mouth daily.       . B Complex-C-Folic Acid (DIALYVITE 800 PO) Take 1 tablet by mouth daily.      . calcium acetate (PHOSLO) 667 MG capsule Take 1,334-2,668 mg by mouth See admin instructions. Takes 2 capsules with snacks and 4 capsules with meals      . Epoetin Alfa (EPOGEN IJ) Inject as directed once a week. 10ml      . gabapentin (NEURONTIN) 300 MG capsule Take 300 mg by mouth at bedtime as needed. For feet/nerve pain      . HYDROcodone-acetaminophen (NORCO/VICODIN) 5-325 MG per tablet Take 1-2 tablets by mouth every 6 (six) hours as needed for pain.  20 tablet  0  . omeprazole (PRILOSEC) 20 MG capsule Take 20 mg by mouth daily.      . paricalcitol (ZEMPLAR) 2  MCG/ML injection Inject 2 mcg into the vein 3 (three) times a week.      . pyridostigmine (MESTINON) 60 MG tablet One half tablet 4 times daily  60 tablet  1   No current facility-administered medications on file prior to visit.    ROS:  Out of a complete 14 system review of symptoms, the patient complains only of the following symptoms, and all other reviewed systems are negative.  Fatigue Hearing deficit Blurred vision, eye pain Diarrhea Easy bruising, easy bleeding Feeling cold Joint pain, muscle cramps, achy muscles Runny nose Headache, weakness, dizziness with hemodialysis Decreased energy Insomnia, restless legs  Blood pressure 135/69, pulse 62, height 5\' 10"  (1.778 m), weight 194 lb 12 oz (88.338 kg).  Physical Exam  General: The patient is alert and cooperative at the time of the examination.  Skin: No significant peripheral edema is noted.   Neurologic Exam  Cranial nerves: Facial symmetry is present. Speech is normal, no aphasia or dysarthria is noted. Extraocular movements are full. Visual fields are full. The patient notes some slight double vision at 15 seconds with superior gaze. No divergence of gaze is seen and no ptosis is noted after 1 minute.  Motor: The patient has good strength in all 4 extremities. The patient has no evidence of fatigable weakness in the deltoid muscles with the arms outstretched 1 minute.  Coordination: The patient has good finger-nose-finger and heel-to-shin bilaterally.  Gait and station: The patient has a normal gait. Tandem gait is normal. Romberg is negative. No drift is seen.  Reflexes: Deep tendon reflexes are symmetric.   Assessment/Plan:  1. Myasthenia gravis  The patient is having symptoms of jaw closure weakness. The patient is not having any dysphagia with this. The patient is having some difficulty with diarrhea on the mestinon, but he also has bowel issues that include celiac disease. The patient will cut the  Mestinon tablet in quarters, and take one fourth of a tablet 3 times daily before meals. The patient will contact me if he is not doing well. The patient will followup in 4-6 months.  Marlan Palau MD 01/24/2013 2:33 PM  [facility] [address] [place], [place] [address]  Phone [phone] Fax [phone]

## 2013-01-29 ENCOUNTER — Telehealth: Payer: Self-pay | Admitting: Neurology

## 2013-01-29 MED ORDER — PREDNISONE 5 MG PO TABS: ORAL_TABLET | ORAL | Status: DC

## 2013-01-29 NOTE — Telephone Encounter (Signed)
Patient having double vision, dizziness, N/V/D. Can see close but far distance can't see and feels like he's drunk. Medication, if takes enough, it will clear vision but, causes stomach problems, diarrhea and nausea. Please call.

## 2013-01-29 NOTE — Telephone Encounter (Signed)
I called patient. The patient is having a lot of bowel symptoms with abdominal cramps and diarrhea. The patient has celiac disease, and his myasthenia gravis has exacerbated. The patient indicates that after a short course of Mestinon, his diarrhea got worse, but his ability to chew has improved. Coming off of Mestinon, however, he has had double vision. The patient is unable to tolerate even one quarter tablet of the Mestinon at that time. The patient will stop the Mestinon, and I will place him on low-dose prednisone.

## 2013-02-01 ENCOUNTER — Telehealth: Payer: Self-pay | Admitting: *Deleted

## 2013-02-01 NOTE — Telephone Encounter (Signed)
The patients wife is calling with a complaint that the new med doctor [name] has her husband on is having a serious reaction. Progesterone. The patient has Myasthenia Gravis and the medication is giving the patient bad diarrhea and his eyes have double vision.  He goes to the bathroom very little Urine comes out but feels like his has to go quite often. Please call and advise if patient should continue the medication

## 2013-02-01 NOTE — Telephone Encounter (Signed)
The patient got benefit from the Mestinon, but he could not tolerate with diarrhea. The patient was placed on prednisone, but they miss-read the instructions, and they were taking only 5 mg once a week. They were supposed to start on 5 mg daily for one week, then go to 10 mg daily. They indicate that the chewing and the double vision has worsened. The patient will go to 10 mg daily at this time, after one week, they'll go to 15 mg. We may have to accelerate the prednisone dosing if he continues to worsen.

## 2013-02-01 NOTE — Telephone Encounter (Signed)
The patients wife is calling with a complaint that the new med doctor [name] has her husband on is having a serious reaction. Progesterone. The patient has Myasthenia Gravis and the medication is giving the patient bad diarrhea and his eyes have double vision and his bladder must have an infection he goes to the bathroom very little Urine but feels like his has to go quite often. Please call and advise if patient should continue the medication.

## 2013-02-02 ENCOUNTER — Encounter (HOSPITAL_COMMUNITY): Payer: Self-pay | Admitting: *Deleted

## 2013-02-02 ENCOUNTER — Emergency Department (HOSPITAL_COMMUNITY): Payer: Medicare Other

## 2013-02-02 ENCOUNTER — Inpatient Hospital Stay (HOSPITAL_COMMUNITY)
Admission: EM | Admit: 2013-02-02 | Discharge: 2013-02-07 | DRG: 056 | Disposition: A | Discharge status: Home / Self Care | Payer: Medicare Other | Attending: Internal Medicine | Admitting: Internal Medicine

## 2013-02-02 DIAGNOSIS — H02409 Unspecified ptosis of unspecified eyelid: Secondary | ICD-10-CM | POA: Diagnosis present

## 2013-02-02 DIAGNOSIS — F411 Generalized anxiety disorder: Secondary | ICD-10-CM | POA: Diagnosis not present

## 2013-02-02 DIAGNOSIS — D7582 Heparin induced thrombocytopenia (HIT): Secondary | ICD-10-CM

## 2013-02-02 DIAGNOSIS — D649 Anemia, unspecified: Secondary | ICD-10-CM | POA: Diagnosis not present

## 2013-02-02 DIAGNOSIS — G609 Hereditary and idiopathic neuropathy, unspecified: Secondary | ICD-10-CM | POA: Diagnosis present

## 2013-02-02 DIAGNOSIS — G7000 Myasthenia gravis without (acute) exacerbation: Secondary | ICD-10-CM | POA: Diagnosis present

## 2013-02-02 DIAGNOSIS — Z992 Dependence on renal dialysis: Secondary | ICD-10-CM | POA: Diagnosis present

## 2013-02-02 DIAGNOSIS — N2581 Secondary hyperparathyroidism of renal origin: Secondary | ICD-10-CM | POA: Diagnosis present

## 2013-02-02 DIAGNOSIS — G7001 Myasthenia gravis with (acute) exacerbation: Principal | ICD-10-CM

## 2013-02-02 DIAGNOSIS — Z7982 Long term (current) use of aspirin: Secondary | ICD-10-CM

## 2013-02-02 DIAGNOSIS — K900 Celiac disease: Secondary | ICD-10-CM | POA: Diagnosis present

## 2013-02-02 DIAGNOSIS — H9190 Unspecified hearing loss, unspecified ear: Secondary | ICD-10-CM | POA: Diagnosis present

## 2013-02-02 DIAGNOSIS — I120 Hypertensive chronic kidney disease with stage 5 chronic kidney disease or end stage renal disease: Secondary | ICD-10-CM | POA: Diagnosis present

## 2013-02-02 DIAGNOSIS — M899 Disorder of bone, unspecified: Secondary | ICD-10-CM | POA: Diagnosis present

## 2013-02-02 DIAGNOSIS — D75829 Heparin-induced thrombocytopenia, unspecified: Secondary | ICD-10-CM | POA: Diagnosis present

## 2013-02-02 DIAGNOSIS — Z87891 Personal history of nicotine dependence: Secondary | ICD-10-CM

## 2013-02-02 DIAGNOSIS — I10 Essential (primary) hypertension: Secondary | ICD-10-CM | POA: Diagnosis present

## 2013-02-02 DIAGNOSIS — Z79899 Other long term (current) drug therapy: Secondary | ICD-10-CM

## 2013-02-02 DIAGNOSIS — N186 End stage renal disease: Secondary | ICD-10-CM | POA: Diagnosis present

## 2013-02-02 LAB — CBC WITH DIFFERENTIAL/PLATELET
Eosinophils Relative: 1 % (ref 0–5)
HCT: 31.2 % — ABNORMAL LOW (ref 39.0–52.0)
Lymphocytes Relative: 18 % (ref 12–46)
Lymphs Abs: 1.4 10*3/uL (ref 0.7–4.0)
MCV: 97.5 fL (ref 78.0–100.0)
Monocytes Absolute: 0.5 10*3/uL (ref 0.1–1.0)
RBC: 3.2 MIL/uL — ABNORMAL LOW (ref 4.22–5.81)
RDW: 12.8 % (ref 11.5–15.5)
WBC: 8.1 10*3/uL (ref 4.0–10.5)

## 2013-02-02 LAB — COMPREHENSIVE METABOLIC PANEL
BUN: 29 mg/dL — ABNORMAL HIGH (ref 6–23)
CO2: 26 mEq/L (ref 19–32)
Calcium: 9.2 mg/dL (ref 8.4–10.5)
Creatinine, Ser: 6.89 mg/dL — ABNORMAL HIGH (ref 0.50–1.35)
GFR calc Af Amer: 8 mL/min — ABNORMAL LOW (ref >90)
GFR calc non Af Amer: 7 mL/min — ABNORMAL LOW (ref >90)
Glucose, Bld: 109 mg/dL — ABNORMAL HIGH (ref 70–99)

## 2013-02-02 LAB — POCT I-STAT TROPONIN I

## 2013-02-02 MED ORDER — SODIUM CHLORIDE 0.9 % IJ SOLN
3.0000 mL | Freq: Two times a day (BID) | INTRAMUSCULAR | Status: DC
  Administered 2013-02-02 – 2013-02-07 (×8): 3 mL via INTRAVENOUS

## 2013-02-02 MED ORDER — SODIUM CHLORIDE 0.9 % IV SOLN: 250.0000 mL | INTRAVENOUS | Status: DC | PRN

## 2013-02-02 MED ORDER — SODIUM CHLORIDE 0.9 % IJ SOLN: 3.0000 mL | Freq: Two times a day (BID) | INTRAMUSCULAR | Status: DC

## 2013-02-02 MED ORDER — IMMUNE GLOBULIN (HUMAN) 10 GM/100ML IV SOLN
30.0000 g | Freq: Every day | INTRAVENOUS | Status: AC
  Administered 2013-02-03 – 2013-02-05 (×4): 30 g via INTRAVENOUS
  Filled 2013-02-02 (×8): qty 300

## 2013-02-02 MED ORDER — IMMUNE GLOBULIN (HUMAN) 10 GM/100ML IV SOLN
400.0000 mg/kg | Freq: Every day | INTRAVENOUS | Status: DC
  Filled 2013-02-02: qty 350

## 2013-02-02 MED ORDER — ASPIRIN EC 81 MG PO TBEC
81.0000 mg | DELAYED_RELEASE_TABLET | Freq: Every day | ORAL | Status: DC
  Administered 2013-02-03 – 2013-02-07 (×5): 81 mg via ORAL
  Filled 2013-02-02 (×5): qty 1

## 2013-02-02 MED ORDER — SODIUM CHLORIDE 0.9 % IV SOLN: INTRAVENOUS | Status: DC | PRN

## 2013-02-02 MED ORDER — SODIUM CHLORIDE 0.9 % IJ SOLN: 3.0000 mL | INTRAMUSCULAR | Status: DC | PRN

## 2013-02-02 NOTE — ED Provider Notes (Signed)
CSN: [id number]     Arrival date & time 02/02/13  1715 History   First MD Initiated Contact with Patient 02/02/13 1753     Chief Complaint  Patient presents with  . myasthenia gravis relapse    (Consider location/radiation/quality/duration/timing/severity/associated sxs/prior Treatment) The history is provided by the patient.   72 year old male has a history of myasthenia gravis which had been quiesced in for over 2 years. Nevertheless 2 weeks, he started having recurrence of symptoms which include diplopia, difficulty chewing, weakness in his arms and legs and on difficulty with balance. He is on home dialysis 4 days a week. His neurologist had started him on Mestinon but had side effect of diarrhea she started him on prednisone. He had 5 mg of prednisone 3 days ago, 10 mg yesterday and 5 mg today. His symptoms are continuing to worsen so he was sent to the ED. He denies fever, chills, sweats. Of note, he has been having chest pressure while he is on dialysis and a pressure stops as soon as he goes off of dialysis. He also has been having mild dyspnea while he is having his dialysis. That has been ongoing for about one year.  Past Medical History  Diagnosis Date  . Renal insufficiency   . Hemodialysis patient     Monday, Wednesday and Friday  . HIT (heparin-induced thrombocytopenia)   . Myasthenia gravis   . Hypertension   . History of pyelonephritis   . History of nephrolithiasis   . Celiac sprue   . Hearing deficit     left hearing aid  . Neuropathy     Peripheral neuropathy  . Pancreatic cyst    Past Surgical History  Procedure Laterality Date  . Exploratory laparotomy    . Transurethral resection of prostate    . Cholecystectomy    . Av fistula placement, radiocephalic  08/03/10    Left  . Basilic vein transposition  10/06/10    2nd stage Left arm  . Hematoma evacuation  11/23/10    Left Arm    Family History  Problem Relation Age of Onset  . Stroke Mother   . Diabetes  Mother   . Heart disease Brother    History  Substance Use Topics  . Smoking status: Former Smoker    Types: Cigarettes    Quit date: 05/30/1969  . Smokeless tobacco: Not on file  . Alcohol Use: No    Review of Systems  All other systems reviewed and are negative.    Allergies  Avelox; Gluten; Ciprofloxacin; Erythromycin; Gemifloxacin; Gentamicin; Heparin; Kanamycin; Levofloxacin; Mestinon; Neomycin; Norfloxacin; Streptomycin; and Tobramycin  Home Medications   Current Outpatient Rx  Name  Route  Sig  Dispense  Refill  . aspirin 81 MG tablet   Oral   Take 162 mg by mouth daily.          . B Complex-C-Folic Acid (DIALYVITE 800 PO)   Oral   Take 1 tablet by mouth daily.         . calcium acetate (PHOSLO) 667 MG capsule   Oral   Take 1,334-2,668 mg by mouth See admin instructions. Takes 2 capsules with snacks and 3 capsules with meals         . gabapentin (NEURONTIN) 300 MG capsule   Oral   Take 300 mg by mouth at bedtime as needed. For feet/nerve pain         . ibuprofen (ADVIL,MOTRIN) 200 MG tablet   Oral   Take 200  mg by mouth 3 (three) times a week.         Marland Kitchen omeprazole (PRILOSEC) 20 MG capsule   Oral   Take 20 mg by mouth daily.         . prednisoLONE 5 MG TABS tablet   Oral   Take 5 mg by mouth 2 (two) times daily.          BP 141/69  Pulse 59  Temp(Src) 98 F (36.7 C) (Oral)  Resp 18  Ht 5\' 11"  (1.803 m)  Wt 190 lb (86.183 kg)  BMI 26.51 kg/m2  SpO2 100% Physical Exam  Nursing note and vitals reviewed.  72 year old male, resting comfortably and in no acute distress. Vital signs are significant for mild bradycardia with heart rate 59, and borderline hypertension with blood pressure 141/69. Oxygen saturation is 100%, which is normal. Head is normocephalic and atraumatic. PERRLA. Eyes are disconjugate and he has difficulty cooperating with EOM exam but there no gross abnormalities in extraocular movements. Oropharynx is clear. Neck is  nontender and supple without adenopathy or JVD. Back is nontender and there is no CVA tenderness. Lungs are clear without rales, wheezes, or rhonchi. Chest is nontender. Heart has regular rate and rhythm without murmur. Abdomen is soft, flat, nontender without masses or hepatosplenomegaly and peristalsis is normoactive. Extremities have no cyanosis or edema, full range of motion is present. Skin is warm and dry without rash. Neurologic: Mental status is normal, speech is mildly dysarthric, cranial nerves are intact. There is mild weakness of arms and legs with strength 4/5 and no focal weakness.  ED Course  Procedures (including critical care time) Labs Review Results for orders placed during the hospital encounter of 02/02/13  CBC WITH DIFFERENTIAL      Result Value Range   WBC 8.1  4.0 - 10.5 K/uL   RBC 3.20 (*) 4.22 - 5.81 MIL/uL   Hemoglobin 10.8 (*) 13.0 - 17.0 g/dL   HCT 45.4 (*) 09.8 - 11.9 %   MCV 97.5  78.0 - 100.0 fL   MCH 33.8  26.0 - 34.0 pg   MCHC 34.6  30.0 - 36.0 g/dL   RDW 14.7  82.9 - 56.2 %   Platelets 173  150 - 400 K/uL   Neutrophils Relative % 75  43 - 77 %   Neutro Abs 6.1  1.7 - 7.7 K/uL   Lymphocytes Relative 18  12 - 46 %   Lymphs Abs 1.4  0.7 - 4.0 K/uL   Monocytes Relative 6  3 - 12 %   Monocytes Absolute 0.5  0.1 - 1.0 K/uL   Eosinophils Relative 1  0 - 5 %   Eosinophils Absolute 0.1  0.0 - 0.7 K/uL   Basophils Relative 0  0 - 1 %   Basophils Absolute 0.0  0.0 - 0.1 K/uL  COMPREHENSIVE METABOLIC PANEL      Result Value Range   Sodium 137  135 - 145 mEq/L   Potassium 4.3  3.5 - 5.1 mEq/L   Chloride 101  96 - 112 mEq/L   CO2 26  19 - 32 mEq/L   Glucose, Bld 109 (*) 70 - 99 mg/dL   BUN 29 (*) 6 - 23 mg/dL   Creatinine, Ser 1.30 (*) 0.50 - 1.35 mg/dL   Calcium 9.2  8.4 - 86.5 mg/dL   Total Protein 6.3  6.0 - 8.3 g/dL   Albumin 3.3 (*) 3.5 - 5.2 g/dL   AST 13  0 - 37 U/L   ALT 12  0 - 53 U/L   Alkaline Phosphatase 66  39 - 117 U/L   Total  Bilirubin 0.4  0.3 - 1.2 mg/dL   GFR calc non Af Amer 7 (*) >90 mL/min   GFR calc Af Amer 8 (*) >90 mL/min  POCT I-STAT TROPONIN I      Result Value Range   Troponin i, poc 0.01  0.00 - 0.08 ng/mL   Comment 3            Dg Chest 2 View  02/02/2013   *RADIOLOGY REPORT*  Clinical Data: Chest pain.  CHEST - 2 VIEW  Comparison: Chest x-ray 09/24/2012.  Findings: Minimal scarring in the periphery of the left lung base, unchanged.  No acute consolidative airspace disease.  No pleural effusions.  No evidence of pulmonary edema.  Heart size is normal. Mediastinal contours are unremarkable.  Numerous surgical clips near the gastroesophageal junction.  Surgical clips are also noted over the right upper quadrant the abdomen, compatible with prior cholecystectomy.  IMPRESSION: 1.  No radiographic evidence of acute cardiopulmonary disease.   Original Report Authenticated By: Trudie Reed, M.D.     ECG shows normal sinus rhythm with a rate of 61, no ectopy. Normal axis. Normal P wave. Normal QRS. Normal intervals. Normal ST and T waves. Impression: normal ECG. When compared with ECG of 09/16/2012, no significant changes are seen.  MDM   1. Myasthenia gravis with acute exacerbation   2. End stage renal disease on dialysis    Exacerbation of myasthenia gravis which has thus far failed to respond to outpatient management. Old records are reviewed and apparently he was started on Mestinon but had side effect of diarrhea and was unable to tolerate even very small doses of Mestinon. He was started on prednisone, but has failed to respond and was directed to come here.  Case is been discussed with Dr., Luisa Hart of neurology service he requested patient be admitted to the medicine service because of his renal failure. Case is discussed with Dr. Onalee Hua of [facility] who agrees to admit the patient.  Dione Booze, MD 02/02/13 [phone]

## 2013-02-02 NOTE — ED Notes (Addendum)
Patient requests to take home medications (prednisone). Dr. Preston Fleeting notified and went to bedside. Per Dr. Preston Fleeting patient is not NPO and may drink, Dr. Preston Fleeting is aware that patient has chewing difficulties. Declines to order swallow screen.   Gluten Free Meal tray ordered.

## 2013-02-02 NOTE — ED Notes (Signed)
Assumed Care. Initial contact. Patient resting. Returned from [employer]. Awaiting neuro hospitalist consult.

## 2013-02-02 NOTE — H&P (Signed)
PCP:   [name], MD   Chief Complaint:  Blurred vision  HPI: 72 yo male h/o esrd dialysis 4 x weekly, myasthenia gravis comes in with one week of blurred vision, difficulty in swallowing usual for his myasthenia gravis flares,  Recently saw his neurologist started on oral prednisone (today is day 3), was placed on mystinon but did not tolerate it due to side affects.  No sob or fevers.  Instructed to come to ED by neurology.  Due for dialysis tomorrow.  Review of Systems:  Positive and negative as per HPI otherwise all other systems are negative  Past Medical History: Past Medical History  Diagnosis Date  . Renal insufficiency   . Hemodialysis patient     Monday, Wednesday and Friday  . HIT (heparin-induced thrombocytopenia)   . Myasthenia gravis   . Hypertension   . History of pyelonephritis   . History of nephrolithiasis   . Celiac sprue   . Hearing deficit     left hearing aid  . Neuropathy     Peripheral neuropathy  . Pancreatic cyst    Past Surgical History  Procedure Laterality Date  . Exploratory laparotomy    . Transurethral resection of prostate    . Cholecystectomy    . Av fistula placement, radiocephalic  08/03/10    Left  . Basilic vein transposition  10/06/10    2nd stage Left arm  . Hematoma evacuation  11/23/10    Left Arm     Medications: Prior to Admission medications   Medication Sig Start Date End Date Taking? Authorizing Provider  aspirin 81 MG tablet Take 162 mg by mouth daily.    Yes Historical Provider, MD  B Complex-C-Folic Acid (DIALYVITE 800 PO) Take 1 tablet by mouth daily.   Yes Historical Provider, MD  calcium acetate (PHOSLO) 667 MG capsule Take 1,334-2,001 mg by mouth 5 (five) times daily. Takes 2 capsules with snacks and 3 capsules with meals   Yes Historical Provider, MD  gabapentin (NEURONTIN) 300 MG capsule Take 300 mg by mouth at bedtime as needed. For feet/nerve pain   Yes Historical Provider, MD  ibuprofen  (ADVIL,MOTRIN) 200 MG tablet Take 200 mg by mouth 3 (three) times a week.   Yes Historical Provider, MD  omeprazole (PRILOSEC) 20 MG capsule Take 20 mg by mouth daily.   Yes Historical Provider, MD  prednisoLONE 5 MG TABS tablet Take 5 mg by mouth 2 (two) times daily.   Yes Historical Provider, MD    Allergies:   Allergies  Allergen Reactions  . Avelox [Moxifloxacin Hcl In Nacl]     Pt quit breathing  . Gluten   . Ciprofloxacin Other (See Comments)    myasthenia  . Erythromycin Other (See Comments)    myasthenia  . Gemifloxacin Other (See Comments)    myasthenia  . Gentamicin Other (See Comments)    myasthenia  . Heparin Other (See Comments)    myasthenia  . Kanamycin Other (See Comments)    myasthenia  . Levofloxacin Other (See Comments)    myasthenia  . 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 Neomycin Other (See Comments)    myasthenia  . Norfloxacin Other (See Comments)    myasthenia  . Streptomycin Other (See Comments)    myasthenia  . Tobramycin Other (See Comments)    myasthenia    Social History:  reports that he quit smoking about 43 years ago. His smoking  use included Cigarettes. He smoked 0.00 packs per day. He does not have any smokeless tobacco history on file. He reports that he does not drink alcohol or use illicit drugs.  Family History: Family History  Problem Relation Age of Onset  . Stroke Mother   . Diabetes Mother   . Heart disease Brother     Physical Exam: Filed Vitals:   02/02/13 1729 02/02/13 1745 02/02/13 1745  BP: 145/56 141/69 141/69  Pulse: 59 56   Temp: 98 F (36.7 C)    TempSrc: Oral    Resp: 16 15 18   Height: 5\' 11"  (1.803 m)    Weight: 86.183 kg (190 lb)    SpO2: 96% 100% 100%   General appearance: alert, cooperative and no distress Head: Normocephalic, without obvious abnormality, atraumatic Eyes: negative Nose: Nares normal. Septum midline. Mucosa normal.  No drainage or sinus tenderness. Neck: no JVD and supple, symmetrical, trachea midline Lungs: clear to auscultation bilaterally Heart: regular rate and rhythm, S1, S2 normal, no murmur, click, rub or gallop Abdomen: soft, non-tender; bowel sounds normal; no masses,  no organomegaly Extremities: extremities normal, atraumatic, no cyanosis or edema Pulses: 2+ and symmetric Skin: Skin color, texture, turgor normal. No rashes or lesions Neurologic: Grossly normal    Labs on Admission:   Recent Labs  02/02/13 1805  NA 137  K 4.3  CL 101  CO2 26  GLUCOSE 109*  BUN 29*  CREATININE 6.89*  CALCIUM 9.2    Recent Labs  02/02/13 1805  AST 13  ALT 12  ALKPHOS 66  BILITOT 0.4  PROT 6.3  ALBUMIN 3.3*    Recent Labs  02/02/13 1805  WBC 8.1  NEUTROABS 6.1  HGB 10.8*  HCT 31.2*  MCV 97.5  PLT 173    Radiological Exams on Admission: Dg Chest 2 View  02/02/2013   *RADIOLOGY REPORT*  Clinical Data: Chest pain.  CHEST - 2 VIEW  Comparison: Chest x-ray 09/24/2012.  Findings: Minimal scarring in the periphery of the left lung base, unchanged.  No acute consolidative airspace disease.  No pleural effusions.  No evidence of pulmonary edema.  Heart size is normal. Mediastinal contours are unremarkable.  Numerous surgical clips near the gastroesophageal junction.  Surgical clips are also noted over the right upper quadrant the abdomen, compatible with prior cholecystectomy.  IMPRESSION: 1.  No radiographic evidence of acute cardiopulmonary disease.   Original Report Authenticated By: Trudie Reed, M.D.    Assessment/Plan  72 yo male with myasthenia flare, esrd Principal Problem:   Myasthenia gravis with acute exacerbation Active Problems:   End stage renal disease on dialysis   HIT (heparin-induced thrombocytopenia)   Hypertension  Neurology called to see tonight.  Further management per neuro team concerning treatment for myasthenia flare.  Will need dialysis tomorrow.  Left  message on dialysis unit.  Stable.  Hold hep products due to h/o HIT.  Tele floor.  Full code.  Avett Reineck A 02/02/2013, 7:45 PM

## 2013-02-02 NOTE — Consult Note (Signed)
Reason for Consult: Exacerbation of myasthenia gravis.  HPI:                                                                                                                                          Adam Walsh is an 72 y.o. male history of myasthenia gravis since late 2011, end stage renal failure on hemodialysis, hypertension, celiac sprue and neuropathy, presenting with progressive diplopia and eyelid ptosis as well as increasing difficulty with being able to chew. He's also noted speech changes with dysarthria. He was recently given Mestinon 30 mg every 6 hours which improved his symptoms but caused intolerable gastrointestinal side effects. He was started on prednisone 2 days ago at 5 mg with plan to incrementally increase prednisone as tolerated and needed. No improvement has been noted and because of his progressive bulbar symptoms he was referred for admission for treatment intervention with IVIG. He has not experienced any recent febrile illness and no flulike symptoms.  Past Medical History  Diagnosis Date  . Renal insufficiency   . Hemodialysis patient     Monday, Wednesday and Friday  . HIT (heparin-induced thrombocytopenia)   . Myasthenia gravis   . Hypertension   . History of pyelonephritis   . History of nephrolithiasis   . Celiac sprue   . Hearing deficit     left hearing aid  . Neuropathy     Peripheral neuropathy  . Pancreatic cyst     Past Surgical History  Procedure Laterality Date  . Exploratory laparotomy    . Transurethral resection of prostate    . Cholecystectomy    . Av fistula placement, radiocephalic  08/03/10    Left  . Basilic vein transposition  10/06/10    2nd stage Left arm  . Hematoma evacuation  11/23/10    Left Arm     Family History  Problem Relation Age of Onset  . Stroke Mother   . Diabetes Mother   . Heart disease Brother     Social History:  reports that he quit smoking about 43 years ago. His smoking use included Cigarettes. He  smoked 0.00 packs per day. He does not have any smokeless tobacco history on file. He reports that he does not drink alcohol or use illicit drugs.  Allergies  Allergen Reactions  . Avelox [Moxifloxacin Hcl In Nacl]     Pt quit breathing  . Gluten   . Ciprofloxacin Other (See Comments)    myasthenia  . Erythromycin Other (See Comments)    myasthenia  . Gemifloxacin Other (See Comments)    myasthenia  . Gentamicin Other (See Comments)    myasthenia  . Heparin Other (See Comments)    myasthenia  . Kanamycin Other (See Comments)    myasthenia  . Levofloxacin Other (See Comments)    myasthenia  . Mestinon [Pyridostigmine] Other (See Comments)    Reacts with  his myasthenia graves disease/ diarrhea  . Neomycin Other (See Comments)    myasthenia  . Norfloxacin Other (See Comments)    myasthenia  . Streptomycin Other (See Comments)    myasthenia  . Tobramycin Other (See Comments)    myasthenia    MEDICATIONS:                                                                                                                     I have reviewed the patient's current medications.   ROS:                                                                                                                                       History obtained from spouse and the patient  General ROS: negative for - chills, fatigue, fever, night sweats, weight gain or weight loss Psychological ROS: negative for - behavioral disorder, hallucinations, memory difficulties, mood swings or suicidal ideation Ophthalmic ROS: Positive for progressive diplopia ENT ROS: negative for - epistaxis, nasal discharge, oral lesions, sore throat, tinnitus or vertigo Allergy and Immunology ROS: negative for - hives or itchy/watery eyes Hematological and Lymphatic ROS: negative for - bleeding problems, bruising or swollen lymph nodes Endocrine ROS: negative for - galactorrhea, hair pattern changes, polydipsia/polyuria or  temperature intolerance Respiratory ROS: negative for - cough, hemoptysis, shortness of breath or wheezing Cardiovascular ROS: negative for - chest pain, dyspnea on exertion, edema or irregular heartbeat Gastrointestinal ROS: negative for - abdominal pain, diarrhea, hematemesis, nausea/vomiting or stool incontinence Genito-Urinary ROS: negative for - dysuria, hematuria, incontinence or urinary frequency/urgency Musculoskeletal ROS: negative for - joint swelling or muscular weakness Neurological ROS: as noted in HPI Dermatological ROS: negative for rash and skin lesion changes   Blood pressure 141/69, pulse 56, temperature 98 F (36.7 C), temperature source Oral, resp. rate 18, height 5\' 11"  (1.803 m), weight 86.183 kg (190 lb), SpO2 100.00%.   Neurologic Examination:  Mental Status: Alert, oriented, thought content appropriate.  Speech moderately dysarthric without evidence of aphasia. Able to follow commands without difficulty. Cranial Nerves: II-Visual fields were normal. III/IV/VI-Pupils were equal and reacted. Bilateral eyelid ptosis noted, right greater than left. Extraocular movements were full and conjugate, except for reduced abduction of left thigh on left lateral gaze with associated diplopia.    V/VII-no facial numbness; moderate upper and lower facial muscle weakness bilaterally, including orbicularis oculi, frontalis and orbicularis oris muscles. VIII-moderately reduced hearing acuity. X-moderately dysarthric speech; symmetrical palatal movement. Motor: 4/5 neck flexor strength; 5/5 neck extensors strength; 5-/5 strength of deltoid muscles bilaterally; extremity strength otherwise normal throughout. Muscle tone was normal throughout Sensory: Normal throughout. Deep Tendon Reflexes: 1+ and symmetric. Plantars: Mute bilaterally Cerebellar: Normal finger-to-nose testing.   No  results found for this basename: cbc, bmp, coags, chol, tri, ldl, hga1c    Results for orders placed during the hospital encounter of 02/02/13 (from the past 48 hour(s))  CBC WITH DIFFERENTIAL     Status: Abnormal   Collection Time    02/02/13  6:05 PM      Result Value Range   WBC 8.1  4.0 - 10.5 K/uL   RBC 3.20 (*) 4.22 - 5.81 MIL/uL   Hemoglobin 10.8 (*) 13.0 - 17.0 g/dL   HCT 40.9 (*) 81.1 - 91.4 %   MCV 97.5  78.0 - 100.0 fL   MCH 33.8  26.0 - 34.0 pg   MCHC 34.6  30.0 - 36.0 g/dL   RDW 78.2  95.6 - 21.3 %   Platelets 173  150 - 400 K/uL   Neutrophils Relative % 75  43 - 77 %   Neutro Abs 6.1  1.7 - 7.7 K/uL   Lymphocytes Relative 18  12 - 46 %   Lymphs Abs 1.4  0.7 - 4.0 K/uL   Monocytes Relative 6  3 - 12 %   Monocytes Absolute 0.5  0.1 - 1.0 K/uL   Eosinophils Relative 1  0 - 5 %   Eosinophils Absolute 0.1  0.0 - 0.7 K/uL   Basophils Relative 0  0 - 1 %   Basophils Absolute 0.0  0.0 - 0.1 K/uL  COMPREHENSIVE METABOLIC PANEL     Status: Abnormal   Collection Time    02/02/13  6:05 PM      Result Value Range   Sodium 137  135 - 145 mEq/L   Potassium 4.3  3.5 - 5.1 mEq/L   Chloride 101  96 - 112 mEq/L   CO2 26  19 - 32 mEq/L   Glucose, Bld 109 (*) 70 - 99 mg/dL   BUN 29 (*) 6 - 23 mg/dL   Creatinine, Ser 0.86 (*) 0.50 - 1.35 mg/dL   Calcium 9.2  8.4 - 57.8 mg/dL   Total Protein 6.3  6.0 - 8.3 g/dL   Albumin 3.3 (*) 3.5 - 5.2 g/dL   AST 13  0 - 37 U/L   ALT 12  0 - 53 U/L   Alkaline Phosphatase 66  39 - 117 U/L   Total Bilirubin 0.4  0.3 - 1.2 mg/dL   GFR calc non Af Amer 7 (*) >90 mL/min   GFR calc Af Amer 8 (*) >90 mL/min   Comment: (NOTE)     The eGFR has been calculated using the CKD EPI equation.     This calculation has not been validated in all clinical situations.     eGFR's persistently <90 mL/min signify  possible Chronic Kidney     Disease.  POCT I-STAT TROPONIN I     Status: None   Collection Time    02/02/13  6:12 PM      Result Value Range    Troponin i, poc 0.01  0.00 - 0.08 ng/mL   Comment 3            Comment: Due to the release kinetics of cTnI,     a negative result within the first hours     of the onset of symptoms does not rule out     myocardial infarction with certainty.     If myocardial infarction is still suspected,     repeat the test at appropriate intervals.    Dg Chest 2 View  02/02/2013   *RADIOLOGY REPORT*  Clinical Data: Chest pain.  CHEST - 2 VIEW  Comparison: Chest x-ray 09/24/2012.  Findings: Minimal scarring in the periphery of the left lung base, unchanged.  No acute consolidative airspace disease.  No pleural effusions.  No evidence of pulmonary edema.  Heart size is normal. Mediastinal contours are unremarkable.  Numerous surgical clips near the gastroesophageal junction.  Surgical clips are also noted over the right upper quadrant the abdomen, compatible with prior cholecystectomy.  IMPRESSION: 1.  No radiographic evidence of acute cardiopulmonary disease.   Original Report Authenticated By: Trudie Reed, M.D.     Assessment/Plan: 72 year old man with myasthenia gravis, as well as chronic kidney disease on hemodialysis, presenting with progressive weakness with bulbar involvement primarily.  Recommendations: 1. IVIG 0.4 mg per kilogram per day for total of 5 treatments 2. Continue prednisone at 10 mg per day 3. Speech therapy consultation for evaluation and recommendations regarding speech and swallowing 4. Inpatient hemodialysis per patient's weekly schedule  We will continue to follow this patient closely with you.  C.R. Roseanne Reno, MD [facility] [phone]  02/02/2013, 8:43 PM

## 2013-02-02 NOTE — ED Notes (Signed)
PT sent here by [facility] d/t myasthenia gravis relapse. Pt has been free of symptoms since 2011. However 2 weeks ago he began experiencing symptoms again such as double vision, difficulty swallowing, etc.  PT also c/o chest pressure while in dialysis and difficulty breathing, though he states he is not experiencing that now.

## 2013-02-03 DIAGNOSIS — N186 End stage renal disease: Secondary | ICD-10-CM

## 2013-02-03 DIAGNOSIS — G7001 Myasthenia gravis with (acute) exacerbation: Secondary | ICD-10-CM

## 2013-02-03 DIAGNOSIS — D7582 Heparin induced thrombocytopenia (HIT): Secondary | ICD-10-CM

## 2013-02-03 LAB — BASIC METABOLIC PANEL
Chloride: 100 mEq/L (ref 96–112)
GFR calc Af Amer: 7 mL/min — ABNORMAL LOW (ref >90)
GFR calc non Af Amer: 6 mL/min — ABNORMAL LOW (ref >90)
Glucose, Bld: 91 mg/dL (ref 70–99)
Potassium: 3.9 mEq/L (ref 3.5–5.1)
Sodium: 135 mEq/L (ref 135–145)

## 2013-02-03 LAB — CBC
HCT: 28.6 % — ABNORMAL LOW (ref 39.0–52.0)
Hemoglobin: 9.9 g/dL — ABNORMAL LOW (ref 13.0–17.0)
RBC: 2.92 MIL/uL — ABNORMAL LOW (ref 4.22–5.81)

## 2013-02-03 MED ORDER — DARBEPOETIN ALFA-POLYSORBATE 25 MCG/0.42ML IJ SOLN
25.0000 ug | INTRAMUSCULAR | Status: DC
  Administered 2013-02-04: 25 ug via INTRAVENOUS
  Filled 2013-02-03: qty 0.42

## 2013-02-03 MED ORDER — LORAZEPAM 0.5 MG PO TABS
0.5000 mg | ORAL_TABLET | Freq: Once | ORAL | Status: AC
  Administered 2013-02-03: 03:00:00 0.5 mg via ORAL
  Filled 2013-02-03: qty 1

## 2013-02-03 MED ORDER — CLONAZEPAM 1 MG PO TABS
1.0000 mg | ORAL_TABLET | Freq: Two times a day (BID) | ORAL | Status: DC | PRN
  Administered 2013-02-03 – 2013-02-07 (×5): 1 mg via ORAL
  Filled 2013-02-03 (×5): qty 1

## 2013-02-03 MED ORDER — DOXERCALCIFEROL 4 MCG/2ML IV SOLN
1.0000 ug | INTRAVENOUS | Status: DC
  Administered 2013-02-04 – 2013-02-06 (×2): 1 ug via INTRAVENOUS
  Filled 2013-02-03 (×2): qty 2

## 2013-02-03 NOTE — Progress Notes (Signed)
Subjective: No significant cahnges.   Exam: Filed Vitals:   02/03/13 0947  BP: 136/65  Pulse: 58  Temp: 97.6 F (36.4 C)  Resp: 19   Gen: In bed, NAD MS: Awake, alert, oriented [id number], mildly dysconjugate gaze, dysarthria.  Motor: has good strength peripherally.  Sensory:intact to LT  UA - no infection  Impression: 72 yo M with MG exacerbation. Given his degree of bulbar involvement, he has been admitted for IVIg.   Recommendations: 1) Continue IVIG dose #2/5 today 2) Q12H NIF and VC 3) will start prednisone once response to IVIG starts in earnest.   Ritta Slot, MD [facility] [phone]  If 7pm- 7am, please page neurology on call at [phone].

## 2013-02-03 NOTE — Consult Note (Signed)
I have seen and examined this patient and agree with the plan of care. I have seen and examined this patient and agree with the plan of care. Patient admitted for an exacerbation of myasthenia Gravis. Evaluation for management of ESRD. Vitals are stable with no distress and well controlled volume status. Evaluation of out patient records indicates that he receives dialysis with next stage at home. Marland KitchenHe receives erythropoietin and iron to keep his Hb above 10 and IV Vitamin D and phosphate binders to keep his Bone mineral goals at [facility]. He has a functioning AVF with no problems during usage. We shall proceed with in hospital scheduled dialysis  WE shall use regular intermittent HD whilst in hospital [facility] W 02/03/2013, 1:56 PM

## 2013-02-03 NOTE — Progress Notes (Addendum)
Pt arrived via stretcher from ED. Vitals stable. Placed on Tele. Pt oriented to Floor, Unit and staff. Pt and family advised of visitors policy.  All orders implemented. IV team notified to setup IVIG. Will continue to monitor and assess.

## 2013-02-03 NOTE — Evaluation (Signed)
Clinical/Bedside Swallow Evaluation Patient Details  Name: LAYLA GRAMM MRN: [id number] Date of Birth: April 11, 1941  Today's Date: 02/03/2013 Time: 1330-1400 SLP Time Calculation (min): 30 min  Past Medical History:  Past Medical History  Diagnosis Date  . Renal insufficiency   . Hemodialysis patient     Monday, Wednesday and Friday  . HIT (heparin-induced thrombocytopenia)   . Myasthenia gravis   . Hypertension   . History of pyelonephritis   . History of nephrolithiasis   . Celiac sprue   . Hearing deficit     left hearing aid  . Neuropathy     Peripheral neuropathy  . Pancreatic cyst    Past Surgical History:  Past Surgical History  Procedure Laterality Date  . Exploratory laparotomy    . Transurethral resection of prostate    . Cholecystectomy    . Av fistula placement, radiocephalic  08/03/10    Left  . Basilic vein transposition  10/06/10    2nd stage Left arm  . Hematoma evacuation  11/23/10    Left Arm    HPI:  72 yo male h/o esrd dialysis 4 x weekly, myasthenia gravis comes in with one week of blurred vision, difficulty in swallowing usual for his myasthenia gravis flares,  Recently saw his neurologist started on oral prednisone (today is day 3), was placed on mystinon but did not tolerate it due to side affects.  No sob or fevers.  Instructed to come to ED by neurology.  Due for dialysis tomorrow.  BSE ordered due to patient reporting difficulty swallowing.     Assessment / Plan / Recommendation Clinical Impression  BSE completed.  Patient seen at noon meal.  Results of evaluation indicate oropharyngeal swallow functional for regular consistency solids and thin liquid.  No outward s/s of aspiration noted throughout evaluation.  Mastication extended with regular solids but effective with patient demonstrating awareness of need to modify bites. Suspect esophageal dysphagia due to reports of globus sensation.  Patient reports solids "sometimes slow to go down" s/p  EGD completed one month prior.  Recommend to continue current diet consistency with intermittent supervision with meals.  ST to follow briefly in acute care setting for diet tolerance.      Aspiration Risk  Mild    Diet Recommendation Regular;Thin liquid   Liquid Administration via: Cup;Straw Medication Administration: Whole meds with liquid Supervision: Patient able to self feed;Intermittent supervision to cue for compensatory strategies Compensations: Slow rate;Small sips/bites;Follow solids with liquid Postural Changes and/or Swallow Maneuvers: Seated upright 90 degrees;Upright 30-60 min after meal    Other  Recommendations Oral Care Recommendations: Oral care BID Other Recommendations: Clarify dietary restrictions   Follow Up Recommendations  None    Frequency and Duration min 1 x/week  1 week       SLP Swallow Goals Patient will utilize recommended strategies during swallow to increase swallowing safety with: Independent assistance   Swallow Study Prior Functional Status   Lives at home with spouse.    General Date of Onset: 02/02/13 HPI: 72 yo male h/o esrd dialysis 4 x weekly, myasthenia gravis comes in with one week of blurred vision, difficulty in swallowing usual for his myasthenia gravis flares,  Recently saw his neurologist started on oral prednisone (today is day 3), was placed on mystinon but did not tolerate it due to side affects.  No sob or fevers.  Instructed to come to ED by neurology.  Due for dialysis tomorrow. Type of Study: Bedside swallow evaluation Diet Prior  to this Study: Regular;Thin liquids Temperature Spikes Noted: No Respiratory Status: Room air History of Recent Intubation: No Behavior/Cognition: Alert;Cooperative;Pleasant mood Oral Cavity - Dentition: Dentures, top;Dentures, bottom Self-Feeding Abilities: Able to feed self;Needs assist Patient Positioning: Upright in bed Baseline Vocal Quality: Clear Volitional Cough: Strong Volitional  Swallow: Able to elicit    Oral/Motor/Sensory Function Overall Oral Motor/Sensory Function: Appears within functional limits for tasks assessed   Ice Chips Ice chips: Within functional limits   Thin Liquid Thin Liquid: Within functional limits    Nectar Thick Nectar Thick Liquid: Not tested   Honey Thick Honey Thick Liquid: Not tested   Puree Puree: Within functional limits Presentation: Self Fed;Spoon   Solid   GO    Solid: Impaired Other Comments: extended mastication      Moreen Fowler MS, CCC-SLP [phone] [facility] 02/03/2013,2:36 PM

## 2013-02-03 NOTE — Progress Notes (Signed)
Pt complaining of experiencing two episodes of transient dyspnea, a "tightness in chest." Vitals remain stable BP 141/64, 60bmp, O2 99% on room air, respirations at 20/min. MD Notified. MD ordered 0.5 Ativan PO. Will admin. and continue to assess.

## 2013-02-03 NOTE — Consult Note (Signed)
[facility] Renal Consultation Note  Indication for Consultation:  Management of ESRD/hemodialysis; anemia, hypertension/volume and secondary hyperparathyroidism  HPI: Adam Walsh is a 72 y.o. male with ESRD, initially on dialysis at the [facility], now on home dialysis using NxStage on MWFS, who presented to the ED yesterday with two weeks of progressive ophthalmoplegia, weakness, and speech changes with dysarthria secondary to exacerbation of myasthenia gravis, which was first diagnosed in 03/2010.  He had not had symptoms since 06/2010 and had not required medications until his neurologist, Dr. [name], on 8/26 tried Mestinon, which improved his symptoms, but had to be stopped on 9/2 secondary to abdominal cramping and diarrhea.  Prednisone was then started, but the patient's symptoms worsened until he came to the hospital, where treatment with IVIG per day for five total doses was started.  He last had dialysis at home on Friday 9/5, but is currently stable.  Dialysis Orders:  Home HD using NxStage with PureFlow on MWFS BFR 400   EDW 90 kg   AVF @ LUA    Epogen 2000 U on Fri   Venofer 200 mg every 4 wks   Zemplar 2 mcg 3x/wk  Past Medical History  Diagnosis Date  . Renal insufficiency   . Hemodialysis patient     Monday, Wednesday and Friday  . HIT (heparin-induced thrombocytopenia)   . Myasthenia gravis   . Hypertension   . History of pyelonephritis   . History of nephrolithiasis   . Celiac sprue   . Hearing deficit     left hearing aid  . Neuropathy     Peripheral neuropathy  . Pancreatic cyst    Past Surgical History  Procedure Laterality Date  . Exploratory laparotomy    . Transurethral resection of prostate    . Cholecystectomy    . Av fistula placement, radiocephalic  08/03/10    Left  . Basilic vein transposition  10/06/10    2nd stage Left arm  . Hematoma evacuation  11/23/10    Left Arm    Family History  Problem Relation Age of  Onset  . Stroke Mother   . Diabetes Mother   . Heart disease Brother    Social History He quit smoking cigarettes about 43 years ago and never used smokeless tobacco. He denies any history of alcohol or illicit drugs.  Allergies  Allergen Reactions  . Avelox [Moxifloxacin Hcl In Nacl]     Pt quit breathing  . Gluten   . Ciprofloxacin Other (See Comments)    myasthenia  . Erythromycin Other (See Comments)    myasthenia  . Gemifloxacin Other (See Comments)    myasthenia  . Gentamicin Other (See Comments)    myasthenia  . Heparin Other (See Comments)    myasthenia  . Kanamycin Other (See Comments)    myasthenia  . Levofloxacin Other (See Comments)    myasthenia  . 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 Neomycin Other (See Comments)    myasthenia  . Norfloxacin Other (See Comments)    myasthenia  . Streptomycin Other (See Comments)    myasthenia  . Tobramycin Other (See Comments)    myasthenia   Prior to Admission medications   Medication Sig Start Date End Date Taking? Authorizing Provider  aspirin 81 MG tablet Take 162 mg by mouth daily.    Yes Historical Provider, MD  B Complex-C-Folic Acid (DIALYVITE 800 PO) Take 1 tablet by  mouth daily.   Yes Historical Provider, MD  calcium acetate (PHOSLO) 667 MG capsule Take 1,334-2,001 mg by mouth 5 (five) times daily. Takes 2 capsules with snacks and 3 capsules with meals   Yes Historical Provider, MD  gabapentin (NEURONTIN) 300 MG capsule Take 300 mg by mouth at bedtime as needed. For feet/nerve pain   Yes Historical Provider, MD  ibuprofen (ADVIL,MOTRIN) 200 MG tablet Take 200 mg by mouth 3 (three) times a week.   Yes Historical Provider, MD  omeprazole (PRILOSEC) 20 MG capsule Take 20 mg by mouth daily.   Yes Historical Provider, MD  prednisoLONE 5 MG TABS tablet Take 5 mg by mouth 2 (two) times daily.   Yes Historical Provider, MD   Labs:  Results for orders  placed during the hospital encounter of 02/02/13 (from the past 48 hour(s))  CBC WITH DIFFERENTIAL     Status: Abnormal   Collection Time    02/02/13  6:05 PM      Result Value Range   WBC 8.1  4.0 - 10.5 K/uL   RBC 3.20 (*) 4.22 - 5.81 MIL/uL   Hemoglobin 10.8 (*) 13.0 - 17.0 g/dL   HCT 16.1 (*) 09.6 - 04.5 %   MCV 97.5  78.0 - 100.0 fL   MCH 33.8  26.0 - 34.0 pg   MCHC 34.6  30.0 - 36.0 g/dL   RDW 40.9  81.1 - 91.4 %   Platelets 173  150 - 400 K/uL   Neutrophils Relative % 75  43 - 77 %   Neutro Abs 6.1  1.7 - 7.7 K/uL   Lymphocytes Relative 18  12 - 46 %   Lymphs Abs 1.4  0.7 - 4.0 K/uL   Monocytes Relative 6  3 - 12 %   Monocytes Absolute 0.5  0.1 - 1.0 K/uL   Eosinophils Relative 1  0 - 5 %   Eosinophils Absolute 0.1  0.0 - 0.7 K/uL   Basophils Relative 0  0 - 1 %   Basophils Absolute 0.0  0.0 - 0.1 K/uL  COMPREHENSIVE METABOLIC PANEL     Status: Abnormal   Collection Time    02/02/13  6:05 PM      Result Value Range   Sodium 137  135 - 145 mEq/L   Potassium 4.3  3.5 - 5.1 mEq/L   Chloride 101  96 - 112 mEq/L   CO2 26  19 - 32 mEq/L   Glucose, Bld 109 (*) 70 - 99 mg/dL   BUN 29 (*) 6 - 23 mg/dL   Creatinine, Ser 7.82 (*) 0.50 - 1.35 mg/dL   Calcium 9.2  8.4 - 95.6 mg/dL   Total Protein 6.3  6.0 - 8.3 g/dL   Albumin 3.3 (*) 3.5 - 5.2 g/dL   AST 13  0 - 37 U/L   ALT 12  0 - 53 U/L   Alkaline Phosphatase 66  39 - 117 U/L   Total Bilirubin 0.4  0.3 - 1.2 mg/dL   GFR calc non Af Amer 7 (*) >90 mL/min   GFR calc Af Amer 8 (*) >90 mL/min   Comment: (NOTE)     The eGFR has been calculated using the CKD EPI equation.     This calculation has not been validated in all clinical situations.     eGFR's persistently <90 mL/min signify possible Chronic Kidney     Disease.  POCT I-STAT TROPONIN I     Status: None   Collection  Time    02/02/13  6:12 PM      Result Value Range   Troponin i, poc 0.01  0.00 - 0.08 ng/mL   Comment 3            Comment: Due to the release  kinetics of cTnI,     a negative result within the first hours     of the onset of symptoms does not rule out     myocardial infarction with certainty.     If myocardial infarction is still suspected,     repeat the test at appropriate intervals.  BASIC METABOLIC PANEL     Status: Abnormal   Collection Time    02/03/13  5:30 AM      Result Value Range   Sodium 135  135 - 145 mEq/L   Potassium 3.9  3.5 - 5.1 mEq/L   Chloride 100  96 - 112 mEq/L   CO2 24  19 - 32 mEq/L   Glucose, Bld 91  70 - 99 mg/dL   BUN 33 (*) 6 - 23 mg/dL   Creatinine, Ser 1.61 (*) 0.50 - 1.35 mg/dL   Calcium 8.9  8.4 - 09.6 mg/dL   GFR calc non Af Amer 6 (*) >90 mL/min   GFR calc Af Amer 7 (*) >90 mL/min   Comment: (NOTE)     The eGFR has been calculated using the CKD EPI equation.     This calculation has not been validated in all clinical situations.     eGFR's persistently <90 mL/min signify possible Chronic Kidney     Disease.  CBC     Status: Abnormal   Collection Time    02/03/13  5:30 AM      Result Value Range   WBC 6.1  4.0 - 10.5 K/uL   RBC 2.92 (*) 4.22 - 5.81 MIL/uL   Hemoglobin 9.9 (*) 13.0 - 17.0 g/dL   HCT 04.5 (*) 40.9 - 81.1 %   MCV 97.9  78.0 - 100.0 fL   MCH 33.9  26.0 - 34.0 pg   MCHC 34.6  30.0 - 36.0 g/dL   RDW 91.4  78.2 - 95.6 %   Platelets 163  150 - 400 K/uL   Constitutional: negative for chills, fatigue, fevers and sweats Ears, nose, mouth, throat, and face: positive for diplopia, negative for earaches, nasal congestion and sore throat Respiratory: negative for cough, dyspnea on exertion, hemoptysis and sputum Cardiovascular: negative for chest pain, chest pressure/discomfort, exertional chest pressure/discomfort, orthopnea and palpitations Gastrointestinal: negative for abdominal pain, change in bowel habits, nausea and vomiting Genitourinary:negative, non-oliguric Musculoskeletal:negative for arthralgias, back pain, myalgias and neck pain Neurological: positive for gait  problems, weakness and speech changes with dysarthria, negative for headaches and paresthesia  Physical Exam: Filed Vitals:   02/03/13 0947  BP: 136/65  Pulse: 58  Temp: 97.6 F (36.4 C)  Resp: 19     General appearance: alert, cooperative and no distress Head: Normocephalic, without obvious abnormality, atraumatic Eyes: ophthalmoplegia with bilateral eyelid ptosis Neck: no adenopathy, no carotid bruit, no JVD and supple, symmetrical, trachea midline Resp: clear to auscultation bilaterally Cardio: regular rate and rhythm, S1, S2 normal, no murmur, click, rub or gallop GI: soft, non-tender; bowel sounds normal; no masses,  no organomegaly Extremities: extremities normal, atraumatic, no cyanosis or edema Neurologic: Grossly normal, speech with mild dysarthria Dialysis Access: AVF @ LUA    Assessment/Plan: 1. Myasthenia gravis - exacerbation over last 2 wks, improved with Mestinon,  but stopped 9/2 sec to GI symptoms; remains on Prednisone, starting IVIG per day x 5 per Neurology (to be given during last hr of HD or post-HD). 2. ESRD - Home dialysis using NxStage on MWFS, last Tx on 9/5; K 3.9.  HD in hospital tomorrow.  3. Hypertension/volume - BP 136/65, stable without meds; current wt 86.1 kg, chest x-ray negative, non-oliguric. 4. Anemia - Hgb 9.9, outpatient Epogen 2000 U qwk, last T-sat 44% on Venofer 200 mg q4wks.  Aranesp 25 mcg tomorrow. 5. Metabolic bone disease -  Ca 8.9, last P 3.8 & iPTH 117; Zemplar 2 mcg 3x/wk, Phoslo 3 with meals.  Hectorol 1 mcg per HD. 6. Nutrition - Last Alb 3.8, renal vitamin. 7. Hx HIT - no Heparin with HD. 8. 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  Mckensi Redinger 02/03/2013, 10:39 AM   Attending Nephrologist: Elvis Coil, MD

## 2013-02-03 NOTE — Progress Notes (Signed)
[facility] PROGRESS NOTE  Adam Walsh ZOX:[id number] DOB: [birth year] DOA: 02/02/2013 PCP: [name], MD  Assessment/Plan:  Myasthenia gravis with acute exacerbation  - Appreciate neurology consult and following  - Pt has been started on IVIG x 5 doses  - Continuing prednisone 10 mg po daily - Speech Therapy Eval for swallowing assessment and evaluation - Mestinon was not tolerated by patient   End stage renal disease on dialysis  - The nephrology team notified of admission and consulted  HIT (heparin-induced thrombocytopenia)  - avoid heparin based products at this time  Hypertension - currently BPs are controlled, following  Diarrhea - no recent diarrhea, monitor I/Os and fluids  HPI/Subjective: Pt reports that he has been cold in his room.  He reports that he feels cold all the time at home as well.   Objective: Filed Vitals:   02/03/13 0355  BP: 129/73  Pulse: 55  Temp: 97.5 F (36.4 C)  Resp: 16    Intake/Output Summary (Last 24 hours) at 02/03/13 8119 Last data filed at 02/02/13 2249  Gross per 24 hour  Intake      3 ml  Output      0 ml  Net      3 ml   Filed Weights   02/02/13 1729  Weight: 86.183 kg (190 lb)    Exam:  General appearance: alert, cooperative and no distress  Head: Normocephalic, without obvious abnormality, atraumatic  Eyes: mild ptosis seen bilateral. Nose: Nares normal. Septum midline. Mucosa normal. No drainage or sinus tenderness.  Neck: no JVD and supple, symmetrical, trachea midline  Lungs: clear to auscultation bilaterally  Heart: regular rate and rhythm, S1, S2 normal, no murmur, click, rub or gallop  Abdomen: soft, non-tender; bowel sounds normal; no masses, no organomegaly  Extremities: extremities normal, atraumatic, no cyanosis or edema Pulses: 2+ and symmetric  Skin: Skin color, texture, turgor normal. No rashes or lesions  Neurologic: 4/5 neck flexor strength; 5/5 neck extensors strength; 5-/5  strength of deltoid muscles bilaterally; extremity strength otherwise normal  Data Reviewed: Basic Metabolic Panel:  Recent Labs Lab 02/02/13 1805 02/03/13 0530  NA 137 135  K 4.3 3.9  CL 101 100  CO2 26 24  GLUCOSE 109* 91  BUN 29* 33*  CREATININE 6.89* 7.78*  CALCIUM 9.2 8.9   Liver Function Tests:  Recent Labs Lab 02/02/13 1805  AST 13  ALT 12  ALKPHOS 66  BILITOT 0.4  PROT 6.3  ALBUMIN 3.3*   No results found for this basename: LIPASE, AMYLASE,  in the last 168 hours No results found for this basename: AMMONIA,  in the last 168 hours CBC:  Recent Labs Lab 02/02/13 1805 02/03/13 0530  WBC 8.1 6.1  NEUTROABS 6.1  --   HGB 10.8* 9.9*  HCT 31.2* 28.6*  MCV 97.5 97.9  PLT 173 163   Cardiac Enzymes: No results found for this basename: CKTOTAL, CKMB, CKMBINDEX, TROPONINI,  in the last 168 hours BNP (last 3 results) No results found for this basename: PROBNP,  in the last 8760 hours CBG: No results found for this basename: GLUCAP,  in the last 168 hours  No results found for this or any previous visit (from the past 240 hour(s)).   Studies: Dg Chest 2 View  02/02/2013   *RADIOLOGY REPORT*  Clinical Data: Chest pain.  CHEST - 2 VIEW  Comparison: Chest x-ray 09/24/2012.  Findings: Minimal scarring in the periphery of the left lung base, unchanged.  No  acute consolidative airspace disease.  No pleural effusions.  No evidence of pulmonary edema.  Heart size is normal. Mediastinal contours are unremarkable.  Numerous surgical clips near the gastroesophageal junction.  Surgical clips are also noted over the right upper quadrant the abdomen, compatible with prior cholecystectomy.  IMPRESSION: 1.  No radiographic evidence of acute cardiopulmonary disease.   Original Report Authenticated By: Trudie Reed, M.D.    Scheduled Meds: . aspirin EC  81 mg Oral Daily  . IMMUNE GLOBULIN 10% (HUMAN) IV - For Fluid Restriction Only  30 g Intravenous Daily  . sodium chloride  3  mL Intravenous Q12H   Continuous Infusions:   Principal Problem:   Myasthenia gravis with acute exacerbation Active Problems:   End stage renal disease on dialysis   HIT (heparin-induced thrombocytopenia)   Hypertension   Adam Walsh [employer] [phone]. If 7PM-7AM, please contact night-coverage at [email or web address], password [facility] 02/03/2013, 8:33 AM  LOS: 1 day

## 2013-02-03 NOTE — Progress Notes (Signed)
**Note De-Identified  Obfuscation** RT note: VC 2.0 L and NIF -30.

## 2013-02-04 LAB — HEPATITIS B SURFACE ANTIGEN: Hepatitis B Surface Ag: NEGATIVE

## 2013-02-04 LAB — RENAL FUNCTION PANEL
Albumin: 3.2 g/dL — ABNORMAL LOW (ref 3.5–5.2)
BUN: 49 mg/dL — ABNORMAL HIGH (ref 6–23)
CO2: 19 mEq/L (ref 19–32)
Chloride: 102 mEq/L (ref 96–112)
GFR calc non Af Amer: 5 mL/min — ABNORMAL LOW (ref >90)
Potassium: 5 mEq/L (ref 3.5–5.1)

## 2013-02-04 LAB — CBC
HCT: 31.6 % — ABNORMAL LOW (ref 39.0–52.0)
RBC: 3.27 MIL/uL — ABNORMAL LOW (ref 4.22–5.81)
RDW: 12.8 % (ref 11.5–15.5)
WBC: 6 10*3/uL (ref 4.0–10.5)

## 2013-02-04 MED ORDER — SODIUM CHLORIDE 0.9 % IV SOLN: 100.0000 mL | INTRAVENOUS | Status: DC | PRN

## 2013-02-04 MED ORDER — DOXERCALCIFEROL 4 MCG/2ML IV SOLN
INTRAVENOUS | Status: AC
  Administered 2013-02-04: 1 ug via INTRAVENOUS
  Filled 2013-02-04: qty 2

## 2013-02-04 MED ORDER — HEPARIN SODIUM (PORCINE) 1000 UNIT/ML DIALYSIS: 1000.0000 [IU] | INTRAMUSCULAR | Status: DC | PRN

## 2013-02-04 MED ORDER — ZOLPIDEM TARTRATE 5 MG PO TABS
5.0000 mg | ORAL_TABLET | Freq: Every evening | ORAL | Status: DC | PRN
  Administered 2013-02-04 – 2013-02-06 (×2): 5 mg via ORAL
  Filled 2013-02-04 (×3): qty 1

## 2013-02-04 MED ORDER — PENTAFLUOROPROP-TETRAFLUOROETH EX AERO: 1.0000 "application " | INHALATION_SPRAY | CUTANEOUS | Status: DC | PRN

## 2013-02-04 MED ORDER — ALTEPLASE 2 MG IJ SOLR
2.0000 mg | Freq: Once | INTRAMUSCULAR | Status: DC | PRN
  Filled 2013-02-04: qty 2

## 2013-02-04 MED ORDER — LIDOCAINE HCL (PF) 1 % IJ SOLN: 5.0000 mL | INTRAMUSCULAR | Status: DC | PRN

## 2013-02-04 MED ORDER — LIDOCAINE-PRILOCAINE 2.5-2.5 % EX CREA: 1.0000 "application " | TOPICAL_CREAM | CUTANEOUS | Status: DC | PRN

## 2013-02-04 MED ORDER — TEMAZEPAM 7.5 MG PO CAPS: 7.5000 mg | ORAL_CAPSULE | Freq: Every evening | ORAL | Status: DC | PRN

## 2013-02-04 MED ORDER — NEPRO/CARBSTEADY PO LIQD: 237.0000 mL | ORAL | Status: DC | PRN

## 2013-02-04 MED ORDER — DARBEPOETIN ALFA-POLYSORBATE 25 MCG/0.42ML IJ SOLN
INTRAMUSCULAR | Status: AC
  Administered 2013-02-04: 25 ug via INTRAVENOUS
  Filled 2013-02-04: qty 0.42

## 2013-02-04 MED ORDER — ACETAMINOPHEN 325 MG PO TABS
650.0000 mg | ORAL_TABLET | Freq: Four times a day (QID) | ORAL | Status: DC | PRN
  Administered 2013-02-04: 650 mg via ORAL
  Filled 2013-02-04: qty 2

## 2013-02-04 NOTE — Progress Notes (Signed)
[facility] PROGRESS NOTE  Adam Walsh NWG:[id number] DOB: [birth year] DOA: 02/02/2013 PCP: [name], MD  Assessment/Plan:  Myasthenia gravis with acute exacerbation  - Appreciate neurology consult and following  - Pt has been started on IVIG x 5 doses  - prednisone per neuro  - Speech Therapy Eval for swallowing assessment and evaluation recommended thin liquids and regular diet - Mestinon was not tolerated by patient   End stage renal disease on dialysis  - The nephrology team notified of admission and consulted   HIT (heparin-induced thrombocytopenia)  - avoid heparin based products at this time   Hypertension  - currently BPs are controlled, following    Celiac sprue Gluten-free diet  Diarrhea  - no recent diarrhea, monitor I/Os and fluids  HPI/Subjective: Patient is reporting that he has much less ptosis of the eyelid.  He does report feeling weak.  He had shortness of breath before dialysis but it resolved after HD treatment.  Objective: Filed Vitals:   02/04/13 1410  BP: 115/43  Pulse: 90  Temp: 98.7 F (37.1 C)  Resp: 18    Intake/Output Summary (Last 24 hours) at 02/04/13 1652 Last data filed at 02/04/13 1411  Gross per 24 hour  Intake    120 ml  Output    500 ml  Net   -380 ml   Filed Weights   02/02/13 1729 02/04/13 0742  Weight: 86.183 kg (190 lb) 84.8 kg (186 lb 15.2 oz)    Exam: General appearance: alert, cooperative and no distress  Head: Normocephalic, without obvious abnormality, atraumatic  Eyes: Much improved bilateral ptosis.  Nose: Nares normal. Septum midline. Mucosa normal. No drainage or sinus tenderness.  Neck: no JVD and supple, symmetrical, trachea midline  Lungs: clear to auscultation bilaterally  Heart: regular rate and rhythm, S1, S2 normal, no murmur, click, rub or gallop  Abdomen: soft, non-tender; bowel sounds normal; no masses, no organomegaly  Extremities: extremities normal, atraumatic, no cyanosis  or edema Pulses: 2+ and symmetric  Skin: Skin color, texture, turgor normal. No rashes or lesions  Neurologic: Less ptosis noted, 4/5 neck flexor strength; 5/5 neck extensors strength; 5-/5 strength of deltoid muscles bilaterally; extremity strength otherwise normal  Data Reviewed: Basic Metabolic Panel:  Recent Labs Lab 02/02/13 1805 02/03/13 0530 02/04/13 0800  NA 137 135 134*  K 4.3 3.9 5.0  CL 101 100 102  CO2 26 24 19   GLUCOSE 109* 91 97  BUN 29* 33* 49*  CREATININE 6.89* 7.78* 8.62*  CALCIUM 9.2 8.9 8.8  PHOS  --   --  5.3*   Liver Function Tests:  Recent Labs Lab 02/02/13 1805 02/04/13 0800  AST 13  --   ALT 12  --   ALKPHOS 66  --   BILITOT 0.4  --   PROT 6.3  --   ALBUMIN 3.3* 3.2*   No results found for this basename: LIPASE, AMYLASE,  in the last 168 hours No results found for this basename: AMMONIA,  in the last 168 hours CBC:  Recent Labs Lab 02/02/13 1805 02/03/13 0530 02/04/13 0800  WBC 8.1 6.1 6.0  NEUTROABS 6.1  --   --   HGB 10.8* 9.9* 11.0*  HCT 31.2* 28.6* 31.6*  MCV 97.5 97.9 96.6  PLT 173 163 171   Cardiac Enzymes: No results found for this basename: CKTOTAL, CKMB, CKMBINDEX, TROPONINI,  in the last 168 hours BNP (last 3 results) No results found for this basename: PROBNP,  in the last 8760  hours CBG: No results found for this basename: GLUCAP,  in the last 168 hours  No results found for this or any previous visit (from the past 240 hour(s)).   Studies: Dg Chest 2 View  02/02/2013   *RADIOLOGY REPORT*  Clinical Data: Chest pain.  CHEST - 2 VIEW  Comparison: Chest x-ray 09/24/2012.  Findings: Minimal scarring in the periphery of the left lung base, unchanged.  No acute consolidative airspace disease.  No pleural effusions.  No evidence of pulmonary edema.  Heart size is normal. Mediastinal contours are unremarkable.  Numerous surgical clips near the gastroesophageal junction.  Surgical clips are also noted over the right upper  quadrant the abdomen, compatible with prior cholecystectomy.  IMPRESSION: 1.  No radiographic evidence of acute cardiopulmonary disease.   Original Report Authenticated By: Trudie Reed, M.D.    Scheduled Meds: . aspirin EC  81 mg Oral Daily  . darbepoetin (ARANESP) injection - DIALYSIS  25 mcg Intravenous Q Mon-HD  . doxercalciferol  1 mcg Intravenous Q M,W,F-HD  . IMMUNE GLOBULIN 10% (HUMAN) IV - For Fluid Restriction Only  30 g Intravenous Daily  . sodium chloride  3 mL Intravenous Q12H   Continuous Infusions:   Principal Problem:   Myasthenia gravis with acute exacerbation Active Problems:   End stage renal disease on dialysis   HIT (heparin-induced thrombocytopenia)   Hypertension   [employer] [phone]. If 7PM-7AM, please contact night-coverage at [email or web address], password [email or web address] 02/04/2013, 4:52 PM  LOS: 2 days

## 2013-02-04 NOTE — Progress Notes (Signed)
NEURO HOSPITALIST PROGRESS NOTE   SUBJECTIVE:                                                                                                                         Patient feeling much improved with both voice and extraocular strength. At present time hooked to HD and feeling slightly short of breath.   OBJECTIVE:                                                                                                                           Vital signs in last 24 hours: Temp:  [97.2 F (36.2 C)-98.6 F (37 C)] 98 F (36.7 C) (09/08 1213) Pulse Rate:  [60-93] 84 (09/08 1213) Resp:  [17-18] 18 (09/08 1213) BP: (97-151)/(52-83) 98/74 mmHg (09/08 1213) SpO2:  [97 %-100 %] 100 % (09/08 1213) Weight:  [84.8 kg (186 lb 15.2 oz)] 84.8 kg (186 lb 15.2 oz) (09/08 0742)  Intake/Output from previous day: 09/07 0701 - 09/08 0700 In: 100 [P.O.:100] Out: 825 [Urine:825] Intake/Output this shift:   Nutritional status: Gluten Restricted  Past Medical History  Diagnosis Date  . Renal insufficiency   . Hemodialysis patient     Monday, Wednesday and Friday  . HIT (heparin-induced thrombocytopenia)   . Myasthenia gravis   . Hypertension   . History of pyelonephritis   . History of nephrolithiasis   . Celiac sprue   . Hearing deficit     left hearing aid  . Neuropathy     Peripheral neuropathy  . Pancreatic cyst      Neurologic Exam:  Mental Status: Alert, oriented, thought content appropriate.  Speech slightly dysarthric without evidence of aphasia.  Able to follow 3 step commands without difficulty. Cranial Nerves: II: Discs flat bilaterally; Visual fields grossly normal, pupils equal, round, reactive to light and accommodation III,IV, VI: ptosis not present, extra-ocular motions intact bilaterally V,VII: smile symmetric, facial light touch sensation normal bilaterally VIII: hearing normal bilaterally IX,X: gag reflex present XI: bilateral  shoulder shrug XII: midline tongue extension Motor: Right : Upper extremity   5/5    Left:     Upper extremity   5/5  Lower extremity   5/5     Lower extremity  5/5 Tone and bulk:normal tone throughout; no atrophy noted Sensory: Pinprick and light touch intact throughout, bilaterally Deep Tendon Reflexes:  1+ throughout Plantars: Right: downgoing   Left: mute bilatearlly Cerebellar: normal finger-to-nose,  normal heel-to-shin test CV: pulses palpable throughout    Lab Results: No results found for this basename: cbc, bmp, coags, chol, tri, ldl, hga1c   Lipid Panel No results found for this basename: CHOL, TRIG, HDL, CHOLHDL, VLDL, LDLCALC,  in the last 72 hours  Studies/Results: Dg Chest 2 View  02/02/2013   *RADIOLOGY REPORT*  Clinical Data: Chest pain.  CHEST - 2 VIEW  Comparison: Chest x-ray 09/24/2012.  Findings: Minimal scarring in the periphery of the left lung base, unchanged.  No acute consolidative airspace disease.  No pleural effusions.  No evidence of pulmonary edema.  Heart size is normal. Mediastinal contours are unremarkable.  Numerous surgical clips near the gastroesophageal junction.  Surgical clips are also noted over the right upper quadrant the abdomen, compatible with prior cholecystectomy.  IMPRESSION: 1.  No radiographic evidence of acute cardiopulmonary disease.   Original Report Authenticated By: Trudie Reed, M.D.    MEDICATIONS                                                                                                                        Scheduled: . aspirin EC  81 mg Oral Daily  . darbepoetin (ARANESP) injection - DIALYSIS  25 mcg Intravenous Q Mon-HD  . doxercalciferol  1 mcg Intravenous Q M,W,F-HD  . IMMUNE GLOBULIN 10% (HUMAN) IV - For Fluid Restriction Only  30 g Intravenous Daily  . sodium chloride  3 mL Intravenous Q12H    ASSESSMENT/PLAN:                                                                                                             72 year old man with myasthenia gravis, as well as chronic kidney disease on hemodialysis, presenting with progressive weakness with bulbar involvement primarily. Patient has received 2/5 treatments and feeling improved.   Recommendations:  1. IVIG 0.4 mg per kilogram per day for total of 5 treatments  2. Continue prednisone at 10 mg per day  3. Speech therapy consultation for evaluation and recommendations regarding speech and swallowing 4. Repeat NIF and VC   Assessment and plan discussed with with attending physician and they are in agreement.    Felicie Morn PA-C [facility] [phone]  02/04/2013, 12:24 PM

## 2013-02-04 NOTE — Progress Notes (Signed)
[facility] Progress Note  Subjective:   C/o sob and some anxiety. 02 Sats 100% on 2L via .   Objective Filed Vitals:   02/04/13 0830 02/04/13 0900 02/04/13 0930 02/04/13 1000  BP: 130/71 145/58 111/63 138/52  Pulse: 66 82 77 78  Temp:      TempSrc:      Resp:      Height:      Weight:      SpO2:  100%  100%   Physical Exam General: Elderly, chronically ill-appearing , NAD Heart: RRR Lungs: CTA bilat, no wheezes, rales or rhonchi Abdomen: Soft, NT, normal BS Extremities: No LE edema Dialysis Access: L AVF patent  Outpatient HD: Nx[facility] Home Hemo, MWFS No heparin  Assessment/Plan: 1. Myasthenia gravis - exacerbation over last 2 wks, improved with Mestinon, but stopped 9/2 sec to GI symptoms; remains on Prednisone, getting IVIG once daily x 5 per Neurology (to be given during last hr of HD or post-HD). 2. SOB - ? Fatigue secondary to #1 above. Sats 100 %, lungs clear, no vol excess 3. ESRD - Home dialysis using NxStage on MWFS, last Tx on 9/5; K 5.0 HD today, no heparin 4. Hypertension/volume - SBPs 110's -130s, stable without meds;  chest x-ray negative, no fluid removal with HD 5. Anemia - Hgb 11 > 9.9, outpatient Epogen 2000 U qwk, last T-sat 44% on Venofer 200 mg q 4wks. Aranesp 25 mcg tomorrow. 6. Metabolic bone disease - Ca 8.8 (9.4 corrected) , P 5.3  Last iPTH 117; On op Zemplar 2 mcg 3x/wk, Phoslo 3 with meals. Hectorol 1 mcg per HD while here.  7. Nutrition - Last Alb 3.2, gluten-free diet, renal vitamin. 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  Scot Jun. Broadus John, PA-C [facility] Pager [phone] 02/04/2013,10:25 AM  LOS: 2 days   Patient seen and examined.  Agree with assessment and plan as above. Vinson Moselle  MD Pager [phone]    Cell  [phone] 02/04/2013, 5:12 PM    Additional Objective Labs: Basic Metabolic Panel:  Recent Labs Lab 02/02/13 1805 02/03/13 0530 02/04/13 0800   NA 137 135 134*  K 4.3 3.9 5.0  CL 101 100 102  CO2 26 24 19   GLUCOSE 109* 91 97  BUN 29* 33* 49*  CREATININE 6.89* 7.78* 8.62*  CALCIUM 9.2 8.9 8.8  PHOS  --   --  5.3*   Liver Function Tests:  Recent Labs Lab 02/02/13 1805 02/04/13 0800  AST 13  --   ALT 12  --   ALKPHOS 66  --   BILITOT 0.4  --   PROT 6.3  --   ALBUMIN 3.3* 3.2*   CBC:  Recent Labs Lab 02/02/13 1805 02/03/13 0530 02/04/13 0800  WBC 8.1 6.1 6.0  NEUTROABS 6.1  --   --   HGB 10.8* 9.9* 11.0*  HCT 31.2* 28.6* 31.6*  MCV 97.5 97.9 96.6  PLT 173 163 171   Blood Culture    Component Value Date/Time   SDES BLOOD ARM LEFT 07/01/2010 1626   SPECREQUEST BOTTLES DRAWN AEROBIC AND ANAEROBIC 5CC EACH 07/01/2010 1626   CULT NO GROWTH 5 DAYS 07/01/2010 1626   REPTSTATUS 07/07/2010 FINAL 07/01/2010 1626    Studies/Results: Dg Chest 2 View  02/02/2013   *RADIOLOGY REPORT*  Clinical Data: Chest pain.  CHEST - 2 VIEW  Comparison: Chest x-ray 09/24/2012.  Findings: Minimal scarring in the periphery  of the left lung base, unchanged.  No acute consolidative airspace disease.  No pleural effusions.  No evidence of pulmonary edema.  Heart size is normal. Mediastinal contours are unremarkable.  Numerous surgical clips near the gastroesophageal junction.  Surgical clips are also noted over the right upper quadrant the abdomen, compatible with prior cholecystectomy.  IMPRESSION: 1.  No radiographic evidence of acute cardiopulmonary disease.   Original Report Authenticated By: Trudie Reed, M.D.   Medications:   . aspirin EC  81 mg Oral Daily  . darbepoetin (ARANESP) injection - DIALYSIS  25 mcg Intravenous Q Mon-HD  . doxercalciferol  1 mcg Intravenous Q M,W,F-HD  . IMMUNE GLOBULIN 10% (HUMAN) IV - For Fluid Restriction Only  30 g Intravenous Daily  . sodium chloride  3 mL Intravenous Q12H

## 2013-02-04 NOTE — Progress Notes (Signed)
RT note: VC 1.2 L and NIF -26.

## 2013-02-05 NOTE — Progress Notes (Signed)
Speech Language Pathology Dysphagia Treatment Patient Details Name: Adam Walsh MRN: [id number] DOB: 1940/12/07 Today's Date: 02/05/2013 Time: 8295-6213 SLP Time Calculation (min): 15 min  Assessment / Plan / Recommendation Clinical Impression  Treatment focus on skilled observation for diet tolerance of regular textures with thin liquids. Pt without overt s/s of aspiration with breakfast meal of regular textures and thin liquids and was Mod I for utilization of swallowing compensatory strategies, therefore, recommend D/C from skilled SLP services. Pt verbalized understanding and agreement.     Diet Recommendation  Continue with Current Diet: Regular;Thin liquid    SLP Plan Discharge SLP treatment due to (comment)   Pertinent Vitals/Pain No/Denies Pain    Swallowing Goals  SLP Swallowing Goals Swallow Study Goal #2 - Progress: Met  General Temperature Spikes Noted: No Respiratory Status: Room air Behavior/Cognition: Alert;Cooperative;Pleasant mood Oral Cavity - Dentition: Dentures, top;Dentures, bottom Patient Positioning: Upright in bed  Oral Cavity - Oral Hygiene     Dysphagia Treatment Treatment focused on: Skilled observation of diet tolerance;Utilization of compensatory strategies Treatment Methods/Modalities: Skilled observation Feeding: Able to feed self Liquids provided via: Cup Amount of cueing:  (Mod I)   GO     Adam Walsh 02/05/2013, 10:18 AM  Feliberto Gottron, MA, CCC-SLP [phone]

## 2013-02-05 NOTE — Progress Notes (Signed)
RT note: VC 1.35 L and NIF -40. Patient on RA. HR 67, RR 20, and sats 97%. Clear.

## 2013-02-05 NOTE — Progress Notes (Signed)
Subjective:  Improving very well, vision and speech better, no dyspnea.  Objective: Vital signs in last 24 hours: Temp:  [97.2 F (36.2 C)-99.1 F (37.3 C)] 98.6 F (37 C) (09/09 0600) Pulse Rate:  [65-93] 83 (09/09 0600) Resp:  [18] 18 (09/09 0600) BP: (97-151)/(43-83) 124/64 mmHg (09/09 0600) SpO2:  [96 %-100 %] 96 % (09/09 0600) Weight:  [84.8 kg (186 lb 15.2 oz)] 84.8 kg (186 lb 15.2 oz) (09/08 0742) Weight change:   Intake/Output from previous day: 09/08 0701 - 09/09 0700 In: 240 [P.O.:240] Out: 550 [Urine:350]   Lab Results:  Recent Labs  02/03/13 0530 02/04/13 0800  WBC 6.1 6.0  HGB 9.9* 11.0*  HCT 28.6* 31.6*  PLT 163 171   BMET:  Recent Labs  02/02/13 1805 02/03/13 0530 02/04/13 0800  NA 137 135 134*  K 4.3 3.9 5.0  CL 101 100 102  CO2 26 24 19   GLUCOSE 109* 91 97  BUN 29* 33* 49*  CREATININE 6.89* 7.78* 8.62*  CALCIUM 9.2 8.9 8.8  ALBUMIN 3.3*  --  3.2*   No results found for this basename: PTH,  in the last 72 hours Iron Studies: No results found for this basename: IRON, TIBC, TRANSFERRIN, FERRITIN,  in the last 72 hours  EXAM: General appearance:  Alert, in no apparent distress Resp:  CTA without rales, rhonchi, or wheezes Cardio:  RRR without murmur or rub crackles at bases bilat GI:  + BS, soft and nontender Extremities:  No edema Access:  AVF @ LUA with + bruit  Dialysis Orders: Home HD using NxStage with PureFlow on MWFS  BFR 400 EDW 90 kg AVF @ LUA Epogen 2000 U on Fri Venofer 200 mg every 4 wks Zemplar 2 mcg 3x/wk  Assessment/Plan: 1. Myasthenia gravis - exacerbation over last 2 wks, improved with Mestinon, but stopped 9/2 sec to GI symptoms; remains on Prednisone, starting IVIG per day x 5 (s/p Day 3) per Neurology (to be given during last hr of HD or post-HD).  2. Dyspnea - resolved, likely sec to #1, no volume excess. 3. ESRD - Home dialysis using NxStage on MWFS; MWF in hospital; last K 5. HD tomorrow.  4. Hypertension/volume - BP  124/64, stable without meds; current wt 84.8 kg, chest x-ray negative, non-oliguric.  No fluid removal with HD 5kg under dry wt 5. Anemia - Hgb 11, outpatient Epogen 2000 U qwk, last T-sat 44% on Venofer 200 mg q4wks; s/p Aranesp 25 mcg on Mon.  6. Metabolic bone disease - Ca 8.8, P 5.3, last iPTH 117; outpatient Zemplar 2 mcg 3x/wk, Phoslo 3 with meals; Hectorol 1 mcg per HD in hospital.  7. Nutrition - Alb 3.2, gluten-free diet, renal vitamin.  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    LOS: 3 days   LYLES,CHARLES 02/05/2013,7:36 AM  Patient seen and examined.  I agree with assessment and plan as above with additions as indicated. Vinson Moselle  MD Pager [phone]    Cell  [phone] 02/05/2013, 10:37 AM

## 2013-02-05 NOTE — Progress Notes (Signed)
[facility] PROGRESS NOTE  Adam Walsh UJW:[id number] DOB: 1941-05-16 DOA: 02/02/2013 PCP: [name], MD  Assessment/Plan: Myasthenia gravis with acute exacerbation  - Appreciate neurology consult and following  - Pt has been started on IVIG x 5 doses total - prednisone per neuro likely to start 9/10 - Speech Therapy Eval for swallowing assessment and evaluation recommended thin liquids and regular diet  - Mestinon was not tolerated by patient outpatient   End stage renal disease on dialysis  - The nephrology team notified of admission and consulted   HIT (heparin-induced thrombocytopenia)  - avoid heparin based products at this time   Hypertension  - currently BPs are controlled, following   Celiac sprue  Gluten-free diet   Diarrhea  - no recent diarrhea, monitor I/Os and fluids   HPI/Subjective: Pt reports that he is not able to rest well in hospital. No SOB or vision problems today.  Objective: Filed Vitals:   02/05/13 1423  BP: 128/63  Pulse: 63  Temp: 97.6 F (36.4 C)  Resp: 18    Intake/Output Summary (Last 24 hours) at 02/05/13 1424 Last data filed at 02/05/13 1423  Gross per 24 hour  Intake    800 ml  Output    750 ml  Net     50 ml   Filed Weights   02/02/13 1729 02/04/13 0742  Weight: 86.183 kg (190 lb) 84.8 kg (186 lb 15.2 oz)    Exam: General appearance: alert, cooperative and no distress  Head: Normocephalic, without obvious abnormality, atraumatic  Eyes: Much improved bilateral ptosis.  Nose: Nares normal. Septum midline. Mucosa normal. No drainage or sinus tenderness.  Neck: no JVD and supple, symmetrical, trachea midline  Lungs: clear to auscultation bilaterally  Heart: regular rate and rhythm, S1, S2 normal, no murmur, click, rub or gallop  Abdomen: soft, non-tender; bowel sounds normal; no masses, no organomegaly  Extremities: extremities normal, atraumatic, no cyanosis or edema Pulses: 2+ and symmetric  Skin: Skin  color, texture, turgor normal. No rashes or lesions  Neurologic: Less ptosis noted, 4/5 neck flexor strength; 5/5 neck extensors strength; 5-/5 strength of deltoid muscles bilaterally; extremity strength otherwise normal  Data Reviewed: Basic Metabolic Panel:  Recent Labs Lab 02/02/13 1805 02/03/13 0530 02/04/13 0800  NA 137 135 134*  K 4.3 3.9 5.0  CL 101 100 102  CO2 26 24 19   GLUCOSE 109* 91 97  BUN 29* 33* 49*  CREATININE 6.89* 7.78* 8.62*  CALCIUM 9.2 8.9 8.8  PHOS  --   --  5.3*   Liver Function Tests:  Recent Labs Lab 02/02/13 1805 02/04/13 0800  AST 13  --   ALT 12  --   ALKPHOS 66  --   BILITOT 0.4  --   PROT 6.3  --   ALBUMIN 3.3* 3.2*   No results found for this basename: LIPASE, AMYLASE,  in the last 168 hours No results found for this basename: AMMONIA,  in the last 168 hours CBC:  Recent Labs Lab 02/02/13 1805 02/03/13 0530 02/04/13 0800  WBC 8.1 6.1 6.0  NEUTROABS 6.1  --   --   HGB 10.8* 9.9* 11.0*  HCT 31.2* 28.6* 31.6*  MCV 97.5 97.9 96.6  PLT 173 163 171   Cardiac Enzymes: No results found for this basename: CKTOTAL, CKMB, CKMBINDEX, TROPONINI,  in the last 168 hours BNP (last 3 results) No results found for this basename: PROBNP,  in the last 8760 hours CBG: No results found for  this basename: GLUCAP,  in the last 168 hours  No results found for this or any previous visit (from the past 240 hour(s)).   Studies: No results found.  Scheduled Meds: . aspirin EC  81 mg Oral Daily  . darbepoetin (ARANESP) injection - DIALYSIS  25 mcg Intravenous Q Mon-HD  . doxercalciferol  1 mcg Intravenous Q M,W,F-HD  . IMMUNE GLOBULIN 10% (HUMAN) IV - For Fluid Restriction Only  30 g Intravenous Daily  . sodium chloride  3 mL Intravenous Q12H   Continuous Infusions:   Principal Problem:   Myasthenia gravis with acute exacerbation Active Problems:   End stage renal disease on dialysis   HIT (heparin-induced thrombocytopenia)    Hypertension   Adam Walsh [employer] [phone]. If 7PM-7AM, please contact night-coverage at [email or web address], password [facility] 02/05/2013, 2:24 PM  LOS: 3 days

## 2013-02-05 NOTE — Progress Notes (Signed)
Subjective: No significant changes.   Exam: Filed Vitals:   02/05/13 0952  BP: 123/63  Pulse: 72  Temp: 97.9 F (36.6 C)  Resp: 17   Gen: In bed, NAD MS: Awake, alert, oriented ZO:XWRUE, mildly dysconjugate gaze has improved significnaty, dysarthria.  Motor: has good strength peripherally.  Sensory:intact to LT  UA - no infection  Impression: 72 yo M with MG exacerbation. Given his degree of bulbar involvement, he has been admitted for IVIg. He continues to have mild SOB, but this has improved overnight. NIFs have been borderline, will need to continue to monitor.   Recommendations: 1) Continue IVIG dose #4/5 today 2) Q12H NIF and VC 3) will start prednisone, liekly tomorrow.    Ritta Slot, MD [facility] [phone]  If 7pm- 7am, please page neurology on call at [phone].

## 2013-02-06 DIAGNOSIS — Z992 Dependence on renal dialysis: Secondary | ICD-10-CM

## 2013-02-06 LAB — RENAL FUNCTION PANEL
Albumin: 2.8 g/dL — ABNORMAL LOW (ref 3.5–5.2)
CO2: 22 mEq/L (ref 19–32)
Calcium: 8.6 mg/dL (ref 8.4–10.5)
Creatinine, Ser: 6.45 mg/dL — ABNORMAL HIGH (ref 0.50–1.35)
GFR calc Af Amer: 9 mL/min — ABNORMAL LOW (ref >90)
GFR calc non Af Amer: 8 mL/min — ABNORMAL LOW (ref >90)
Potassium: 4 mEq/L (ref 3.5–5.1)
Sodium: 136 mEq/L (ref 135–145)

## 2013-02-06 LAB — CBC
MCH: 33.7 pg (ref 26.0–34.0)
Platelets: 134 10*3/uL — ABNORMAL LOW (ref 150–400)
RBC: 3.09 MIL/uL — ABNORMAL LOW (ref 4.22–5.81)
WBC: 4.3 10*3/uL (ref 4.0–10.5)

## 2013-02-06 MED ORDER — LIDOCAINE HCL (PF) 1 % IJ SOLN: 5.0000 mL | INTRAMUSCULAR | Status: DC | PRN

## 2013-02-06 MED ORDER — LIDOCAINE-PRILOCAINE 2.5-2.5 % EX CREA: 1.0000 "application " | TOPICAL_CREAM | CUTANEOUS | Status: DC | PRN

## 2013-02-06 MED ORDER — ALTEPLASE 2 MG IJ SOLR: 2.0000 mg | Freq: Once | INTRAMUSCULAR | Status: DC | PRN

## 2013-02-06 MED ORDER — SODIUM CHLORIDE 0.9 % IV SOLN: 100.0000 mL | INTRAVENOUS | Status: DC | PRN

## 2013-02-06 MED ORDER — IMMUNE GLOBULIN (HUMAN) 10 GM/100ML IV SOLN
30.0000 g | Freq: Once | INTRAVENOUS | Status: AC
  Administered 2013-02-06: 30 g via INTRAVENOUS

## 2013-02-06 MED ORDER — DOXERCALCIFEROL 4 MCG/2ML IV SOLN
INTRAVENOUS | Status: AC
  Filled 2013-02-06: qty 2

## 2013-02-06 MED ORDER — PREDNISONE 20 MG PO TABS
20.0000 mg | ORAL_TABLET | Freq: Every day | ORAL | Status: DC
  Administered 2013-02-07: 20 mg via ORAL
  Filled 2013-02-06 (×3): qty 1

## 2013-02-06 MED ORDER — PENTAFLUOROPROP-TETRAFLUOROETH EX AERO: 1.0000 "application " | INHALATION_SPRAY | CUTANEOUS | Status: DC | PRN

## 2013-02-06 MED ORDER — HEPARIN SODIUM (PORCINE) 1000 UNIT/ML DIALYSIS: 1000.0000 [IU] | INTRAMUSCULAR | Status: DC | PRN

## 2013-02-06 MED ORDER — NEPRO/CARBSTEADY PO LIQD
237.0000 mL | ORAL | Status: DC | PRN
  Filled 2013-02-06: qty 237

## 2013-02-06 NOTE — Procedures (Signed)
I was present at this dialysis session. I have reviewed the session itself and made appropriate changes.   Adam Moselle  MD Pager [phone]    Cell  [phone] 02/06/2013, 10:25 AM

## 2013-02-06 NOTE — Progress Notes (Signed)
[facility] PROGRESS NOTE  Adam Walsh RUE:[id number] DOB: 01-17-1941 DOA: 02/02/2013 PCP: [name], MD  Assessment/Plan: Myasthenia gravis with acute exacerbation  - Pt has been started on IVIG, with 5/5 dose to be given today.  - prednisone per neuro likely to start 9/10 - Speech Therapy Eval for swallowing assessment and evaluation recommended thin liquids and regular diet   End stage renal disease on dialysis  - The nephrology team notified of admission and consulted   HIT (heparin-induced thrombocytopenia)  - avoid heparin based products at this time   Hypertension  - currently BPs are controlled, following   Celiac sprue  Gluten-free diet   Diarrhea  - no recent diarrhea, monitor I/Os and fluids   HPI/Subjective: Patient reports feeling "wiped out" after HD today. Otherwise reports tolerating po intake, afebrile, stable.   Objective: Filed Vitals:   02/06/13 1320  BP: 113/63  Pulse: 87  Temp: 98.5 F (36.9 C)  Resp: 20    Intake/Output Summary (Last 24 hours) at 02/06/13 1501 Last data filed at 02/06/13 1300  Gross per 24 hour  Intake    360 ml  Output   2047 ml  Net  -1687 ml   Filed Weights   02/04/13 0742 02/06/13 0726 02/06/13 1157  Weight: 84.8 kg (186 lb 15.2 oz) 85.1 kg (187 lb 9.8 oz) 82.5 kg (181 lb 14.1 oz)    Exam: General appearance: alert, cooperative and no distress  Head: Normocephalic, without obvious abnormality, atraumatic  Eyes: Much improved bilateral ptosis.  Nose: Nares normal. Septum midline. Mucosa normal. No drainage or sinus tenderness.  Neck: no JVD and supple, symmetrical, trachea midline  Lungs: clear to auscultation bilaterally  Heart: regular rate and rhythm, S1, S2 normal, no murmur, click, rub or gallop  Abdomen: soft, non-tender; bowel sounds normal; no masses, no organomegaly  Extremities: extremities normal, atraumatic, no cyanosis or edema Pulses: 2+ and symmetric  Skin: Skin color, texture,  turgor normal. No rashes or lesions  Neurologic: Less ptosis noted, 4/5 neck flexor strength; 5/5 neck extensors strength; 5-/5 strength of deltoid muscles bilaterally; extremity strength otherwise normal  Data Reviewed: Basic Metabolic Panel:  Recent Labs Lab 02/02/13 1805 02/03/13 0530 02/04/13 0800 02/06/13 0800  NA 137 135 134* 136  K 4.3 3.9 5.0 4.0  CL 101 100 102 104  CO2 26 24 19 22   GLUCOSE 109* 91 97 154*  BUN 29* 33* 49* 30*  CREATININE 6.89* 7.78* 8.62* 6.45*  CALCIUM 9.2 8.9 8.8 8.6  PHOS  --   --  5.3* 3.7   Liver Function Tests:  Recent Labs Lab 02/02/13 1805 02/04/13 0800 02/06/13 0800  AST 13  --   --   ALT 12  --   --   ALKPHOS 66  --   --   BILITOT 0.4  --   --   PROT 6.3  --   --   ALBUMIN 3.3* 3.2* 2.8*   No results found for this basename: LIPASE, AMYLASE,  in the last 168 hours No results found for this basename: AMMONIA,  in the last 168 hours CBC:  Recent Labs Lab 02/02/13 1805 02/03/13 0530 02/04/13 0800 02/06/13 0800  WBC 8.1 6.1 6.0 4.3  NEUTROABS 6.1  --   --   --   HGB 10.8* 9.9* 11.0* 10.4*  HCT 31.2* 28.6* 31.6* 29.9*  MCV 97.5 97.9 96.6 96.8  PLT 173 163 171 134*   Cardiac Enzymes: No results found for this basename: CKTOTAL,  CKMB, CKMBINDEX, TROPONINI,  in the last 168 hours BNP (last 3 results) No results found for this basename: PROBNP,  in the last 8760 hours CBG: No results found for this basename: GLUCAP,  in the last 168 hours  No results found for this or any previous visit (from the past 240 hour(s)).   Studies: No results found.  Scheduled Meds: . aspirin EC  81 mg Oral Daily  . darbepoetin (ARANESP) injection - DIALYSIS  25 mcg Intravenous Q Mon-HD  . doxercalciferol      . doxercalciferol  1 mcg Intravenous Q M,W,F-HD  . IMMUNE GLOBULIN 10% (HUMAN) IV - For Fluid Restriction Only  30 g Intravenous Daily  . sodium chloride  3 mL Intravenous Q12H   Continuous Infusions:   Principal Problem:    Myasthenia gravis with acute exacerbation Active Problems:   End stage renal disease on dialysis   HIT (heparin-induced thrombocytopenia)   Hypertension   Adam Walsh  [facility] Pager [phone]. If 7PM-7AM, please contact night-coverage at [email or web address], password [facility] 02/06/2013, 3:01 PM  LOS: 4 days

## 2013-02-06 NOTE — Progress Notes (Signed)
NEURO HOSPITALIST PROGRESS NOTE   SUBJECTIVE:                                                                                                                        Feeling improved since yesterday.  He no longer is having trouble swallowing and is not short of breath. Continues to have right eye ptosis but wife states this has improved significantly.   OBJECTIVE:                                                                                                                           Vital signs in last 24 hours: Temp:  [96.2 F (35.7 C)-98.8 F (37.1 C)] 98.5 F (36.9 C) (09/10 1320) Pulse Rate:  [61-87] 87 (09/10 1320) Resp:  [16-22] 20 (09/10 1320) BP: (113-163)/(57-78) 113/63 mmHg (09/10 1320) SpO2:  [94 %-100 %] 99 % (09/10 1320) Weight:  [82.5 kg (181 lb 14.1 oz)-85.1 kg (187 lb 9.8 oz)] 82.5 kg (181 lb 14.1 oz) (09/10 1157)  Intake/Output from previous day: 09/09 0701 - 09/10 0700 In: 800 [P.O.:800] Out: 600 [Urine:600] Intake/Output this shift: Total I/O In: 240 [P.O.:240] Out: 1847 [Other:1847] Nutritional status: Gluten Restricted  Past Medical History  Diagnosis Date  . Renal insufficiency   . Hemodialysis patient     Monday, Wednesday and Friday  . HIT (heparin-induced thrombocytopenia)   . Myasthenia gravis   . Hypertension   . History of pyelonephritis   . History of nephrolithiasis   . Celiac sprue   . Hearing deficit     left hearing aid  . Neuropathy     Peripheral neuropathy  . Pancreatic cyst      Neurologic Exam:   Mental Status: Alert, oriented, thought content appropriate.  Speech fluent without evidence of aphasia.  Able to follow 3 step commands without difficulty. Able to count to 35 in single breath.  Cranial Nerves: II: Discs flat bilaterally; Visual fields grossly normal, pupils equal, round, reactive to light and accommodation III,IV, VI: ptosis present right eye, extra-ocular motions intact  bilaterally V,VII: smile symmetric, facial light touch sensation normal bilaterally VIII: hearing normal bilaterally IX,X: gag reflex present XI: bilateral shoulder shrug XII: midline tongue extension Motor: Right : Upper extremity  5/5    Left:     Upper extremity   5/5  Lower extremity   5/5     Lower extremity   5/5 Tone and bulk:normal tone throughout; no atrophy noted Sensory: Pinprick and light touch intact throughout, bilaterally  Plantars: Right: downgoing   Left: downgoing Cerebellar: normal finger-to-nose,  normal heel-to-shin test CV: pulses palpable throughout   Lab Results: No results found for this basename: cbc, bmp, coags, chol, tri, ldl, hga1c   Lipid Panel No results found for this basename: CHOL, TRIG, HDL, CHOLHDL, VLDL, LDLCALC,  in the last 72 hours  Studies/Results: No results found.  MEDICATIONS                                                                                                                        Scheduled: . aspirin EC  81 mg Oral Daily  . darbepoetin (ARANESP) injection - DIALYSIS  25 mcg Intravenous Q Mon-HD  . doxercalciferol      . doxercalciferol  1 mcg Intravenous Q M,W,F-HD  . IMMUNE GLOBULIN 10% (HUMAN) IV - For Fluid Restriction Only  30 g Intravenous Daily  . sodium chloride  3 mL Intravenous Q12H    ASSESSMENT/PLAN:                                                                                                            Impression: 72 yo M with MG exacerbation. Given his degree of bulbar involvement, he has been admitted for IVIg. He continues to show improvement overnight. Last NIF -40 with VC 1.35, will need to continue to monitor.  Recommendations:  1) Continue IVIG dose #5/5 today  2) Q12H NIF and VC  3) will start prednisone 20 mg daily tomorrow and patient will remain on 20 mg daily until he sees Dr. [name].    Assessment and plan discussed with with attending physician and they are in agreement.    Felicie Morn PA-C [facility] [phone]  02/06/2013, 4:11 PM

## 2013-02-06 NOTE — Progress Notes (Signed)
Subjective:  Feeling much better, no current complaints  Objective: Vital signs in last 24 hours: Temp:  [97.6 F (36.4 C)-98.8 F (37.1 C)] 97.7 F (36.5 C) (09/10 0500) Pulse Rate:  [63-75] 75 (09/10 0500) Resp:  [17-22] 20 (09/10 0500) BP: (123-163)/(63-78) 146/65 mmHg (09/10 0500) SpO2:  [94 %-100 %] 94 % (09/10 0500) Weight change:   Intake/Output from previous day: 09/09 0701 - 09/10 0700 In: 800 [P.O.:800] Out: 600 [Urine:600]   Lab Results:  Recent Labs  02/04/13 0800  WBC 6.0  HGB 11.0*  HCT 31.6*  PLT 171   BMET:  Recent Labs  02/04/13 0800  NA 134*  K 5.0  CL 102  CO2 19  GLUCOSE 97  BUN 49*  CREATININE 8.62*  CALCIUM 8.8  ALBUMIN 3.2*   No results found for this basename: PTH,  in the last 72 hours Iron Studies: No results found for this basename: IRON, TIBC, TRANSFERRIN, FERRITIN,  in the last 72 hours  EXAM:  Gen: alert, in no apparent distress  Resp: Crackles at bases bilaterally; otherwise, clear Cardio: RRR without murmur or rub  GI: + BS, soft and nontender  Extremities: No edema  Access: AVF @ LUA with + bruit   Dialysis Orders: Home HD using NxStage with PureFlow on MWFS  BFR 400 EDW 90 kg   AVF @ LUA   Epogen 2000 U on Fri    Venofer 200 mg every 4 wks    Zemplar 2 mcg 3x/wk  Assessment/Plan: 1. Myasthenia gravis - exacerbation over last 2 wks, improved with Mestinon, but stopped 9/2 sec to GI symptoms; remains on Prednisone, started IVIG per day x 5 (s/p Day 4) per Neurology (to be given during last hr of HD or post-HD).  2. Dyspnea - resolved, likely sec to #1, no volume excess below EDW but BP up with rales, UF 2-3kg as tol today 3. ESRD - Home dialysis using NxStage on MWFS; MWF in hospital; last K 5. HD pending.  4. Hypertension/volume - BP 146/65, stable without meds; last wt 84.8 kg (5 L under EDW), chest x-ray negative, non-oliguric. No fluid removal with HD. 5. Anemia - Hgb 11, outpatient Epogen 2000 U qwk, last T-sat 44% on  Venofer 200 mg q4wks; s/p Aranesp 25 mcg on Mon.  6. Metabolic bone disease - Ca 8.8, P 5.3, last iPTH 117; outpatient Zemplar 2 mcg 3x/wk, Phoslo 3 with meals; Hectorol 1 mcg per HD in hospital.  7. Nutrition - Alb 3.2, gluten-free diet, renal vitamin.  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   LOS: 4 days   LYLES,CHARLES 02/06/2013,7:05 AM  Patient seen and examined.  I agree with assessment and plan as above with additions as indicated. Vinson Moselle  MD Pager [phone]    Cell  [phone] 02/06/2013, 10:24 AM

## 2013-02-07 MED ORDER — PREDNISONE 20 MG PO TABS: 20.0000 mg | ORAL_TABLET | Freq: Every day | ORAL | Status: DC

## 2013-02-07 NOTE — Discharge Summary (Signed)
Physician Discharge Summary  Adam Walsh:[id number] DOB: 1940/10/15 DOA: 02/02/2013  PCP: [name], MD  Admit date: 02/02/2013 Discharge date: 02/07/2013  Time spent: 45 minutes  Recommendations for Outpatient Follow-up:  1. Patient discharged on prednisone 20 mg by mouth daily, dose to be adjusted on her followup visit with neurology.  Discharge Diagnoses:  Principal Problem:   Myasthenia gravis with acute exacerbation Active Problems:   End stage renal disease on dialysis   HIT (heparin-induced thrombocytopenia)   Hypertension   Discharge Condition: Stable  Diet recommendation: Heart healthy  Filed Weights   02/06/13 0726 02/06/13 1157 02/06/13 2117  Weight: 85.1 kg (187 lb 9.8 oz) 82.5 kg (181 lb 14.1 oz) 82.691 kg (182 lb 4.8 oz)    History of present illness:  72 yo male h/o esrd dialysis 4 x weekly, myasthenia gravis comes in with one week of blurred vision, difficulty in swallowing usual for his myasthenia gravis flares, Recently saw his neurologist started on oral prednisone (today is day 3), was placed on mystinon but did not tolerate it due to side affects. No sob or fevers. Instructed to come to ED by neurology  Hospital Course:  [name] is a pleasant 72 year old gentleman with a history of myasthenia gravis diagnosed in 2001, end-stage renal disease, presently on hemodialysis Mondays Wednesdays and Fridays. Presented to the emergency department with complaints of diplopia, ptosis, difficulty swallowing as well as dysarthria. Patient recently administered Mestinon 30 mg every 6 hours which resulted in improvement of symptoms unfortunately could not tolerate this medication due to gastrointestinal side effects. Patient was seen by neurology during this hospitalization who made recommendations for IVIG 0.4 mg per kilogram daily for total of 5 days. With regard to his end-stage renal disease, nephrology was consulted as patient underwent hemodialysis during  this hospitalization. Patient showed significant clinical improvement over the following days. He reported resolution to difficulty swallowing with significant improvement to right eye ptosis. Neurology made recommendations for patient to continue prednisone 20 mg by mouth daily until he sees Dr. [name] of neurology in the outpatient setting. Patient was discharged in stable condition on 02/07/2013  Procedures:  Hemodialysis  Consultations:  Neurology  Nephrology  Discharge Exam: Filed Vitals:   02/07/13 0520  BP: 121/49  Pulse: 80  Temp: 98.1 F (36.7 C)  Resp: 18    General: Patient is in no acute distress he is expressing desire to be discharged today Cardiovascular: Regular rhythm normal S1-S2  Respiratory: Clear to auscultation bilaterally no wheezing rhonchi oral Abdomen: soft nontender nontender positive bowel sound  Extremity: No cyanosis clubbing or edema  Discharge Instructions  Discharge Orders   Future Appointments Provider Department Dept Phone   08/15/2013 11:00 AM York Spaniel, MD [facility] [phone]   Future Orders Complete By Expires   Call MD for:  difficulty breathing, headache or visual disturbances  As directed    Call MD for:  persistant nausea and vomiting  As directed    Call MD for:  redness, tenderness, or signs of infection (pain, swelling, redness, odor or green/yellow discharge around incision site)  As directed    Diet - low sodium heart healthy  As directed    Discharge instructions  As directed    Comments:     We are discharging you on prednisone, take 20mg  daily until you see Dr [name] in 1 week who will adjust your medications.   Increase activity slowly  As directed  Medication List    STOP taking these medications       ibuprofen 200 MG tablet  Commonly known as:  ADVIL,MOTRIN     prednisoLONE 5 MG Tabs tablet      TAKE these medications       aspirin 81 MG tablet  Take 162 mg by mouth  daily.     calcium acetate 667 MG capsule  Commonly known as:  PHOSLO  Take 1,334-2,001 mg by mouth 5 (five) times daily. Takes 2 capsules with snacks and 3 capsules with meals     DIALYVITE 800 PO  Take 1 tablet by mouth daily.     gabapentin 300 MG capsule  Commonly known as:  NEURONTIN  Take 300 mg by mouth at bedtime as needed. For feet/nerve pain     omeprazole 20 MG capsule  Commonly known as:  PRILOSEC  Take 20 mg by mouth daily.     predniSONE 20 MG tablet  Commonly known as:  DELTASONE  Take 1 tablet (20 mg total) by mouth daily with breakfast.       Allergies  Allergen Reactions  . Avelox [Moxifloxacin Hcl In Nacl]     Pt quit breathing  . Gluten   . Ciprofloxacin Other (See Comments)    myasthenia  . Erythromycin Other (See Comments)    myasthenia  . Gemifloxacin Other (See Comments)    myasthenia  . Gentamicin Other (See Comments)    myasthenia  . Heparin Other (See Comments)    myasthenia  . Kanamycin Other (See Comments)    myasthenia  . Levofloxacin Other (See Comments)    myasthenia  . 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 Neomycin Other (See Comments)    myasthenia  . Norfloxacin Other (See Comments)    myasthenia  . Streptomycin Other (See Comments)    myasthenia  . Tobramycin Other (See Comments)    myasthenia       Follow-up Information   Follow up with [name], MD.   Specialty:  Neurology   Contact information:   [address] [place] [address] [phone]       Follow up with [name], MD.   Specialty:  Family Medicine   Contact information:   [facility] [address] [name] [address] [phone]        The results of significant diagnostics from this hospitalization (including imaging, microbiology, ancillary and laboratory) are listed below for reference.     Significant Diagnostic Studies: Dg Chest 2 View  02/02/2013   *RADIOLOGY REPORT*  Clinical Data: Chest pain.  CHEST - 2 VIEW  Comparison: Chest x-ray 09/24/2012.  Findings: Minimal scarring in the periphery of the left lung base, unchanged.  No acute consolidative airspace disease.  No pleural effusions.  No evidence of pulmonary edema.  Heart size is normal. Mediastinal contours are unremarkable.  Numerous surgical clips near the gastroesophageal junction.  Surgical clips are also noted over the right upper quadrant the abdomen, compatible with prior cholecystectomy.  IMPRESSION: 1.  No radiographic evidence of acute cardiopulmonary disease.   Original Report Authenticated By: Trudie Reed, M.D.    Microbiology: No results found for this or any previous visit (from the past 240 hour(s)).   Labs: Basic Metabolic Panel:  Recent Labs Lab 02/02/13 1805 02/03/13 0530 02/04/13 0800 02/06/13 0800  NA 137 135 134* 136  K 4.3 3.9 5.0  4.0  CL 101 100 102 104  CO2 26 24 19 22   GLUCOSE 109* 91 97 154*  BUN 29* 33* 49* 30*  CREATININE 6.89* 7.78* 8.62* 6.45*  CALCIUM 9.2 8.9 8.8 8.6  PHOS  --   --  5.3* 3.7   Liver Function Tests:  Recent Labs Lab 02/02/13 1805 02/04/13 0800 02/06/13 0800  AST 13  --   --   ALT 12  --   --   ALKPHOS 66  --   --   BILITOT 0.4  --   --   PROT 6.3  --   --   ALBUMIN 3.3* 3.2* 2.8*   No results found for this basename: LIPASE, AMYLASE,  in the last 168 hours No results found for this basename: AMMONIA,  in the last 168 hours CBC:  Recent Labs Lab 02/02/13 1805 02/03/13 0530 02/04/13 0800 02/06/13 0800  WBC 8.1 6.1 6.0 4.3  NEUTROABS 6.1  --   --   --   HGB 10.8* 9.9* 11.0* 10.4*  HCT 31.2* 28.6* 31.6* 29.9*  MCV 97.5 97.9 96.6 96.8  PLT 173 163 171 134*   Cardiac Enzymes: No results found for this basename: CKTOTAL, CKMB, CKMBINDEX, TROPONINI,  in the last 168 hours BNP: BNP (last 3 results) No results found for this basename:  PROBNP,  in the last 8760 hours CBG: No results found for this basename: GLUCAP,  in the last 168 hours     Signed:  [name]  [facility] 02/07/2013, 10:46 AM

## 2013-02-07 NOTE — Progress Notes (Signed)
Subjective:   No current complaints, feeling well, now asymptomatic  Objective: Vital signs in last 24 hours: Temp:  [96.2 F (35.7 C)-99.5 F (37.5 C)] 98.1 F (36.7 C) (09/11 0520) Pulse Rate:  [61-87] 80 (09/11 0520) Resp:  [16-20] 18 (09/11 0520) BP: (113-147)/(49-77) 121/49 mmHg (09/11 0520) SpO2:  [94 %-99 %] 94 % (09/11 0520) Weight:  [82.5 kg (181 lb 14.1 oz)-85.1 kg (187 lb 9.8 oz)] 82.691 kg (182 lb 4.8 oz) (09/10 2117) Weight change:   Intake/Output from previous day: 09/10 0701 - 09/11 0700 In: 480 [P.O.:480] Out: 2547 [Urine:700] Intake/Output this shift: Total I/O In: -  Out: 450 [Urine:450]  Lab Results:  Recent Labs  02/04/13 0800 02/06/13 0800  WBC 6.0 4.3  HGB 11.0* 10.4*  HCT 31.6* 29.9*  PLT 171 134*   BMET:  Recent Labs  02/04/13 0800 02/06/13 0800  NA 134* 136  K 5.0 4.0  CL 102 104  CO2 19 22  GLUCOSE 97 154*  BUN 49* 30*  CREATININE 8.62* 6.45*  CALCIUM 8.8 8.6  ALBUMIN 3.2* 2.8*   No results found for this basename: PTH,  in the last 72 hours Iron Studies: No results found for this basename: IRON, TIBC, TRANSFERRIN, FERRITIN,  in the last 72 hours  EXAM:  Gen: alert, in no apparent distress  Resp: CTA without rales, rhonchi, or wheezes Cardio: RRR without murmur or rub  GI: + BS, soft and nontender  Extremities: No edema  Access: AVF @ LUA with + bruit   Dialysis Orders: Home HD using NxStage with PureFlow on MWFS  BFR 400 EDW 90 kg AVF @ LUA Epogen 2000 U on Fri Venofer 200 mg every 4 wks Zemplar 2 mcg 3x/wk  Assessment/Plan: 1. Myasthenia gravis - exacerbation over last 2 wks, improved with Mestinon, but stopped 9/2 sec to GI symptoms; remains on Prednisone, s/p IVIG per day x 5 per Neurology, starting Prednisone 20 mg today.  2. Dyspnea - resolved, likely sec to #1; also below EDW, extra fluid removed with HD, now 7 kg below EDW. 3. ESRD - Home dialysis using NxStage on MWFS; MWF in hospital; last K 4. HD tomorrow.   4. Hypertension/volume - BP 121/49, stable without meds; w 82.7 kg s/p net UF 1.8 yesterday, non-oliguric. 5. Anemia - Hgb 10.4, on outpatient Epogen 2000 U qwk, last T-sat 44% on Venofer 200 mg q4wks; s/p Aranesp 25 mcg on Mon.  6. Metabolic bone disease - Ca 8.6, P 3.7, last iPTH 117; outpatient Zemplar 2 mcg 3x/wk, Phoslo 3 with meals; Hectorol 1 mcg per HD in hospital.  7. Nutrition - Alb 2.8, gluten-free diet, renal vitamin.  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   LOS: 5 days   LYLES,CHARLES 02/07/2013,6:51 AM  Patient seen and examined.  Agree with assessment and plan as above. Vinson Moselle  MD Pager [phone]    Cell  [phone] 02/07/2013, 12:23 PM

## 2013-02-07 NOTE — Progress Notes (Signed)
Discharge home.  Patient and wife educated on discharge instructions.  AVF signed.  One medication changed (prednisone).  Prednisone prescription handed to patient.  IV discontinued.  Volunteer services escorted patient to ride (wife).

## 2013-02-11 ENCOUNTER — Telehealth: Payer: Self-pay | Admitting: Neurology

## 2013-02-13 ENCOUNTER — Telehealth: Payer: Self-pay | Admitting: Neurology

## 2013-02-13 NOTE — Telephone Encounter (Signed)
Patient was recently d/c from hospital. He is on 20mg  of Prednisone daily. He is still having trouble swallowing and chewing. Please advise.

## 2013-02-13 NOTE — Telephone Encounter (Signed)
Called and discussed with Adam Walsh. She reports he is slowly getting better but continues to have difficulty swallowing. Will try increasing prednisone to 30mg  daily. Counseled her to monitor him closely for any signs of respiratory distress and/or infection and to bring him to the hospital or call 911 if noted.

## 2013-02-18 ENCOUNTER — Telehealth: Payer: Self-pay | Admitting: Neurology

## 2013-02-19 ENCOUNTER — Telehealth: Payer: Self-pay | Admitting: Neurology

## 2013-02-19 NOTE — Telephone Encounter (Signed)
Dr. [name], Perhaps you can address this when you have a quick moment.

## 2013-02-19 NOTE — Telephone Encounter (Signed)
I called patient. The patient recently has required hospitalization with IVIG for 5 days. The patient has had his prednisone increased to 30 mg daily. The patient is still having problems with chewing, swallowing, with lower extremity weakness as well. The patient has a court date tomorrow, and I have recommended that he not attend this date, and continue with rest. I'll need a revisit within the next several days.

## 2013-02-25 ENCOUNTER — Ambulatory Visit (INDEPENDENT_AMBULATORY_CARE_PROVIDER_SITE_OTHER): Payer: Medicare Other | Admitting: Neurology

## 2013-02-25 ENCOUNTER — Encounter: Payer: Self-pay | Admitting: Neurology

## 2013-02-25 VITALS — BP 147/66 | HR 71 | Wt 192.0 lb

## 2013-02-25 DIAGNOSIS — G7001 Myasthenia gravis with (acute) exacerbation: Secondary | ICD-10-CM

## 2013-02-25 MED ORDER — MYCOPHENOLATE MOFETIL 500 MG PO TABS: 500.0000 mg | ORAL_TABLET | Freq: Two times a day (BID) | ORAL | Status: DC

## 2013-02-25 MED ORDER — PREDNISONE 20 MG PO TABS: 40.0000 mg | ORAL_TABLET | Freq: Every day | ORAL | Status: DC

## 2013-02-25 NOTE — Progress Notes (Addendum)
Reason for visit: Myasthenia gravis  Adam Walsh is an 72 y.o. male  History of present illness:  Adam Walsh is a 72 year old right-handed white male with a history of myasthenia gravis. The patient has recently been in the hospital with an exacerbation of his disease in early September of 2014. The patient had increasing problems with chewing and swallowing, and he underwent a five-day course of IVIG. The patient has end-stage renal disease, on hemodialysis. He is being considered for a renal transplant. The patient could not tolerate even very low-dose Mestinon secondary to diarrhea. The patient has a history of celiac disease as well. The patient comes to this office for an evaluation. The patient has had an improvement in speech, swallowing and chewing, but this is not completely normal yet. The patient will note some blurring of vision and double vision, and he has some problems with ptosis of the eyelids. The patient reports generalized fatigue, and weakness of the muscles of the arms and legs. The patient was increased on the prednisone taking 30 mg daily currently.  Past Medical History  Diagnosis Date  . Renal insufficiency   . Hemodialysis patient     Monday, Wednesday and Friday  . HIT (heparin-induced thrombocytopenia)   . Myasthenia gravis   . Hypertension   . History of pyelonephritis   . History of nephrolithiasis   . Celiac sprue   . Hearing deficit     left hearing aid  . Neuropathy     Peripheral neuropathy  . Pancreatic cyst     Past Surgical History  Procedure Laterality Date  . Exploratory laparotomy    . Transurethral resection of prostate    . Cholecystectomy    . Av fistula placement, radiocephalic  08/03/10    Left  . Basilic vein transposition  10/06/10    2nd stage Left arm  . Hematoma evacuation  11/23/10    Left Arm     Family History  Problem Relation Age of Onset  . Stroke Mother   . Diabetes Mother   . Heart disease Brother      Social history:  reports that he quit smoking about 43 years ago. His smoking use included Cigarettes. He smoked 0.00 packs per day. He has never used smokeless tobacco. He reports that he does not drink alcohol or use illicit drugs.    Allergies  Allergen Reactions  . Avelox [Moxifloxacin Hcl In Nacl]     Pt quit breathing  . Gluten   . Ciprofloxacin Other (See Comments)    myasthenia  . Erythromycin Other (See Comments)    myasthenia  . Gemifloxacin Other (See Comments)    myasthenia  . Gentamicin Other (See Comments)    myasthenia  . Heparin Other (See Comments)    myasthenia  . Kanamycin Other (See Comments)    myasthenia  . Levofloxacin Other (See Comments)    myasthenia  . 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 Neomycin Other (See Comments)    myasthenia  . Norfloxacin Other (See Comments)    myasthenia  . Streptomycin Other (See Comments)    myasthenia  . Tobramycin Other (See Comments)    myasthenia    Medications:  Current Outpatient Prescriptions on File Prior to Visit  Medication Sig Dispense Refill  . aspirin 81 MG tablet Take 162 mg by mouth daily.       . B Complex-C-Folic Acid (DIALYVITE 800 PO) Take  1 tablet by mouth daily.      . calcium acetate (PHOSLO) 667 MG capsule Take 1,334-2,001 mg by mouth 5 (five) times daily. Takes 2 capsules with snacks and 3 capsules with meals      . gabapentin (NEURONTIN) 300 MG capsule Take 300 mg by mouth at bedtime as needed. For feet/nerve pain      . omeprazole (PRILOSEC) 20 MG capsule Take 20 mg by mouth daily.       No current facility-administered medications on file prior to visit.    ROS:  Out of a complete 14 system review of symptoms, the patient complains only of the following symptoms, and all other reviewed systems are negative:  Fatigue Hearing loss Vertigo, difficulty swallowing Blurred vision Shortness of breath Easy  bruising Feeling cold, increased thirst Weakness, slurred speech, difficulty swallowing, dizziness Anxiety, difficulty with sleep, decreased energy, change     Blood pressure 147/66, pulse 71, weight 192 lb (87.091 kg).  Physical Exam  General: The patient is alert and cooperative at the time of the examination. The patient is minimally obese.  Skin: No significant peripheral edema is noted.   Neurologic Exam  Cranial nerves: Facial symmetry is present. Speech is normal, no aphasia or dysarthria is noted. Extraocular movements are full. Mild ptosis is seen, right greater than left. Visual fields are full. With superior gaze for 1 minute, the patient reports some double vision at 30 seconds. No significant increased ptosis is seen.  Motor: The patient has good strength in all 4 extremities. With the arms outstretched 1 minute, fatigable weakness of the deltoid muscles is noted bilaterally. The patient is able to maintain abduction of the arms for 1 minute, however.  Coordination: The patient has good finger-nose-finger and heel-to-shin bilaterally.  Gait and station: The patient has a normal gait. Tandem gait is normal. Romberg is negative. No drift is seen. The patient is able to arise from a seated position with arms crossed.  Reflexes: Deep tendon reflexes are symmetric.   Assessment/Plan:  1. Myasthenia gravis  The patient has had worsening of his clinical features with chewing, swallowing, double vision, weakness of the extremities. The patient will be increased again on the prednisone taking 40 mg daily. The patient is unable to tolerate Mestinon secondary to diarrhea. The patient will be placed on CellCept. The patient followup in 3 months. He is to contact our office if he continues to worsen, as plasmapheresis will be considered in the future.  Adam Palau MD 02/25/2013 12:46 PM  [facility] [address] [place], [place]  [address]  Phone [phone] Fax [phone]

## 2013-02-26 ENCOUNTER — Ambulatory Visit: Payer: Self-pay | Admitting: Neurology

## 2013-02-26 ENCOUNTER — Other Ambulatory Visit: Payer: Self-pay | Admitting: Urology

## 2013-02-26 ENCOUNTER — Ambulatory Visit (INDEPENDENT_AMBULATORY_CARE_PROVIDER_SITE_OTHER): Payer: Medicare Other | Admitting: Urology

## 2013-02-26 DIAGNOSIS — Q549 Hypospadias, unspecified: Secondary | ICD-10-CM

## 2013-02-26 DIAGNOSIS — D4959 Neoplasm of unspecified behavior of other genitourinary organ: Secondary | ICD-10-CM

## 2013-02-26 DIAGNOSIS — N289 Disorder of kidney and ureter, unspecified: Secondary | ICD-10-CM

## 2013-02-26 DIAGNOSIS — N189 Chronic kidney disease, unspecified: Secondary | ICD-10-CM

## 2013-02-26 DIAGNOSIS — N400 Enlarged prostate without lower urinary tract symptoms: Secondary | ICD-10-CM

## 2013-02-28 ENCOUNTER — Ambulatory Visit: Payer: Self-pay | Admitting: Neurology

## 2013-03-04 ENCOUNTER — Telehealth: Payer: Self-pay | Admitting: Neurology

## 2013-03-04 ENCOUNTER — Other Ambulatory Visit: Payer: Self-pay | Admitting: Urology

## 2013-03-04 DIAGNOSIS — N2889 Other specified disorders of kidney and ureter: Secondary | ICD-10-CM

## 2013-03-04 MED ORDER — ZOLPIDEM TARTRATE 10 MG PO TABS: 10.0000 mg | ORAL_TABLET | Freq: Every evening | ORAL | Status: DC | PRN

## 2013-03-04 NOTE — Telephone Encounter (Signed)
I called patient. The patient has improved on increased steroid dosing and the CellCept. The patient is not sleeping well, I will call in a low dose of Ambien.

## 2013-03-06 ENCOUNTER — Inpatient Hospital Stay
Admission: RE | Admit: 2013-03-06 | Discharge: 2013-03-06 | Disposition: A | Discharge status: Home / Self Care | Payer: Medicare Other | Source: Ambulatory Visit | Attending: Urology | Admitting: Urology

## 2013-03-11 ENCOUNTER — Telehealth: Payer: Self-pay | Admitting: Neurology

## 2013-03-11 MED ORDER — PREDNISONE 20 MG PO TABS: 40.0000 mg | ORAL_TABLET | Freq: Every day | ORAL | Status: DC

## 2013-03-11 NOTE — Telephone Encounter (Signed)
I called patient. The patient is not sleeping well on the Ambien. The patient is better taking gabapentin at night which he has 4 restless leg syndrome. The patient is okay to continue this for the sleeping pill. A prescription for prednisone was called in. The patient was only getting 20 tablets on a refill.

## 2013-03-25 ENCOUNTER — Telehealth: Payer: Self-pay | Admitting: Neurology

## 2013-03-25 NOTE — Telephone Encounter (Signed)
Pt had a bug but continues to be really weak in his legs she is not sure if it is from the bug or diagnosis. But he seems to be sleeping better. She would like some advice

## 2013-03-26 NOTE — Telephone Encounter (Signed)
Wife called back and relayed that swallowing, eyelid ptosis and chewing better for pt.  LE still weak.  Last night took clonazepam last pm to assist sleeping and he did not sleep well.  This am dizzy and disoriented.  Did get out and shop with wife today.  I told to monitor and if worsens to call back.  Dr. [name] may call.

## 2013-03-26 NOTE — Telephone Encounter (Signed)
I called patient. The patient has generalized fatigue issues, but his speech, swallowing, vision, is doing well. The patient has not had any falls. The fatigue is a difficult issue to fully treat with myasthenia. The patient has recently been placed on CellCept, and we will need to allow this time to work. The patient remains on prednisone at 40 mg daily. If his strength the client, IVIG may be needed again. The patient will call me if problems arise.

## 2013-03-26 NOTE — Telephone Encounter (Signed)
I called and LMVM for pts wife to call back re: pt. Will also forward.

## 2013-04-09 ENCOUNTER — Telehealth: Payer: Self-pay | Admitting: Neurology

## 2013-04-09 NOTE — Telephone Encounter (Signed)
I called the patient at both numbers, left messages. The patient is having some forgetfulness, feeling poorly, I will get him in the office this week and check some blood work.

## 2013-04-09 NOTE — Telephone Encounter (Signed)
I called pt and wife after receiving message on phone at [phone], that someone just called.  Pt having facial swelling, nerves shot?  I LMVM to return call.

## 2013-04-10 ENCOUNTER — Ambulatory Visit (INDEPENDENT_AMBULATORY_CARE_PROVIDER_SITE_OTHER): Payer: Medicare Other | Admitting: Neurology

## 2013-04-10 ENCOUNTER — Encounter: Payer: Self-pay | Admitting: Neurology

## 2013-04-10 VITALS — BP 133/65 | HR 75 | Wt 192.0 lb

## 2013-04-10 DIAGNOSIS — R6889 Other general symptoms and signs: Secondary | ICD-10-CM

## 2013-04-10 DIAGNOSIS — D518 Other vitamin B12 deficiency anemias: Secondary | ICD-10-CM

## 2013-04-10 DIAGNOSIS — G7001 Myasthenia gravis with (acute) exacerbation: Secondary | ICD-10-CM

## 2013-04-10 DIAGNOSIS — F05 Delirium due to known physiological condition: Secondary | ICD-10-CM

## 2013-04-10 MED ORDER — PREDNISONE 20 MG PO TABS: 30.0000 mg | ORAL_TABLET | Freq: Every day | ORAL | Status: DC

## 2013-04-10 MED ORDER — TEMAZEPAM 30 MG PO CAPS: 30.0000 mg | ORAL_CAPSULE | Freq: Every evening | ORAL | Status: DC | PRN

## 2013-04-10 NOTE — Patient Instructions (Signed)
Reduce the prednisone to 30mg  daily. Take tempazepam 30 mg at night if needed for sleep. Do not take this with the clonazepam.   Myasthenia Gravis Myasthenia gravis is a disease that causes muscle weakness throughout the body. The muscles affected are the ones we can control (voluntary muscles). An example of a voluntary muscle is your hand muscles. You can control the muscles to make the hand pick something up. An example of an involuntary muscle is the heart. The heart beats without any direction from you.  Myasthenia Gravis is thought to be an autoimmune disease. That means that normal defenses of the body begin to attack the body. In this case, the immune system begins to attack cells located at the junctions of the muscles and the nerves. Women are affected more often. Women are affected at a younger age than men. Babies born to affected women frequently develop symptoms at an early age. SYMPTOMS Initially in the disease, the facial muscles are affected first. After this, a person may develop droopy eyelids. They may have difficulty controlling facial muscles. They may have problems chewing. Swallowing and speaking may become impaired. The weakness gradually spreads to the arms and legs. It begins to affect breathing. Sometimes, the symptoms lessen or go away without any apparent cause. DIAGNOSIS  Diagnosis can be made with blood tests. Tests such as electromyography may be done to examine the electrical activity in the muscle. An improvement in symptoms after having an anti-cholinesterase drug helps confirm the diagnosis.  TREATMENT  Medicines are usually prescribed as the first treatment. These medicines help, but they do not cure the disease. A plasma cleansing procedure (plasmapheresis) can be used to treat a crisis. It can also be used to prepare a person for surgery. This procedure produces short-term improvement. Some cases are helped by removing the thymus gland. Steroids are used for  short-term benefits. Document Released: 08/22/2000 Document Revised: 08/08/2011 Document Reviewed: 05/16/2005 [facility] Patient Information 2014 [place], [place].

## 2013-04-10 NOTE — Telephone Encounter (Signed)
Pts wife called back and appt was made for pt for 04-10-13 at 0900.

## 2013-04-10 NOTE — Progress Notes (Signed)
Reason for visit: Myasthenia gravis  Adam Walsh is an 72 y.o. male  History of present illness:  Adam Walsh is a 72 year old right-handed white male with a history of myasthenia gravis. The patient has been placed on prednisone, currently on 40 mg daily, and he is on CellCept taking 500 mg twice daily. The patient has developed increasing problems with nervousness and jitteriness, and problems with swelling around the face, irritability, and insomnia. The patient feels generally fatigued, but he is only getting 2 or 3 hours of sleep at night. The patient has taken Ambien for sleep, but this does not help. The patient goes on to say that his ability to chew, swallow, and breathing has improved. The patient no longer having double vision or ptosis. The patient will have some blurring of vision at times. The patient recently underwent a CBC that shows evidence of some anemia, but he has chronic renal insufficiency, and he is on hemodialysis. The patient gets Epogen. The patient returns to this office for an evaluation. There have been some episodes of slight confusion that have occurred off and on over the last 2 months. The patient reports some increased pressure sensations in the mid abdomen.  Past Medical History  Diagnosis Date  . Renal insufficiency   . Hemodialysis patient     Monday, Wednesday and Friday  . HIT (heparin-induced thrombocytopenia)   . Myasthenia gravis   . Hypertension   . History of pyelonephritis   . History of nephrolithiasis   . Celiac sprue   . Hearing deficit     left hearing aid  . Neuropathy     Peripheral neuropathy  . Pancreatic cyst     Past Surgical History  Procedure Laterality Date  . Exploratory laparotomy    . Transurethral resection of prostate    . Cholecystectomy    . Av fistula placement, radiocephalic  08/03/10    Left  . Basilic vein transposition  10/06/10    2nd stage Left arm  . Hematoma evacuation  11/23/10    Left Arm      Family History  Problem Relation Age of Onset  . Stroke Mother   . Diabetes Mother   . Heart disease Brother     Social history:  reports that he quit smoking about 43 years ago. His smoking use included Cigarettes. He smoked 0.00 packs per day. He has never used smokeless tobacco. He reports that he does not drink alcohol or use illicit drugs.    Allergies  Allergen Reactions  . Avelox [Moxifloxacin Hcl In Nacl]     Pt quit breathing  . Gluten   . Ciprofloxacin Other (See Comments)    myasthenia  . Erythromycin Other (See Comments)    myasthenia  . Gemifloxacin Other (See Comments)    myasthenia  . Gentamicin Other (See Comments)    myasthenia  . Heparin Other (See Comments)    myasthenia  . Kanamycin Other (See Comments)    myasthenia  . Levofloxacin Other (See Comments)    myasthenia  . 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 Neomycin Other (See Comments)    myasthenia  . Norfloxacin Other (See Comments)    myasthenia  . Streptomycin Other (See Comments)    myasthenia  . Tobramycin Other (See Comments)    myasthenia    Medications:  Current Outpatient Prescriptions on File Prior to Visit  Medication Sig Dispense Refill  .  aspirin 81 MG tablet Take 162 mg by mouth daily.       . B Complex-C-Folic Acid (DIALYVITE 800 PO) Take 1 tablet by mouth daily.      . calcium acetate (PHOSLO) 667 MG capsule Take 1,334-2,001 mg by mouth 5 (five) times daily. Takes 2 capsules with snacks and 3 capsules with meals      . clonazePAM (KLONOPIN) 0.5 MG tablet Take 0.5 mg by mouth daily as needed.      . gabapentin (NEURONTIN) 300 MG capsule Take 300 mg by mouth at bedtime as needed. For feet/nerve pain      . mycophenolate (CELLCEPT) 500 MG tablet Take 1 tablet (500 mg total) by mouth 2 (two) times daily.  60 tablet  5  . omeprazole (PRILOSEC) 20 MG capsule Take 20 mg by mouth daily.      Marland Kitchen zolpidem (AMBIEN) 10  MG tablet Take 1 tablet (10 mg total) by mouth at bedtime as needed for sleep.  20 tablet  1   No current facility-administered medications on file prior to visit.    ROS:  Out of a complete 14 system review of symptoms, the patient complains only of the following symptoms, and all other reviewed systems are negative.  Fatigue Chest pain Ringing in the ears, hearing loss Blurred vision Easy bruising Feeling hot, cold Muscle cramps Memory loss, confusion, headache, weakness, dizziness Anxiety, not enough sleep, decreased energy, racing thoughts Insomnia  Blood pressure 133/65, pulse 75, weight 192 lb (87.091 kg).  Physical Exam  General: The patient is alert and cooperative at the time of the examination. The patient is moderately obese.  Skin: No significant peripheral edema is noted.   Neurologic Exam  Mental status: The Mini-Mental status examination done today shows a total score of 24/30.  Cranial nerves: Facial symmetry is present. Speech is normal, no aphasia or dysarthria is noted. Extraocular movements are full. Visual fields are full. With superior gaze for 1 minute, there is no subjective reports of double vision, and no ptosis is seen.  Motor: The patient has good strength in all 4 extremities. With the arms outstretched for 1 minute, there is slight fatigable weakness of the left deltoid muscle, normal the right.  Sensory examination: Soft touch sensation on the face, arms, and legs is symmetric.  Coordination: The patient has good finger-nose-finger and heel-to-shin bilaterally.  Gait and station: The patient has a normal gait. Tandem gait is slightly unsteady. Romberg is negative. No drift is seen.  Reflexes: Deep tendon reflexes are symmetric.   Assessment/Plan:  One. Myasthenia gravis  2. Celiac disease  3. Chronic renal insufficiency, on hemodialysis  The patient recently has had some improvement in his myasthenic symptoms with chewing,  swallowing, breathing, blurred vision, and double vision. The patient however, is having significant side effects on the prednisone. The patient is nervous, jittery, irritable, reports facial swelling, and subsequent generalized fatigue associated with the insomnia, and some cognitive issues. The patient will undergo further blood work today, and he will undergo a CT scan of the brain. The prednisone will be reduced to 30 mg daily. The patient will remain on CellCept, and eventually the prednisone will be further reduced. The patient will followup in 3 months. The patient is not on Mestinon, secondary to intolerance with the bowels. The patient likely is also having some increased reflux issues on the prednisone. A prescription was given for Restoril to take at night for sleep. The patient is not to take clonazepam if  he takes this medication. The patient is off of the Ambien.  Marlan Palau MD 04/10/2013 9:25 AM  [facility] [address] [place], [place] [address]  Phone [phone] Fax [phone]

## 2013-04-11 ENCOUNTER — Telehealth: Payer: Self-pay | Admitting: Neurology

## 2013-04-11 LAB — COMPREHENSIVE METABOLIC PANEL
AST: 17 IU/L (ref 0–40)
Albumin: 4.1 g/dL (ref 3.5–4.8)
Alkaline Phosphatase: 49 IU/L (ref 39–117)
BUN/Creatinine Ratio: 6 — ABNORMAL LOW (ref 10–22)
BUN: 40 mg/dL — ABNORMAL HIGH (ref 8–27)
CO2: 26 mmol/L (ref 18–29)
Calcium: 10.1 mg/dL (ref 8.6–10.2)
Chloride: 103 mmol/L (ref 97–108)
Creatinine, Ser: 6.43 mg/dL — ABNORMAL HIGH (ref 0.76–1.27)
GFR calc non Af Amer: 8 mL/min/{1.73_m2} — ABNORMAL LOW (ref >59)
Globulin, Total: 2.1 g/dL (ref 1.5–4.5)
Glucose: 125 mg/dL — ABNORMAL HIGH (ref 65–99)
Sodium: 144 mmol/L (ref 134–144)
Total Protein: 6.2 g/dL (ref 6.0–8.5)

## 2013-04-11 LAB — TSH: TSH: 2.93 u[IU]/mL (ref 0.450–4.500)

## 2013-04-11 LAB — VITAMIN B12: Vitamin B-12: 611 pg/mL (ref 211–946)

## 2013-04-11 NOTE — Telephone Encounter (Signed)
I called patient. The blood work shows renal insufficiency that is chronic. The potassium level. The elevated at 5.8. Liver enzymes are unremarkable, lateral is unremarkable, B12 level is normal. I'll send the blood work to Dr. Briant Cedar.

## 2013-04-16 ENCOUNTER — Ambulatory Visit
Admission: RE | Admit: 2013-04-16 | Discharge: 2013-04-16 | Disposition: A | Discharge status: Home / Self Care | Payer: Medicare Other | Source: Ambulatory Visit | Attending: Neurology | Admitting: Neurology

## 2013-04-16 DIAGNOSIS — R51 Headache: Secondary | ICD-10-CM

## 2013-04-16 DIAGNOSIS — F05 Delirium due to known physiological condition: Secondary | ICD-10-CM

## 2013-04-19 ENCOUNTER — Telehealth: Payer: Self-pay | Admitting: Neurology

## 2013-04-19 NOTE — Telephone Encounter (Signed)
I called patient. The CT scan of the brain looks unremarkable.

## 2013-04-23 ENCOUNTER — Telehealth: Payer: Self-pay | Admitting: Neurology

## 2013-04-23 DIAGNOSIS — Z5181 Encounter for therapeutic drug level monitoring: Secondary | ICD-10-CM

## 2013-04-23 NOTE — Telephone Encounter (Signed)
I called the patient. The patient has been doing well with the last 2 weeks with the reduction in the prednisone dose to 30 mg. Suddenly, today, he has felt poorly, shaky. We'll recheck blood work. The last blood work showed an elevated potassium level of 5.8. The blood work looks unremarkable, we may reduce the prednisone dose further.

## 2013-04-23 NOTE — Telephone Encounter (Signed)
Wife said that patient has been weak,shaky but is worse today. Could it be from the decrease dosage of prednisone? He has not been on any new meds.

## 2013-04-24 ENCOUNTER — Other Ambulatory Visit (INDEPENDENT_AMBULATORY_CARE_PROVIDER_SITE_OTHER): Payer: Self-pay

## 2013-04-24 ENCOUNTER — Telehealth: Payer: Self-pay | Admitting: Neurology

## 2013-04-24 DIAGNOSIS — Z5181 Encounter for therapeutic drug level monitoring: Secondary | ICD-10-CM

## 2013-04-24 DIAGNOSIS — Z0289 Encounter for other administrative examinations: Secondary | ICD-10-CM

## 2013-04-24 LAB — CBC WITH DIFFERENTIAL
Basophils Absolute: 0 10*3/uL (ref 0.0–0.2)
Basos: 0 %
Eos: 0 %
HCT: 27.2 % — ABNORMAL LOW (ref 37.5–51.0)
Hemoglobin: 8.9 g/dL — ABNORMAL LOW (ref 12.6–17.7)
Lymphocytes Absolute: 0.7 10*3/uL (ref 0.7–3.1)
MCH: 33.1 pg — ABNORMAL HIGH (ref 26.6–33.0)
MCV: 101 fL — ABNORMAL HIGH (ref 79–97)
Monocytes Absolute: 0.3 10*3/uL (ref 0.1–0.9)
Monocytes: 3 %
Neutrophils Relative %: 90 %
Platelets: 203 10*3/uL (ref 150–379)
RBC: 2.69 x10E6/uL — CL (ref 4.14–5.80)
RDW: 12.7 % (ref 12.3–15.4)
WBC: 10.8 10*3/uL (ref 3.4–10.8)

## 2013-04-24 LAB — COMPREHENSIVE METABOLIC PANEL
ALT: 18 IU/L (ref 0–44)
AST: 13 IU/L (ref 0–40)
Albumin: 4.2 g/dL (ref 3.5–4.8)
Alkaline Phosphatase: 47 IU/L (ref 39–117)
BUN/Creatinine Ratio: 7 — ABNORMAL LOW (ref 10–22)
CO2: 25 mmol/L (ref 18–29)
Chloride: 106 mmol/L (ref 96–108)
Creatinine, Ser: 6.09 mg/dL — ABNORMAL HIGH (ref 0.76–1.27)
GFR calc Af Amer: 10 mL/min/{1.73_m2} — ABNORMAL LOW (ref >59)
GFR calc non Af Amer: 8 mL/min/{1.73_m2} — ABNORMAL LOW (ref >59)
Potassium: 4.4 mmol/L (ref 3.5–5.2)
Sodium: 142 mmol/L (ref 134–144)
Total Bilirubin: 0.3 mg/dL (ref 0.0–1.2)
Total Protein: 6.2 g/dL (ref 6.0–8.5)

## 2013-04-24 NOTE — Telephone Encounter (Signed)
I called the patient and I talked with the wife. The blood work is unremarkable with exception that the hemoglobin has dropped from 10.4 to 8.9. This will need to be followed. The nephrologist may need to go on the Epogen to boost the hemoglobin. The patient is feeling better today, and we will keep him on the same dose of the prednisone for now.

## 2013-05-01 ENCOUNTER — Telehealth: Payer: Self-pay | Admitting: Neurology

## 2013-05-01 NOTE — Telephone Encounter (Signed)
I called the patient. I talked with the wife. The patient is feeling nervous, jittery, and generally weak on prednisone at 30 mg daily. The patient developed swelling around the face. The patient reports some soreness of the tongue, but no white coating on the tongue is noted or evidence of a beefy red tongue. The patient will cut back on the prednisone taking 20 mg daily. The family is to contact me if any further issues are noted.

## 2013-05-02 ENCOUNTER — Telehealth: Payer: Self-pay | Admitting: Neurology

## 2013-05-02 MED ORDER — CLOTRIMAZOLE 10 MG MT TROC: 10.0000 mg | Freq: Every day | OROMUCOSAL | Status: DC

## 2013-05-02 NOTE — Telephone Encounter (Signed)
I called the patient and I talked with the wife. The patient may be developing oral thrush, I will call in clotrimazole for him.

## 2013-05-14 ENCOUNTER — Encounter: Payer: Self-pay | Admitting: Neurology

## 2013-05-14 ENCOUNTER — Telehealth: Payer: Self-pay | Admitting: Neurology

## 2013-05-14 ENCOUNTER — Ambulatory Visit (INDEPENDENT_AMBULATORY_CARE_PROVIDER_SITE_OTHER): Payer: Medicare Other | Admitting: Neurology

## 2013-05-14 VITALS — BP 134/66 | HR 82 | Wt 191.0 lb

## 2013-05-14 DIAGNOSIS — R531 Weakness: Secondary | ICD-10-CM

## 2013-05-14 DIAGNOSIS — G7001 Myasthenia gravis with (acute) exacerbation: Secondary | ICD-10-CM

## 2013-05-14 DIAGNOSIS — R5381 Other malaise: Secondary | ICD-10-CM

## 2013-05-14 MED ORDER — PREDNISONE 5 MG PO TABS: 15.0000 mg | ORAL_TABLET | Freq: Every day | ORAL | Status: DC

## 2013-05-14 NOTE — Patient Instructions (Signed)
Myasthenia Gravis Myasthenia gravis is a disease that causes muscle weakness throughout the body. The muscles affected are the ones we can control (voluntary muscles). An example of a voluntary muscle is your hand muscles. You can control the muscles to make the hand pick something up. An example of an involuntary muscle is the heart. The heart beats without any direction from you.  Myasthenia Gravis is thought to be an autoimmune disease. That means that normal defenses of the body begin to attack the body. In this case, the immune system begins to attack cells located at the junctions of the muscles and the nerves. Women are affected more often. Women are affected at a younger age than men. Babies born to affected women frequently develop symptoms at an early age. SYMPTOMS Initially in the disease, the facial muscles are affected first. After this, a person may develop droopy eyelids. They may have difficulty controlling facial muscles. They may have problems chewing. Swallowing and speaking may become impaired. The weakness gradually spreads to the arms and legs. It begins to affect breathing. Sometimes, the symptoms lessen or go away without any apparent cause. DIAGNOSIS  Diagnosis can be made with blood tests. Tests such as electromyography may be done to examine the electrical activity in the muscle. An improvement in symptoms after having an anti-cholinesterase drug helps confirm the diagnosis.  TREATMENT  Medicines are usually prescribed as the first treatment. These medicines help, but they do not cure the disease. A plasma cleansing procedure (plasmapheresis) can be used to treat a crisis. It can also be used to prepare a person for surgery. This procedure produces short-term improvement. Some cases are helped by removing the thymus gland. Steroids are used for short-term benefits. Document Released: 08/22/2000 Document Revised: 08/08/2011 Document Reviewed: 05/16/2005 [employer] Patient  Information 2014 [employer].  

## 2013-05-14 NOTE — Progress Notes (Signed)
Reason for visit: Myasthenia gravis  Adam Walsh is an 72 y.o. male  History of present illness:  Mr. Kiester is a 72 year old right-handed white male with a history of myasthenia gravis. The patient indicates that he has felt poorly over the last several days, continues to be somewhat tremulous and nervous on prednisone. The patient was cut back to 20 mg daily of prednisone 2 weeks ago. The patient feels weak all over. The patient denies any falls, and he denies any double vision, ptosis, difficulty chewing or swallowing. The patient denies any difficulty breathing. The patient indicates that he had a low-grade fever this morning. The patient indicates that his tongue is uncomfortable, and he is on Mycelex oral troches currently. The patient feels dizzy with standing, but he denies any blackout episodes. The patient returns to this office for an evaluation.  Past Medical History  Diagnosis Date  . Renal insufficiency   . Hemodialysis patient     Monday, Wednesday and Friday  . HIT (heparin-induced thrombocytopenia)   . Myasthenia gravis   . Hypertension   . History of pyelonephritis   . History of nephrolithiasis   . Celiac sprue   . Hearing deficit     left hearing aid  . Neuropathy     Peripheral neuropathy  . Pancreatic cyst     Past Surgical History  Procedure Laterality Date  . Exploratory laparotomy    . Transurethral resection of prostate    . Cholecystectomy    . Av fistula placement, radiocephalic  08/03/10    Left  . Basilic vein transposition  10/06/10    2nd stage Left arm  . Hematoma evacuation  11/23/10    Left Arm     Family History  Problem Relation Age of Onset  . Stroke Mother   . Diabetes Mother   . Heart disease Brother     Social history:  reports that he quit smoking about 43 years ago. His smoking use included Cigarettes. He smoked 0.00 packs per day. He has never used smokeless tobacco. He reports that he does not drink alcohol or use  illicit drugs.    Allergies  Allergen Reactions  . Avelox [Moxifloxacin Hcl In Nacl]     Pt quit breathing  . Gluten   . Ciprofloxacin Other (See Comments)    myasthenia  . Erythromycin Other (See Comments)    myasthenia  . Gemifloxacin Other (See Comments)    myasthenia  . Gentamicin Other (See Comments)    myasthenia  . Heparin Other (See Comments)    myasthenia  . Kanamycin Other (See Comments)    myasthenia  . Levofloxacin Other (See Comments)    myasthenia  . 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 Neomycin Other (See Comments)    myasthenia  . Norfloxacin Other (See Comments)    myasthenia  . Streptomycin Other (See Comments)    myasthenia  . Tobramycin Other (See Comments)    myasthenia    Medications:  Current Outpatient Prescriptions on File Prior to Visit  Medication Sig Dispense Refill  . aspirin 81 MG tablet Take 162 mg by mouth daily.       . B Complex-C-Folic Acid (DIALYVITE 800 PO) Take 1 tablet by mouth daily.      . calcium acetate (PHOSLO) 667 MG capsule Take 1,334-2,001 mg by mouth 5 (five) times daily. Takes 2 capsules with snacks and 3 capsules with meals      .  clonazePAM (KLONOPIN) 0.5 MG tablet Take 0.5 mg by mouth daily as needed.      . clotrimazole (MYCELEX) 10 MG troche Take 1 tablet (10 mg total) by mouth 5 (five) times daily. Use for 14 days  70 tablet  0  . gabapentin (NEURONTIN) 300 MG capsule Take 300 mg by mouth at bedtime as needed. For feet/nerve pain      . mycophenolate (CELLCEPT) 500 MG tablet Take 1 tablet (500 mg total) by mouth 2 (two) times daily.  60 tablet  5  . omeprazole (PRILOSEC) 20 MG capsule Take 20 mg by mouth daily.      . temazepam (RESTORIL) 30 MG capsule Take 1 capsule (30 mg total) by mouth at bedtime as needed for sleep.  30 capsule  0  . zolpidem (AMBIEN) 10 MG tablet Take 1 tablet (10 mg total) by mouth at bedtime as needed for sleep.  20 tablet  1    No current facility-administered medications on file prior to visit.    ROS:  Out of a complete 14 system review of symptoms, the patient complains only of the following symptoms, and all other reviewed systems are negative.  Hearing loss Food allergies, celiac disease Muscle cramps Bruising easily Dizziness, headache, weakness  Blood pressure 134/66, pulse 82, weight 191 lb (86.637 kg).  Blood pressure, standing, right arm is 142/70. Blood pressure, sitting, right arm is 128/60.  Physical Exam  General: The patient is alert and cooperative at the time of the examination.  Skin: No significant peripheral edema is noted.   Neurologic Exam  Mental status: The patient is oriented x 3.  Cranial nerves: Facial symmetry is present. Speech is normal, no aphasia or dysarthria is noted. Extraocular movements are full. Visual fields are full. The superior deviation of the eyes for 1 minute, no ptosis or double vision is noted. Jaw strength is full and normal with closure and opening. Facial muscle strength is symmetric and normal.  Motor: The patient has good strength in all 4 extremities. With arms outstretched 1 minute, minimal bilateral fatigue of the deltoid muscles is noted.  Sensory examination: Soft touch sensation on the face, arms, and legs is symmetric.  Coordination: The patient has good finger-nose-finger and heel-to-shin bilaterally.  Gait and station: The patient has a normal gait. Tandem gait is normal. Romberg is negative. No drift is seen.  Reflexes: Deep tendon reflexes are symmetric.   Assessment/Plan:  1. Myasthenia gravis  2. End-stage renal disease on dialysis  The patient indicates some increased nervousness, jitteriness, sensation of fatigue. The patient likely is not tolerating the prednisone well. The prednisone will be dropping into 15 mg daily. The patient will remain on CellCept. The blood work done 2 weeks ago showed a drop in hemoglobin, and  this needs to be checked again. Thyroid profile and B12 levels recently were unremarkable. The patient will followup in 3-4 months.  Marlan Palau MD 05/14/2013 1:18 PM  [facility] [address] [place], [place] [address]  Phone [phone] Fax [phone]

## 2013-05-14 NOTE — Telephone Encounter (Signed)
I called patient. The patient indicates that he has felt generally weak, he may have had a transient low-grade fever this morning of 99.1, but recheck revealed a normal temperature. I'll see the patient in the office, may need to make medication readjustments, check blood work. The patient will come into the office today.

## 2013-05-14 NOTE — Telephone Encounter (Signed)
Patient said that he feels as though he will pass out upon standing, esophagus is hurting a lot, not sure if this is coming from prednisone, has been on it a long time.

## 2013-05-15 ENCOUNTER — Telehealth: Payer: Self-pay | Admitting: Neurology

## 2013-05-15 ENCOUNTER — Ambulatory Visit: Payer: Self-pay | Admitting: Neurology

## 2013-05-15 LAB — URINALYSIS, ROUTINE W REFLEX MICROSCOPIC
Leukocytes, UA: NEGATIVE
Nitrite, UA: NEGATIVE
Specific Gravity, UA: 1.01 (ref 1.005–1.030)
Urobilinogen, Ur: 0.2 mg/dL (ref 0.0–1.9)

## 2013-05-15 LAB — CBC WITH DIFFERENTIAL
Basophils Absolute: 0 10*3/uL (ref 0.0–0.2)
Eos: 0 %
HCT: 25.8 % — ABNORMAL LOW (ref 37.5–51.0)
Lymphs: 7 %
Monocytes: 2 %
Neutrophils Absolute: 7.6 10*3/uL — ABNORMAL HIGH (ref 1.4–7.0)
Platelets: 255 10*3/uL (ref 150–379)
RBC: 2.65 x10E6/uL — CL (ref 4.14–5.80)
RDW: 12.7 % (ref 12.3–15.4)
WBC: 8.4 10*3/uL (ref 3.4–10.8)

## 2013-05-15 LAB — MICROSCOPIC EXAMINATION: WBC, UA: NONE SEEN /hpf (ref 0–?)

## 2013-05-15 NOTE — Telephone Encounter (Signed)
I called patient, talked with the wife. The hemoglobin remains low with 8.5, relatively stable from the last check, but this needs to be followed over time. The Epogen dose has been increased. The urinalysis did not show evidence of a urinary tract infection. The patient feels somewhat better today. I will send these blood work reports to his doctor's.

## 2013-05-20 ENCOUNTER — Telehealth: Payer: Self-pay | Admitting: Neurology

## 2013-05-20 NOTE — Telephone Encounter (Signed)
I called patient. I talked with the wife. The patient still feeling nervous and jittery. The patient has had some nausea today. No fevers. The patient has felt poorly since he started the CellCept. Given the gradual decline in the hemoglobin, I will stop the CellCept at this time. The patient will remain on the prednisone at 15 mg daily. If the patient continues to feel poorly, he will go to the emergency room have blood work checked.

## 2013-05-20 NOTE — Telephone Encounter (Signed)
HAS HEADACHE, REALLY SHAKY, NAUSEATED, CHEST AROUND ESOPHAGUS HURTING AND STAYS COLD-HAD BAD WEEKEND AND FEELS WORE TODAY

## 2013-06-07 ENCOUNTER — Ambulatory Visit: Payer: Medicare Other | Admitting: Neurology

## 2013-08-14 ENCOUNTER — Telehealth: Payer: Self-pay | Admitting: Neurology

## 2013-08-14 DIAGNOSIS — M25552 Pain in left hip: Secondary | ICD-10-CM

## 2013-08-14 DIAGNOSIS — M25562 Pain in left knee: Secondary | ICD-10-CM

## 2013-08-14 DIAGNOSIS — M25551 Pain in right hip: Secondary | ICD-10-CM

## 2013-08-14 DIAGNOSIS — R5383 Other fatigue: Secondary | ICD-10-CM

## 2013-08-14 DIAGNOSIS — M25561 Pain in right knee: Secondary | ICD-10-CM

## 2013-08-14 DIAGNOSIS — G8929 Other chronic pain: Secondary | ICD-10-CM

## 2013-08-14 DIAGNOSIS — Z5181 Encounter for therapeutic drug level monitoring: Secondary | ICD-10-CM

## 2013-08-14 DIAGNOSIS — R5381 Other malaise: Secondary | ICD-10-CM

## 2013-08-14 NOTE — Telephone Encounter (Signed)
Patient's wife called to state that patient needs to be seen sooner than 09/27/13 appointment, states that she did not cancel the appointment for tomorrow. Please call and advise patient.

## 2013-08-14 NOTE — Telephone Encounter (Signed)
Called pt and spoke with pt's wife Jana Half concerning an sooner appt before 09/27/13. I made an appt for the patient for 08/29/13 with Dr. Jannifer Franklin and I advised the pt's wife that if the pt has any other problems, questions or concerns to call the office. Pt's wife verbalized understanding.

## 2013-08-15 ENCOUNTER — Ambulatory Visit: Payer: Medicare Other | Admitting: Neurology

## 2013-08-16 NOTE — Telephone Encounter (Signed)
I called patient. The patient is doing well with his myasthenia symptoms, but over the last month he's had diffuse arthralgias that have come on. The patient denies any fevers, inflammation of the joints or swelling of the joints. The patient has generalized fatigue, and he still feels jittery even with the reduction in prednisone dose. The patient is on prednisone at 15 mg daily. I will check blood work, and the patient will be seen in office on 08/29/2013.

## 2013-08-16 NOTE — Telephone Encounter (Signed)
Pt called needs Dr. Jannifer Franklin to c/b concerning his bones hurting and other things going on with pt.

## 2013-08-16 NOTE — Telephone Encounter (Signed)
Pt is calling requesting that Dr. Jannifer Franklin give him a call back concerning his bones hurting and other things going on with him. Please advise

## 2013-08-19 ENCOUNTER — Other Ambulatory Visit: Payer: Self-pay | Admitting: Neurology

## 2013-08-29 ENCOUNTER — Encounter: Payer: Self-pay | Admitting: Neurology

## 2013-08-29 ENCOUNTER — Ambulatory Visit (INDEPENDENT_AMBULATORY_CARE_PROVIDER_SITE_OTHER): Payer: Medicare Other | Admitting: Neurology

## 2013-08-29 ENCOUNTER — Encounter (INDEPENDENT_AMBULATORY_CARE_PROVIDER_SITE_OTHER): Payer: Self-pay

## 2013-08-29 VITALS — BP 153/74 | HR 74 | Wt 199.0 lb

## 2013-08-29 DIAGNOSIS — G7001 Myasthenia gravis with (acute) exacerbation: Secondary | ICD-10-CM

## 2013-08-29 NOTE — Patient Instructions (Signed)
Myasthenia Gravis Myasthenia gravis is a disease that causes muscle weakness throughout the body. The muscles affected are the ones we can control (voluntary muscles). An example of a voluntary muscle is your hand muscles. You can control the muscles to make the hand pick something up. An example of an involuntary muscle is the heart. The heart beats without any direction from you.  Myasthenia Gravis is thought to be an autoimmune disease. That means that normal defenses of the body begin to attack the body. In this case, the immune system begins to attack cells located at the junctions of the muscles and the nerves. Women are affected more often. Women are affected at a younger age than men. Babies born to affected women frequently develop symptoms at an early age. SYMPTOMS Initially in the disease, the facial muscles are affected first. After this, a person may develop droopy eyelids. They may have difficulty controlling facial muscles. They may have problems chewing. Swallowing and speaking may become impaired. The weakness gradually spreads to the arms and legs. It begins to affect breathing. Sometimes, the symptoms lessen or go away without any apparent cause. DIAGNOSIS  Diagnosis can be made with blood tests. Tests such as electromyography may be done to examine the electrical activity in the muscle. An improvement in symptoms after having an anti-cholinesterase drug helps confirm the diagnosis.  TREATMENT  Medicines are usually prescribed as the first treatment. These medicines help, but they do not cure the disease. A plasma cleansing procedure (plasmapheresis) can be used to treat a crisis. It can also be used to prepare a person for surgery. This procedure produces short-term improvement. Some cases are helped by removing the thymus gland. Steroids are used for short-term benefits. Document Released: 08/22/2000 Document Revised: 08/08/2011 Document Reviewed: 05/16/2005 [employer] Patient  Information 2014 [employer].  

## 2013-08-29 NOTE — Progress Notes (Signed)
Reason for visit: Myasthenia gravis  Adam Walsh is an 73 y.o. male  History of present illness:  [name] is a 73 year old right-handed white male with a history of myasthenia gravis. The patient has done well with his myasthenic symptoms, but within the last 6-8 weeks, he has developed diffuse arthralgias that mainly affected the neck, shoulders, but is also affecting other joints in the body. The patient indicates that he has a lot of stiffness in the morning, and as the day goes on, he tends to feel better. The patient thought that the CellCept was causing a lot of symptoms, but going off the medication did not help. The patient is on 15 mg daily of prednisone, and he still feels jittery on the medication. The patient does not report problems with breathing, or chewing, swallowing, or problems with ptosis or double vision. The patient has end-stage renal disease, and he indicates that recently he was taken off of Epogen, the patient has developed some increased anemia over the last 4 months going from hemoglobin of around 10 to a hemoglobin of around 8. The patient is receiving IV iron treatments currently.  Past Medical History  Diagnosis Date  . Renal insufficiency   . Hemodialysis patient     Monday, Wednesday and Friday  . HIT (heparin-induced thrombocytopenia)   . Myasthenia gravis   . Hypertension   . History of pyelonephritis   . History of nephrolithiasis   . Celiac sprue   . Hearing deficit     left hearing aid  . Neuropathy     Peripheral neuropathy  . Pancreatic cyst     Past Surgical History  Procedure Laterality Date  . Exploratory laparotomy    . Transurethral resection of prostate    . Cholecystectomy    . Av fistula placement, radiocephalic  95/62/13    Left  . Basilic vein transposition  10/06/10    2nd stage Left arm  . Hematoma evacuation  11/23/10    Left Arm     Family History  Problem Relation Age of Onset  . Stroke Mother   . Diabetes  Mother   . Heart disease Brother     Social history:  reports that he quit smoking about 44 years ago. His smoking use included Cigarettes. He smoked 0.00 packs per day. He has never used smokeless tobacco. He reports that he does not drink alcohol or use illicit drugs.    Allergies  Allergen Reactions  . Avelox [Moxifloxacin Hcl In Nacl]     Pt quit breathing  . Gluten   . Gluten Meal Anaphylaxis  . Ciprofloxacin Other (See Comments)    myasthenia  . Erythromycin Other (See Comments)    myasthenia  . Gemifloxacin Other (See Comments)    myasthenia  . Gentamicin Other (See Comments)    myasthenia  . Heparin Other (See Comments)    myasthenia  . Kanamycin Other (See Comments)    myasthenia  . Levofloxacin Other (See Comments)    myasthenia  . 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 Neomycin Other (See Comments)    myasthenia  . Norfloxacin Other (See Comments)    myasthenia  . Streptomycin Other (See Comments)    myasthenia  . Tobramycin Other (See Comments)    myasthenia    Medications:  Current Outpatient Prescriptions on File Prior to Visit  Medication Sig Dispense Refill  . aspirin 81 MG tablet  Take 162 mg by mouth daily.       . B Complex-C-Folic Acid (DIALYVITE 540 PO) Take 1 tablet by mouth daily.      . calcium acetate (PHOSLO) 667 MG capsule Take 1,334-2,001 mg by mouth 5 (five) times daily. Takes 2 capsules with snacks and 3 capsules with meals      . clonazePAM (KLONOPIN) 0.5 MG tablet Take 0.5 mg by mouth daily as needed.      . clotrimazole (MYCELEX) 10 MG troche Take 1 tablet (10 mg total) by mouth 5 (five) times daily. Use for 14 days  70 tablet  0  . gabapentin (NEURONTIN) 300 MG capsule Take 300 mg by mouth at bedtime as needed. For feet/nerve pain      . omeprazole (PRILOSEC) 20 MG capsule Take 20 mg by mouth daily.      . predniSONE (DELTASONE) 5 MG tablet Take 3 tablets (15 mg total)  by mouth daily with breakfast.  90 tablet  3  . temazepam (RESTORIL) 30 MG capsule Take 1 capsule (30 mg total) by mouth at bedtime as needed for sleep.  30 capsule  0  . zolpidem (AMBIEN) 10 MG tablet Take 1 tablet (10 mg total) by mouth at bedtime as needed for sleep.  20 tablet  1   No current facility-administered medications on file prior to visit.    ROS:  Out of a complete 14 system review of symptoms, the patient complains only of the following symptoms, and all other reviewed systems are negative.  Fatigue Neck pain, neck stiffness Abdominal pain Restless leg Food allergies Joint pain, achy muscles, muscle cramps, walking difficulties Bruising easily Headache  Blood pressure 153/74, pulse 74, weight 199 lb (90.266 kg).  Physical Exam  General: The patient is alert and cooperative at the time of the examination.  Skin: No significant peripheral edema is noted.   Neurologic Exam  Mental status: The patient is oriented x 3.  Cranial nerves: Facial symmetry is present. Speech is normal, no aphasia or dysarthria is noted. Extraocular movements are full. Visual fields are full. With superior gaze for 1 minute, no ptosis or divergent gaze is noted. No subjective double vision is noted. Jaw muscle strength is normal.  Motor: The patient has good strength in all 4 extremities. With the arms outstretched for 1 minute, no fatigable weakness of the deltoid muscles is noted. The patient has pain however, with abduction of the arms.  Sensory examination: Soft sensation is symmetric on the face, arms, and legs.  Coordination: The patient has good finger-nose-finger and heel-to-shin bilaterally.  Gait and station: The patient has a normal gait. Tandem gait is normal. Romberg is negative. No drift is seen.  Reflexes: Deep tendon reflexes are symmetric.   Assessment/Plan:  1. Myasthenia gravis  2. Celiac disease  3. Diffuse myalgias  Recent blood work done shows a normal  hemoglobin at this point is 12.8. The patient will be restarted on CellCept. The patient may cut back to 12.5 mg of prednisone daily. Given the diffuse arthralgias, we will consider a referral to a rheumatologist to determine if any other issues are creating this new symptom, but the patient does not wish to do this now. Blood work does not show an elevated sed rate, and the ANA, rheumatoid factor, and Lyme antibody panel is unremarkable. Vitamin B12 level is normal. The patient followup in 4 months.  Jill Alexanders MD 08/29/2013 9:36 PM  [facility] [address]  [place], [place] [address]  Phone [phone] Fax [phone]

## 2013-09-03 ENCOUNTER — Telehealth: Payer: Self-pay | Admitting: Neurology

## 2013-09-03 DIAGNOSIS — G8929 Other chronic pain: Secondary | ICD-10-CM

## 2013-09-03 DIAGNOSIS — G7000 Myasthenia gravis without (acute) exacerbation: Secondary | ICD-10-CM

## 2013-09-03 DIAGNOSIS — M25562 Pain in left knee: Secondary | ICD-10-CM

## 2013-09-03 DIAGNOSIS — M25551 Pain in right hip: Secondary | ICD-10-CM

## 2013-09-03 DIAGNOSIS — M25552 Pain in left hip: Secondary | ICD-10-CM

## 2013-09-03 DIAGNOSIS — M25561 Pain in right knee: Secondary | ICD-10-CM

## 2013-09-03 NOTE — Telephone Encounter (Signed)
Pt's wife Jana Half called states pt is getting worse and states that at pt's last visit w/Dr. Jannifer Franklin he advised pt to call him to get a referral for a rheumatologist. Please call Jana Half concerning this matter. Thanks

## 2013-09-04 NOTE — Telephone Encounter (Signed)
I called the patient, and I talked with the wife. The patient is having increasing problems with arthralgias throughout the body. I recommended seeing a rheumatologist previously, they are now amenable to this. I'll get the referral set up.

## 2013-09-17 ENCOUNTER — Telehealth: Payer: Self-pay | Admitting: Neurology

## 2013-09-17 NOTE — Telephone Encounter (Signed)
Patient's wife Adam Walsh calling to state that patient's arms are so sore that he can't even raise them up. Please call and advise.

## 2013-09-17 NOTE — Telephone Encounter (Signed)
Called pt and pt stated that he is having a lot of pain in his shoulders and arms and that he has an appt set up to see a Rheumatologist next week. Pt stated that he does not think that she would be able to help him. Pt would like Dr. Jannifer Franklin to give him a call back please. Thanks

## 2013-09-17 NOTE — Telephone Encounter (Signed)
I called patient. The patient is having increasing problems with arthralgias mainly affecting the arms, shoulders, and neck. Blood work done previously was unremarkable. The patient is to see a rheumatologist, Dr. Trudie Reed on 09/26/2013. I encouraged him to keep that appointment. The patient believes that the problem is with the use of prednisone, I want to make sure that there is not another arthritis issue at work.

## 2013-09-19 ENCOUNTER — Other Ambulatory Visit: Payer: Self-pay | Admitting: Neurology

## 2013-09-27 ENCOUNTER — Ambulatory Visit: Payer: Medicare Other | Admitting: Neurology

## 2013-10-01 ENCOUNTER — Telehealth: Payer: Self-pay | Admitting: Neurology

## 2013-10-01 NOTE — Telephone Encounter (Signed)
Wife called because the patients Rheumatologist increased his prednisone and now patient is extremely tired and having some trouble with his eye site and breathing. She doesn't know if it was from dust or if the patient was just tired.

## 2013-10-01 NOTE — Telephone Encounter (Signed)
I called patient, talked with the wife. The patient was seen by Dr. Trudie Reed, and the prednisone dose was increased to 20 mg daily. With this increased, the patient has felt more jittery, generally weak and tired, with increased blurred vision. When the prednisone dose is reduced, he has increased arthralgias. The increase in the prednisone dose has helped the arthralgias. The patient is on hemodialysis, and he will be having blood work checked in the next several days. We need to make sure that the blood sugars are not elevated.

## 2013-10-15 ENCOUNTER — Telehealth: Payer: Self-pay | Admitting: *Deleted

## 2013-10-15 NOTE — Telephone Encounter (Signed)
I called patient, talked with the wife. The patient has seen Dr. Trudie Reed, no etiology of the arthralgias noted, felt to be osteoarthritis. The patient was increased on the prednisone taking 25 mg daily. We will get a revisit and four-week. The patient is having some sensitivity to light with his eyes, may need to see an ophthalmologist.

## 2013-10-15 NOTE — Telephone Encounter (Signed)
Pt calling stating that he saw Dr. Trudie Reed and the Dr. Doristine Section him on prednison and wants pt to see Dr. Jannifer Franklin in a month to discuss medication changes. Please advise

## 2013-10-25 ENCOUNTER — Telehealth: Payer: Self-pay | Admitting: *Deleted

## 2013-10-25 NOTE — Telephone Encounter (Signed)
Called pt and left message for pt to call back.

## 2013-10-28 ENCOUNTER — Telehealth: Payer: Self-pay | Admitting: Neurology

## 2013-10-28 NOTE — Telephone Encounter (Signed)
Patient's wife returning Cathy's call from Friday. She would like a call back as her husband is having issues.

## 2013-10-28 NOTE — Telephone Encounter (Signed)
Gd. Morning  Can you look into this guys chart and see if you are able to see him. Dr. Jannifer Franklin referred him to the rheumotologist and he has a f/u on 6/19. Dr. Jannifer Franklin did not want to see him any sooner but he keeps calling.

## 2013-10-28 NOTE — Telephone Encounter (Signed)
R/t call spoke with patient's wife to see if he wanted to come in today and see Megan. She will tell him when he comes in and he will call office to be put on Megan schedule this afternoon.

## 2013-11-15 ENCOUNTER — Encounter: Payer: Self-pay | Admitting: Neurology

## 2013-11-15 ENCOUNTER — Ambulatory Visit (INDEPENDENT_AMBULATORY_CARE_PROVIDER_SITE_OTHER): Payer: Medicare Other | Admitting: Neurology

## 2013-11-15 VITALS — BP 115/64 | HR 155 | Wt 188.0 lb

## 2013-11-15 DIAGNOSIS — R5381 Other malaise: Secondary | ICD-10-CM

## 2013-11-15 DIAGNOSIS — G7000 Myasthenia gravis without (acute) exacerbation: Secondary | ICD-10-CM

## 2013-11-15 DIAGNOSIS — R5383 Other fatigue: Secondary | ICD-10-CM

## 2013-11-15 NOTE — Progress Notes (Signed)
Reason for visit: Myasthenia gravis  Adam Walsh is an 73 y.o. male  History of present illness:  Adam Walsh is a 73 year old right-handed white male with a history of myasthenia gravis. He developed diffuse arthralgias at one point, and he was referred to Dr. Trudie Reed for an evaluation. No specific diagnosis was obtained. The patient was increased on the prednisone taking 25 mg daily, but subsequently, he has tapered back down to 15 mg daily within the last 2 weeks. He indicates that the arthralgias have not returned. He does report diffuse fatigue issues and muscular weakness however. He denies any ptosis, double vision, or problems swallowing. He will have some discomfort in the center of the chest at times. He feels dizzy at times. He is on CellCept as well. He has had recent blood work done showing a hemoglobin of 8.2, white count of 9.6. Liver profile was unremarkable, and the MCV on the CBC was 104. The patient in the past has had diarrhea with the Mestinon. The return to this office for an evaluation. The patient does not feel well in general. He has cramping of the hands frequently, daily on days of hemodialysis.  Past Medical History  Diagnosis Date  . Renal insufficiency   . Hemodialysis patient     Monday, Wednesday and Friday  . HIT (heparin-induced thrombocytopenia)   . Myasthenia gravis   . Hypertension   . History of pyelonephritis   . History of nephrolithiasis   . Celiac sprue   . Hearing deficit     left hearing aid  . Neuropathy     Peripheral neuropathy  . Pancreatic cyst     Past Surgical History  Procedure Laterality Date  . Exploratory laparotomy    . Transurethral resection of prostate    . Cholecystectomy    . Av fistula placement, radiocephalic  26/20/35    Left  . Basilic vein transposition  10/06/10    2nd stage Left arm  . Hematoma evacuation  11/23/10    Left Arm     Family History  Problem Relation Age of Onset  . Stroke Mother   .  Diabetes Mother   . Heart disease Brother     Social history:  reports that he quit smoking about 44 years ago. His smoking use included Cigarettes. He smoked 0.00 packs per day. He has never used smokeless tobacco. He reports that he does not drink alcohol or use illicit drugs.    Allergies  Allergen Reactions  . Avelox [Moxifloxacin Hcl In Nacl]     Pt quit breathing  . Gluten   . Gluten Meal Anaphylaxis  . Ciprofloxacin Other (See Comments)    myasthenia  . Erythromycin Other (See Comments)    myasthenia  . Gemifloxacin Other (See Comments)    myasthenia  . Gentamicin Other (See Comments)    myasthenia  . Heparin Other (See Comments)    myasthenia  . Kanamycin Other (See Comments)    myasthenia  . Levofloxacin Other (See Comments)    myasthenia  . 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 Neomycin Other (See Comments)    myasthenia  . Norfloxacin Other (See Comments)    myasthenia  . Streptomycin Other (See Comments)    myasthenia  . Tobramycin Other (See Comments)    myasthenia    Medications:  Current Outpatient Prescriptions on File Prior to Visit  Medication Sig Dispense Refill  .  aspirin 81 MG tablet Take 162 mg by mouth daily.       . B Complex-C-Folic Acid (DIALYVITE 536 PO) Take 1 tablet by mouth daily.      . calcium acetate (PHOSLO) 667 MG capsule Take 1,334-2,001 mg by mouth 5 (five) times daily. Takes 2 capsules with snacks and 3 capsules with meals      . clonazePAM (KLONOPIN) 0.5 MG tablet Take 0.5 mg by mouth daily as needed.      . clotrimazole (MYCELEX) 10 MG troche Take 1 tablet (10 mg total) by mouth 5 (five) times daily. Use for 14 days  70 tablet  0  . folic acid-vitamin b complex-vitamin c-selenium-zinc (DIALYVITE) 3 MG TABS tablet Take 30 mg by mouth daily.      Marland Kitchen gabapentin (NEURONTIN) 300 MG capsule Take 300 mg by mouth at bedtime as needed. For feet/nerve pain      .  mycophenolate (CELLCEPT) 500 MG tablet Take 500 mg by mouth 2 (two) times daily.      Marland Kitchen omeprazole (PRILOSEC) 20 MG capsule Take 20 mg by mouth daily.      . paricalcitol (ZEMPLAR) 2 MCG/ML injection Inject 1 mL into the vein once a week.      . predniSONE (DELTASONE) 5 MG tablet 3 tablets daily      . temazepam (RESTORIL) 30 MG capsule Take 1 capsule (30 mg total) by mouth at bedtime as needed for sleep.  30 capsule  0  . zolpidem (AMBIEN) 10 MG tablet Take 1 tablet (10 mg total) by mouth at bedtime as needed for sleep.  20 tablet  1   No current facility-administered medications on file prior to visit.    ROS:  Out of a complete 14 system review of symptoms, the patient complains only of the following symptoms, and all other reviewed systems are negative.  Fatigue Facial swelling, neck pain, neck stiffness, hearing loss, ringing in the ears Chest tightness Diarrhea Restless legs Food allergies Muscle cramps, walking difficulties, coordination problems Bruising easily Memory loss, headache, speech difficulty, weakness Agitation  Blood pressure 115/64, pulse 155, weight 188 lb (85.276 kg).  Physical Exam  General: The patient is alert and cooperative at the time of the examination.  Skin: No significant peripheral edema is noted.   Neurologic Exam  Mental status: The patient is oriented x 3.  Cranial nerves: Facial symmetry is present. Speech is normal, no aphasia or dysarthria is noted. Extraocular movements are full. Visual fields are full. With superior gaze for 1 minute, no ptosis or reports of double vision are noted.  Motor: The patient has good strength in all 4 extremities. With the arms outstretched 1 minute, the deltoid muscles fatigue at around 1 minute.  Sensory examination: Soft touch sensation on the face, arms, and legs is symmetric.  Coordination: The patient has good finger-nose-finger and heel-to-shin bilaterally.  Gait and station: The patient has a  normal gait. Tandem gait is normal. Romberg is negative. No drift is seen. The patient is able to arise from a seated position with arms crossed.  Reflexes: Deep tendon reflexes are symmetric.   Assessment/Plan:  One. Myasthenia gravis  2. Arthralgias  3. End-stage renal disease on hemodialysis  The patient is continued to have ongoing problems with fatigue. We will check further blood work today. The patient will give a retrial on Mestinon to see if he can tolerate taking 30 mg twice daily. If diarrhea ensues, he is to stop the medication. He will  continue the prednisone and the CellCept at the current dose. We will followup in 4 months. Blood work was recently done.  Jill Alexanders MD 11/17/2013 9:18 PM  [facility] [address] [place], [place] [address]  Phone [phone] Fax [phone]

## 2013-11-15 NOTE — Patient Instructions (Signed)
Myasthenia Gravis Myasthenia gravis is a disease that causes muscle weakness throughout the body. The muscles affected are the ones we can control (voluntary muscles). An example of a voluntary muscle is your hand muscles. You can control the muscles to make the hand pick something up. An example of an involuntary muscle is the heart. The heart beats without any direction from you.  Myasthenia Gravis is thought to be an autoimmune disease. That means that normal defenses of the body begin to attack the body. In this case, the immune system begins to attack cells located at the junctions of the muscles and the nerves. Women are affected more often. Women are affected at a younger age than men. Babies born to affected women frequently develop symptoms at an early age. SYMPTOMS Initially in the disease, the facial muscles are affected first. After this, a person may develop droopy eyelids. They may have difficulty controlling facial muscles. They may have problems chewing. Swallowing and speaking may become impaired. The weakness gradually spreads to the arms and legs. It begins to affect breathing. Sometimes, the symptoms lessen or go away without any apparent cause. DIAGNOSIS  Diagnosis can be made with blood tests. Tests such as electromyography may be done to examine the electrical activity in the muscle. An improvement in symptoms after having an anti-cholinesterase drug helps confirm the diagnosis.  TREATMENT  Medicines are usually prescribed as the first treatment. These medicines help, but they do not cure the disease. A plasma cleansing procedure (plasmapheresis) can be used to treat a crisis. It can also be used to prepare a person for surgery. This procedure produces short-term improvement. Some cases are helped by removing the thymus gland. Steroids are used for short-term benefits. Document Released: 08/22/2000 Document Revised: 08/08/2011 Document Reviewed: 05/16/2005 [facility] Patient  Information 2015 [place], [place]. This information is not intended to replace advice given to you by your health care provider. Make sure you discuss any questions you have with your health care provider.

## 2013-11-18 LAB — TESTOSTERONE: TESTOSTERONE: 376 ng/dL (ref 348–1197)

## 2013-11-18 LAB — VITAMIN D 1,25 DIHYDROXY: Vit D, 1,25-Dihydroxy: 25 pg/mL (ref 19.9–79.3)

## 2013-11-26 ENCOUNTER — Encounter: Payer: Self-pay | Admitting: Neurology

## 2013-11-28 LAB — CBC WITH DIFFERENTIAL
BASOS ABS: 0 10*3/uL (ref 0.0–0.2)
Basos: 0 %
Eos: 1 %
Eosinophils Absolute: 0.1 10*3/uL (ref 0.0–0.4)
HEMATOCRIT: 39.2 % (ref 37.5–51.0)
Hemoglobin: 12.8 g/dL (ref 12.6–17.7)
IMMATURE GRANULOCYTES: 0 %
Immature Grans (Abs): 0 10*3/uL (ref 0.0–0.1)
Lymphocytes Absolute: 1.2 10*3/uL (ref 0.7–3.1)
Lymphs: 12 %
MCH: 31.3 pg (ref 26.6–33.0)
MCHC: 32.7 g/dL (ref 31.5–35.7)
MCV: 96 fL (ref 79–97)
MONOS ABS: 0.3 10*3/uL (ref 0.1–0.9)
Monocytes: 3 %
NEUTROS ABS: 8.1 10*3/uL — AB (ref 1.4–7.0)
Neutrophils Relative %: 84 %
PLATELETS: 204 10*3/uL (ref 150–379)
RBC: 4.09 x10E6/uL — ABNORMAL LOW (ref 4.14–5.80)
RDW: 13.5 % (ref 12.3–15.4)
WBC: 9.6 10*3/uL (ref 3.4–10.8)

## 2013-11-28 LAB — COMPREHENSIVE METABOLIC PANEL
ALBUMIN: 4.3 g/dL (ref 3.5–4.8)
ALT: 17 IU/L (ref 0–44)
AST: 15 IU/L (ref 0–40)
Albumin/Globulin Ratio: 2.2 (ref 1.1–2.5)
Alkaline Phosphatase: 53 IU/L (ref 39–117)
BILIRUBIN TOTAL: 0.5 mg/dL (ref 0.0–1.2)
BUN/Creatinine Ratio: 8 — ABNORMAL LOW (ref 10–22)
BUN: 49 mg/dL — AB (ref 8–27)
CHLORIDE: 100 mmol/L (ref 97–108)
CO2: 23 mmol/L (ref 18–29)
CREATININE: 6.44 mg/dL — AB (ref 0.76–1.27)
Calcium: 10 mg/dL (ref 8.6–10.2)
GFR calc Af Amer: 9 mL/min/{1.73_m2} — ABNORMAL LOW (ref >59)
GFR calc non Af Amer: 8 mL/min/{1.73_m2} — ABNORMAL LOW (ref >59)
GLOBULIN, TOTAL: 2 g/dL (ref 1.5–4.5)
Glucose: 134 mg/dL — ABNORMAL HIGH (ref 65–99)
Potassium: 5.7 mmol/L — ABNORMAL HIGH (ref 3.5–5.2)
SODIUM: 144 mmol/L (ref 134–144)
Total Protein: 6.3 g/dL (ref 6.0–8.5)

## 2013-11-28 LAB — SEDIMENTATION RATE: Sed Rate: 2 mm/hr (ref 0–30)

## 2013-11-28 LAB — ANA W/REFLEX: ANA: NEGATIVE

## 2013-11-28 LAB — LYME, TOTAL AB TEST/REFLEX: Lyme IgG/IgM Ab: <0.91 {ISR} (ref 0.00–0.90)

## 2013-11-28 LAB — RHEUMATOID FACTOR: Rhuematoid fact SerPl-aCnc: <7 IU/mL (ref 0.0–13.9)

## 2013-11-28 LAB — VITAMIN B12: VITAMIN B 12: 667 pg/mL (ref 211–946)

## 2013-12-02 ENCOUNTER — Other Ambulatory Visit: Payer: Self-pay | Admitting: Neurology

## 2013-12-03 ENCOUNTER — Other Ambulatory Visit: Payer: Self-pay | Admitting: Neurology

## 2013-12-06 ENCOUNTER — Other Ambulatory Visit: Payer: Self-pay | Admitting: Family Medicine

## 2013-12-10 ENCOUNTER — Telehealth: Payer: Self-pay | Admitting: Neurology

## 2013-12-10 NOTE — Telephone Encounter (Signed)
I called patient. Dr. Mercy Moore called earlier concerning this patient. The patient had a drop in his hemoglobin down to the 8 range, and he is concerned that it may be related to the CellCept. We will stop the CellCept at this time, and may consider adding a medication such as cyclosporine taking up to 6 mg per kilogram per day in divided doses. IVIG can be used as an alternative. Rituximab may also be considered. We will follow the hemoglobin levels on this patient.

## 2013-12-18 ENCOUNTER — Telehealth: Payer: Self-pay | Admitting: Neurology

## 2013-12-18 NOTE — Telephone Encounter (Signed)
Dr. Trudie Reed contacted our office. 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 He has reduced to the prednisone, and the arthralgias are returning. The patient has come off of the CellCept secondary to a drop in his hemoglobin. At this point, we may plan on using methotrexate for the rheumatoid arthritis, and continue the patient on trazodone for the myasthenia gravis. It is possible in the future that other medication such as rituximab could be used for the room for arthritis and for the myasthenia. Dr. Mercy Moore is following a hemoglobin, it had dropped to 8.0, trying to get it over 9.

## 2013-12-30 ENCOUNTER — Encounter: Payer: Self-pay | Admitting: Neurology

## 2013-12-30 ENCOUNTER — Ambulatory Visit (INDEPENDENT_AMBULATORY_CARE_PROVIDER_SITE_OTHER): Payer: Medicare Other | Admitting: Neurology

## 2013-12-30 VITALS — BP 113/65 | HR 75 | Wt 190.0 lb

## 2013-12-30 DIAGNOSIS — G7000 Myasthenia gravis without (acute) exacerbation: Secondary | ICD-10-CM

## 2013-12-30 MED ORDER — PREDNISONE 5 MG PO TABS: 15.0000 mg | ORAL_TABLET | Freq: Every day | ORAL | Status: DC

## 2013-12-30 NOTE — Progress Notes (Signed)
Reason for visit: Myasthenia gravis  Adam Walsh is an 73 y.o. male  History of present illness:  Adam Walsh is a 73 year old right-handed white male with a history of myasthenia gravis. 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 The patient was taken off of CellCept as his hemoglobin has dropped down below 8.0. This was noted previously in December of 2014, and the patient was taken off of the CellCept until March of 2015. His hemoglobin seemed to rebound off of CellCept, and this medication may be the sole reason for the anemia. The patient has been seen by Dr. 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 The patient currently complains of left shoulder and left neck discomfort. He has pain with elevation of the left arm. The patient denies any difficulty with ptosis or double vision, or difficulty with chewing or swallowing. He returns for an evaluation.  Past Medical History  Diagnosis Date  . Renal insufficiency   . Hemodialysis patient     Monday, Wednesday and Friday  . HIT (heparin-induced thrombocytopenia)   . Myasthenia gravis   . Hypertension   . History of pyelonephritis   . History of nephrolithiasis   . Celiac sprue   . Hearing deficit     left hearing aid  . Neuropathy     Peripheral neuropathy  . Pancreatic cyst     Past Surgical History  Procedure Laterality Date  . Exploratory laparotomy    . Transurethral resection of prostate    . Cholecystectomy    . Av fistula placement, radiocephalic  62/95/28    Left  . Basilic vein transposition  10/06/10    2nd stage Left arm  . Hematoma evacuation  11/23/10    Left Arm     Family History  Problem Relation Age of Onset  . Stroke Mother   . Diabetes Mother   . Heart disease Brother   . Dementia Sister     Social history:  reports that he quit smoking about 44 years ago. His smoking use  included Cigarettes. He smoked 0.00 packs per day. He has never used smokeless tobacco. He reports that he does not drink alcohol or use illicit drugs.    Allergies  Allergen Reactions  . Avelox [Moxifloxacin Hcl In Nacl]     Pt quit breathing  . Gluten   . Gluten Meal Anaphylaxis  . Ciprofloxacin Other (See Comments)    myasthenia  . Erythromycin Other (See Comments)    myasthenia  . Erythromycin Base Other (See Comments)    Myasthenia gravis  . Gemifloxacin Other (See Comments)    myasthenia  . Gentamicin Other (See Comments)    myasthenia  . Heparin Other (See Comments)    myasthenia  . Kanamycin Other (See Comments)    myasthenia  . Levofloxacin Other (See Comments)    myasthenia  . 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 Neomycin Other (See Comments)    myasthenia  . Norfloxacin Other (See Comments)    myasthenia  . Quinolones Other (See Comments)    Myasthenia gravis  . Streptomycin Other (See Comments)    myasthenia  . Tobramycin Other (See Comments)    myasthenia    Medications:  Current Outpatient Prescriptions on File Prior to Visit  Medication Sig Dispense Refill  . aspirin 81  MG tablet Take 81 mg by mouth daily.       . B Complex-C-Folic Acid (DIALYVITE 161 PO) Take 1 tablet by mouth daily.      . calcium acetate (PHOSLO) 667 MG capsule Take 1,334-2,001 mg by mouth 3 (three) times daily with meals. Takes 2 capsules with snacks and 3 capsules with meals      . clonazePAM (KLONOPIN) 0.5 MG tablet Take 0.5 mg by mouth daily as needed.      . clotrimazole (MYCELEX) 10 MG troche Take 1 tablet (10 mg total) by mouth 5 (five) times daily. Use for 14 days  70 tablet  0  . EPOETIN ALFA IJ Inject 0.1 mLs as directed once a week.       . folic acid-vitamin b complex-vitamin c-selenium-zinc (DIALYVITE) 3 MG TABS tablet Take 30 mg by mouth daily.      Marland Kitchen gabapentin (NEURONTIN) 300 MG capsule Take 300 mg by  mouth at bedtime as needed. For feet/nerve pain      . omeprazole (PRILOSEC) 20 MG capsule Take 20 mg by mouth daily.      . paricalcitol (ZEMPLAR) 2 MCG/ML injection Inject 1 mL into the vein 3 (three) times a week.       . temazepam (RESTORIL) 30 MG capsule Take 1 capsule (30 mg total) by mouth at bedtime as needed for sleep.  30 capsule  0  . zolpidem (AMBIEN) 10 MG tablet Take 1 tablet (10 mg total) by mouth at bedtime as needed for sleep.  20 tablet  1   No current facility-administered medications on file prior to visit.    ROS:  Out of a complete 14 system review of symptoms, the patient complains only of the following symptoms, and all other reviewed systems are negative.  Hearing loss, ringing in the ears Eye itching, eye discharge Cold intolerance Restless legs Food allergies Frequency of urination Joint pain, achy muscles, muscle cramps, walking difficulties, neck pain, neck stiffness Bruising easily Dizziness, headache, weakness Agitation  Blood pressure 113/65, pulse 75, weight 190 lb (86.183 kg).  Physical Exam  General: The patient is alert and cooperative at the time of the examination.  Skin: No significant peripheral edema is noted.   Neurologic Exam  Mental status: The patient is oriented x 3.  Cranial nerves: Facial symmetry is present. Speech is normal, no aphasia or dysarthria is noted. Extraocular movements are full. Visual fields are full. The patient has good strength with the facial muscles, and with the jaw muscles. He has good strength with head turning and shoulder shrug bilaterally. With superior gaze for 1 minute, no divergence of gaze, no subjective double vision or ptosis is seen.  Motor: The patient has good strength in all 4 extremities. The patient has difficulty with abduction of the left arm secondary to left shoulder pain.  Sensory examination: Soft touch sensation is symmetric on the face, arms, and legs.  Coordination: The patient  has good finger-nose-finger and heel-to-shin bilaterally.  Gait and station: The patient has a normal gait. Tandem gait is slightly unsteady. Romberg is negative. No drift is seen.  Reflexes: Deep tendon reflexes are symmetric.   Assessment/Plan:  One. Myasthenia gravis  2. Arthralgias, possible rheumatoid arthritis  The patient continues to be followed by Dr. Trudie Reed, and he may end up going on methotrexate for his arthralgias in the near future. The patient seems to be doing quite well with his myasthenia gravis at this time. He will remain on prednisone taking  15 mg daily, but he may be able to come down on the dose some once the methotrexate is started. The patient is doing well with his myasthenic symptoms at this time. He will followup in about 4 months. Dr. Mercy Moore continues to follow the hemoglobin level.  Jill Alexanders MD 12/30/2013 8:05 PM  [facility] [address] [place], [place] [address]  Phone [phone] Fax [phone]

## 2013-12-30 NOTE — Patient Instructions (Signed)
Myasthenia Gravis Myasthenia gravis is a disease that causes muscle weakness throughout the body. The muscles affected are the ones we can control (voluntary muscles). An example of a voluntary muscle is your hand muscles. You can control the muscles to make the hand pick something up. An example of an involuntary muscle is the heart. The heart beats without any direction from you.  Myasthenia Gravis is thought to be an autoimmune disease. That means that normal defenses of the body begin to attack the body. In this case, the immune system begins to attack cells located at the junctions of the muscles and the nerves. Women are affected more often. Women are affected at a younger age than men. Babies born to affected women frequently develop symptoms at an early age. SYMPTOMS Initially in the disease, the facial muscles are affected first. After this, a person may develop droopy eyelids. They may have difficulty controlling facial muscles. They may have problems chewing. Swallowing and speaking may become impaired. The weakness gradually spreads to the arms and legs. It begins to affect breathing. Sometimes, the symptoms lessen or go away without any apparent cause. DIAGNOSIS  Diagnosis can be made with blood tests. Tests such as electromyography may be done to examine the electrical activity in the muscle. An improvement in symptoms after having an anti-cholinesterase drug helps confirm the diagnosis.  TREATMENT  Medicines are usually prescribed as the first treatment. These medicines help, but they do not cure the disease. A plasma cleansing procedure (plasmapheresis) can be used to treat a crisis. It can also be used to prepare a person for surgery. This procedure produces short-term improvement. Some cases are helped by removing the thymus gland. Steroids are used for short-term benefits. Document Released: 08/22/2000 Document Revised: 08/08/2011 Document Reviewed: 07/17/2013 [employer] Patient  Information 2015 [employer]. This information is not intended to replace advice given to you by your health care provider. Make sure you discuss any questions you have with your health care provider.  

## 2014-02-04 ENCOUNTER — Other Ambulatory Visit: Payer: Self-pay | Admitting: Urology

## 2014-02-04 DIAGNOSIS — D4959 Neoplasm of unspecified behavior of other genitourinary organ: Secondary | ICD-10-CM

## 2014-02-08 ENCOUNTER — Encounter (HOSPITAL_COMMUNITY): Payer: Self-pay | Admitting: Emergency Medicine

## 2014-02-08 ENCOUNTER — Inpatient Hospital Stay (HOSPITAL_COMMUNITY)
Admission: EM | Admit: 2014-02-08 | Discharge: 2014-02-16 | DRG: 871 | Disposition: A | Discharge status: Home Health | Payer: Medicare Other | Attending: Internal Medicine | Admitting: Internal Medicine

## 2014-02-08 ENCOUNTER — Emergency Department (HOSPITAL_COMMUNITY): Payer: Medicare Other

## 2014-02-08 DIAGNOSIS — A4101 Sepsis due to Methicillin susceptible Staphylococcus aureus: Secondary | ICD-10-CM | POA: Diagnosis present

## 2014-02-08 DIAGNOSIS — Z01810 Encounter for preprocedural cardiovascular examination: Secondary | ICD-10-CM

## 2014-02-08 DIAGNOSIS — Z888 Allergy status to other drugs, medicaments and biological substances status: Secondary | ICD-10-CM

## 2014-02-08 DIAGNOSIS — Z7982 Long term (current) use of aspirin: Secondary | ICD-10-CM | POA: Diagnosis not present

## 2014-02-08 DIAGNOSIS — M899 Disorder of bone, unspecified: Secondary | ICD-10-CM | POA: Diagnosis present

## 2014-02-08 DIAGNOSIS — A419 Sepsis, unspecified organism: Secondary | ICD-10-CM | POA: Diagnosis present

## 2014-02-08 DIAGNOSIS — Z66 Do not resuscitate: Secondary | ICD-10-CM | POA: Diagnosis present

## 2014-02-08 DIAGNOSIS — D696 Thrombocytopenia, unspecified: Secondary | ICD-10-CM | POA: Diagnosis present

## 2014-02-08 DIAGNOSIS — N2581 Secondary hyperparathyroidism of renal origin: Secondary | ICD-10-CM | POA: Diagnosis present

## 2014-02-08 DIAGNOSIS — Z833 Family history of diabetes mellitus: Secondary | ICD-10-CM

## 2014-02-08 DIAGNOSIS — G7001 Myasthenia gravis with (acute) exacerbation: Secondary | ICD-10-CM | POA: Diagnosis present

## 2014-02-08 DIAGNOSIS — K900 Celiac disease: Secondary | ICD-10-CM | POA: Diagnosis present

## 2014-02-08 DIAGNOSIS — N186 End stage renal disease: Secondary | ICD-10-CM | POA: Diagnosis present

## 2014-02-08 DIAGNOSIS — D75829 Heparin-induced thrombocytopenia, unspecified: Secondary | ICD-10-CM | POA: Diagnosis present

## 2014-02-08 DIAGNOSIS — D61818 Other pancytopenia: Secondary | ICD-10-CM | POA: Diagnosis not present

## 2014-02-08 DIAGNOSIS — M069 Rheumatoid arthritis, unspecified: Secondary | ICD-10-CM | POA: Diagnosis present

## 2014-02-08 DIAGNOSIS — D539 Nutritional anemia, unspecified: Secondary | ICD-10-CM | POA: Diagnosis present

## 2014-02-08 DIAGNOSIS — Z79899 Other long term (current) drug therapy: Secondary | ICD-10-CM

## 2014-02-08 DIAGNOSIS — D7582 Heparin induced thrombocytopenia (HIT): Secondary | ICD-10-CM | POA: Diagnosis present

## 2014-02-08 DIAGNOSIS — Z992 Dependence on renal dialysis: Secondary | ICD-10-CM | POA: Diagnosis not present

## 2014-02-08 DIAGNOSIS — Z823 Family history of stroke: Secondary | ICD-10-CM | POA: Diagnosis not present

## 2014-02-08 DIAGNOSIS — I10 Essential (primary) hypertension: Secondary | ICD-10-CM | POA: Diagnosis present

## 2014-02-08 DIAGNOSIS — T82898A Other specified complication of vascular prosthetic devices, implants and grafts, initial encounter: Secondary | ICD-10-CM

## 2014-02-08 DIAGNOSIS — B9561 Methicillin susceptible Staphylococcus aureus infection as the cause of diseases classified elsewhere: Secondary | ICD-10-CM | POA: Diagnosis present

## 2014-02-08 DIAGNOSIS — Z87442 Personal history of urinary calculi: Secondary | ICD-10-CM

## 2014-02-08 DIAGNOSIS — R7881 Bacteremia: Secondary | ICD-10-CM | POA: Diagnosis present

## 2014-02-08 DIAGNOSIS — IMO0002 Reserved for concepts with insufficient information to code with codable children: Secondary | ICD-10-CM | POA: Diagnosis present

## 2014-02-08 DIAGNOSIS — Z8249 Family history of ischemic heart disease and other diseases of the circulatory system: Secondary | ICD-10-CM

## 2014-02-08 DIAGNOSIS — L03114 Cellulitis of left upper limb: Secondary | ICD-10-CM

## 2014-02-08 DIAGNOSIS — I120 Hypertensive chronic kidney disease with stage 5 chronic kidney disease or end stage renal disease: Secondary | ICD-10-CM | POA: Diagnosis present

## 2014-02-08 DIAGNOSIS — R52 Pain, unspecified: Secondary | ICD-10-CM | POA: Diagnosis present

## 2014-02-08 DIAGNOSIS — Z87891 Personal history of nicotine dependence: Secondary | ICD-10-CM

## 2014-02-08 DIAGNOSIS — I471 Supraventricular tachycardia, unspecified: Secondary | ICD-10-CM | POA: Diagnosis present

## 2014-02-08 DIAGNOSIS — I5032 Chronic diastolic (congestive) heart failure: Secondary | ICD-10-CM | POA: Diagnosis present

## 2014-02-08 DIAGNOSIS — Z23 Encounter for immunization: Secondary | ICD-10-CM | POA: Diagnosis not present

## 2014-02-08 DIAGNOSIS — I509 Heart failure, unspecified: Secondary | ICD-10-CM | POA: Diagnosis present

## 2014-02-08 DIAGNOSIS — G609 Hereditary and idiopathic neuropathy, unspecified: Secondary | ICD-10-CM | POA: Diagnosis present

## 2014-02-08 DIAGNOSIS — M62830 Muscle spasm of back: Secondary | ICD-10-CM | POA: Diagnosis not present

## 2014-02-08 DIAGNOSIS — Z881 Allergy status to other antibiotic agents status: Secondary | ICD-10-CM | POA: Diagnosis not present

## 2014-02-08 DIAGNOSIS — Z91018 Allergy to other foods: Secondary | ICD-10-CM | POA: Diagnosis not present

## 2014-02-08 DIAGNOSIS — G7000 Myasthenia gravis without (acute) exacerbation: Secondary | ICD-10-CM | POA: Diagnosis present

## 2014-02-08 DIAGNOSIS — Z87448 Personal history of other diseases of urinary system: Secondary | ICD-10-CM

## 2014-02-08 DIAGNOSIS — M949 Disorder of cartilage, unspecified: Secondary | ICD-10-CM

## 2014-02-08 LAB — URINALYSIS, ROUTINE W REFLEX MICROSCOPIC
BILIRUBIN URINE: NEGATIVE
Glucose, UA: 250 mg/dL — AB
Ketones, ur: NEGATIVE mg/dL
Leukocytes, UA: NEGATIVE
Nitrite: NEGATIVE
PH: 8 (ref 5.0–8.0)
Protein, ur: 30 mg/dL — AB
SPECIFIC GRAVITY, URINE: 1.01 (ref 1.005–1.030)
UROBILINOGEN UA: 0.2 mg/dL (ref 0.0–1.0)

## 2014-02-08 LAB — COMPREHENSIVE METABOLIC PANEL
ALBUMIN: 2.9 g/dL — AB (ref 3.5–5.2)
ALK PHOS: 56 U/L (ref 39–117)
ALT: 16 U/L (ref 0–53)
ANION GAP: 14 (ref 5–15)
AST: 18 U/L (ref 0–37)
BILIRUBIN TOTAL: 0.6 mg/dL (ref 0.3–1.2)
BUN: 45 mg/dL — ABNORMAL HIGH (ref 6–23)
CO2: 25 mEq/L (ref 19–32)
Calcium: 9.4 mg/dL (ref 8.4–10.5)
Chloride: 100 mEq/L (ref 96–112)
Creatinine, Ser: 5.52 mg/dL — ABNORMAL HIGH (ref 0.50–1.35)
GFR calc Af Amer: 11 mL/min — ABNORMAL LOW (ref >90)
GFR calc non Af Amer: 9 mL/min — ABNORMAL LOW (ref >90)
Glucose, Bld: 101 mg/dL — ABNORMAL HIGH (ref 70–99)
POTASSIUM: 4.2 meq/L (ref 3.7–5.3)
Sodium: 139 mEq/L (ref 137–147)
TOTAL PROTEIN: 6 g/dL (ref 6.0–8.3)

## 2014-02-08 LAB — CBC WITH DIFFERENTIAL/PLATELET
BASOS PCT: 0 % (ref 0–1)
Basophils Absolute: 0 10*3/uL (ref 0.0–0.1)
Eosinophils Absolute: 0.1 10*3/uL (ref 0.0–0.7)
Eosinophils Relative: 1 % (ref 0–5)
HCT: 39.6 % (ref 39.0–52.0)
HEMOGLOBIN: 12.5 g/dL — AB (ref 13.0–17.0)
Lymphocytes Relative: 7 % — ABNORMAL LOW (ref 12–46)
Lymphs Abs: 0.8 10*3/uL (ref 0.7–4.0)
MCH: 32.1 pg (ref 26.0–34.0)
MCHC: 31.6 g/dL (ref 30.0–36.0)
MCV: 101.8 fL — ABNORMAL HIGH (ref 78.0–100.0)
Monocytes Absolute: 0.1 10*3/uL (ref 0.1–1.0)
Monocytes Relative: 1 % — ABNORMAL LOW (ref 3–12)
NEUTROS ABS: 10 10*3/uL — AB (ref 1.7–7.7)
Neutrophils Relative %: 91 % — ABNORMAL HIGH (ref 43–77)
Platelets: 102 10*3/uL — ABNORMAL LOW (ref 150–400)
RBC: 3.89 MIL/uL — ABNORMAL LOW (ref 4.22–5.81)
RDW: 15.2 % (ref 11.5–15.5)
WBC: 11 10*3/uL — ABNORMAL HIGH (ref 4.0–10.5)

## 2014-02-08 LAB — I-STAT CG4 LACTIC ACID, ED: Lactic Acid, Venous: 1.38 mmol/L (ref 0.5–2.2)

## 2014-02-08 LAB — URINE MICROSCOPIC-ADD ON

## 2014-02-08 MED ORDER — ACETAMINOPHEN 325 MG PO TABS
650.0000 mg | ORAL_TABLET | Freq: Four times a day (QID) | ORAL | Status: DC | PRN
  Administered 2014-02-08 – 2014-02-09 (×3): 650 mg via ORAL
  Filled 2014-02-08 (×3): qty 2

## 2014-02-08 MED ORDER — SODIUM CHLORIDE 0.9 % IJ SOLN
3.0000 mL | INTRAMUSCULAR | Status: DC | PRN
  Administered 2014-02-08 – 2014-02-09 (×2): 3 mL via INTRAVENOUS

## 2014-02-08 MED ORDER — VANCOMYCIN HCL IN DEXTROSE 1-5 GM/200ML-% IV SOLN
1000.0000 mg | Freq: Once | INTRAVENOUS | Status: AC
  Administered 2014-02-08: 1000 mg via INTRAVENOUS
  Filled 2014-02-08: qty 200

## 2014-02-08 MED ORDER — ALUM & MAG HYDROXIDE-SIMETH 200-200-20 MG/5ML PO SUSP: 30.0000 mL | Freq: Four times a day (QID) | ORAL | Status: DC | PRN

## 2014-02-08 MED ORDER — PIPERACILLIN-TAZOBACTAM IN DEX 2-0.25 GM/50ML IV SOLN
2.2500 g | Freq: Once | INTRAVENOUS | Status: AC
  Administered 2014-02-08: 2.25 g via INTRAVENOUS
  Filled 2014-02-08: qty 50

## 2014-02-08 MED ORDER — SODIUM CHLORIDE 0.9 % IV SOLN: 250.0000 mL | INTRAVENOUS | Status: DC | PRN

## 2014-02-08 MED ORDER — ONDANSETRON HCL 4 MG PO TABS: 4.0000 mg | ORAL_TABLET | Freq: Four times a day (QID) | ORAL | Status: DC | PRN

## 2014-02-08 MED ORDER — FOLIC ACID 1 MG PO TABS
1.0000 mg | ORAL_TABLET | Freq: Every day | ORAL | Status: DC
  Administered 2014-02-08 – 2014-02-16 (×9): 1 mg via ORAL
  Filled 2014-02-08 (×9): qty 1

## 2014-02-08 MED ORDER — INFLUENZA VAC SPLIT QUAD 0.5 ML IM SUSY
0.5000 mL | PREFILLED_SYRINGE | INTRAMUSCULAR | Status: DC
  Filled 2014-02-08: qty 0.5

## 2014-02-08 MED ORDER — ACETAMINOPHEN 325 MG PO TABS
650.0000 mg | ORAL_TABLET | Freq: Once | ORAL | Status: AC
  Administered 2014-02-08: 650 mg via ORAL
  Filled 2014-02-08: qty 2

## 2014-02-08 MED ORDER — PREDNISONE 5 MG PO TABS
15.0000 mg | ORAL_TABLET | Freq: Every day | ORAL | Status: DC
  Administered 2014-02-09 – 2014-02-16 (×8): 15 mg via ORAL
  Filled 2014-02-08 (×11): qty 1

## 2014-02-08 MED ORDER — CALCIUM ACETATE 667 MG PO CAPS
1334.0000 mg | ORAL_CAPSULE | Freq: Every day | ORAL | Status: DC
  Administered 2014-02-08 – 2014-02-16 (×25): 1334 mg via ORAL
  Filled 2014-02-08 (×44): qty 2

## 2014-02-08 MED ORDER — GABAPENTIN 300 MG PO CAPS
300.0000 mg | ORAL_CAPSULE | Freq: Every day | ORAL | Status: DC
  Administered 2014-02-08 – 2014-02-15 (×8): 300 mg via ORAL
  Filled 2014-02-08 (×10): qty 1

## 2014-02-08 MED ORDER — ONDANSETRON HCL 4 MG/2ML IJ SOLN: 4.0000 mg | Freq: Four times a day (QID) | INTRAMUSCULAR | Status: DC | PRN

## 2014-02-08 MED ORDER — SODIUM CHLORIDE 0.9 % IJ SOLN
3.0000 mL | Freq: Two times a day (BID) | INTRAMUSCULAR | Status: DC
  Administered 2014-02-08 – 2014-02-16 (×16): 3 mL via INTRAVENOUS

## 2014-02-08 MED ORDER — PANTOPRAZOLE SODIUM 40 MG PO TBEC
40.0000 mg | DELAYED_RELEASE_TABLET | Freq: Every day | ORAL | Status: DC
  Administered 2014-02-08 – 2014-02-16 (×9): 40 mg via ORAL
  Filled 2014-02-08 (×8): qty 1

## 2014-02-08 MED ORDER — ACETAMINOPHEN 650 MG RE SUPP: 650.0000 mg | Freq: Four times a day (QID) | RECTAL | Status: DC | PRN

## 2014-02-08 NOTE — ED Notes (Signed)
Patient arrives with complaint of generalized body pain and possible infection of dialysis graft in left arm. States that his arm started to hurt yesterday and now he "feels terrible". Explains that he received dialysis yesterday and since that time it hasn't been right. AV graft in left arm appears swollen and red, feels hot, and is tender. Prominent bruit noted proximal to swelling and redness. Wife states that patient has felt terrible all night, explains that he was bundled up in a blanket on the couch, and has not been able to do things as normal because he is weak.

## 2014-02-08 NOTE — Progress Notes (Signed)
Fever on 102.9. Tylenol given per [place]. Md made aware. No new order. Graft site warm to touch. Will monitor patient.

## 2014-02-08 NOTE — Consult Note (Addendum)
CONSULT NOTE   MRN : [id number]  Reason for Consult: Left AV fistula infection  Referring Physician: ED  History of Present Illness: 73 y/o male with generalized weakness and body aches.  States he just hurts all over.  Yesterday after dialysis developed HA and stated he hurt all over.  Noticed large red swollen area over fistula stick site.  He was brought to the ED for evaluation of possible av fistula infection.  The left AV fistula was created in 2012 by Dr. Bridgett Larsson and has done fine until now.  He does home dialysis 4 days a week foe 2.5 hours at a time.  Past medical history includes:Myasthenia gravis, HIT, CKF, Neuropathy, and Hypertension stable.  He does not have DM or hypercholesterolemia.  He has celiac sprue and needs a gluten free diet.    Current Facility-Administered Medications  Medication Dose Route Frequency Provider Last Rate Last Dose  . piperacillin-tazobactam (ZOSYN) IVPB 2.25 g  2.25 g Intravenous Once Britt Bottom, NP       Current Outpatient Prescriptions  Medication Sig Dispense Refill  . aspirin 81 MG tablet Take 81 mg by mouth daily.       . B Complex-C-Folic Acid (DIALYVITE 009 PO) Take 1 tablet by mouth daily.      . calcium acetate (PHOSLO) 667 MG capsule Take 1,334 mg by mouth 5 (five) times daily. With each meal and snack.      . clonazePAM (KLONOPIN) 0.5 MG tablet Take 0.5 mg by mouth daily as needed.      . EPOETIN ALFA IJ Inject 11,000 Units as directed once a week.       . folic acid (FOLVITE) 1 MG tablet Take 1 mg by mouth daily.      Marland Kitchen gabapentin (NEURONTIN) 300 MG capsule Take 300 mg by mouth at bedtime as needed. For feet/nerve pain      . methotrexate (RHEUMATREX) 2.5 MG tablet Take 10 mg by mouth once a week. Caution:Chemotherapy. Protect from light.Take on Thursday.      Marland Kitchen omeprazole (PRILOSEC) 20 MG capsule Take 20 mg by mouth daily.      . paricalcitol (ZEMPLAR) 5 MCG/ML injection Inject 2 mcg into the vein every Monday, Wednesday, and  Friday. In home dialysis      . predniSONE (DELTASONE) 5 MG tablet Take 3 tablets (15 mg total) by mouth daily with breakfast.  90 tablet  5    Pt meds include: Statin :No Betablocker: No ASA: Yes Other anticoagulants/antiplatelets: None  Past Medical History  Diagnosis Date  . Renal insufficiency   . Hemodialysis patient     Monday, Wednesday and Friday  . HIT (heparin-induced thrombocytopenia)   . Myasthenia gravis   . Hypertension   . History of pyelonephritis   . History of nephrolithiasis   . Celiac sprue   . Hearing deficit     left hearing aid  . Neuropathy     Peripheral neuropathy  . Pancreatic cyst     Past Surgical History  Procedure Laterality Date  . Exploratory laparotomy    . Transurethral resection of prostate    . Cholecystectomy    . Av fistula placement, radiocephalic  38/18/29    Left  . Basilic vein transposition  10/06/10    2nd stage Left arm  . Hematoma evacuation  11/23/10    Left Arm     Social History History  Substance Use Topics  . Smoking status: Former Smoker    Types:  Cigarettes    Quit date: 05/30/1969  . Smokeless tobacco: Never Used  . Alcohol Use: No    Family History Family History  Problem Relation Age of Onset  . Stroke Mother   . Diabetes Mother   . Heart disease Brother   . Dementia Sister     Allergies  Allergen Reactions  . Avelox [Moxifloxacin Hcl In Nacl]     Pt quit breathing  . Gluten   . Gluten Meal Anaphylaxis  . Ciprofloxacin Other (See Comments)    myasthenia  . Erythromycin Other (See Comments)    myasthenia  . Erythromycin Base Other (See Comments)    Myasthenia gravis  . Gemifloxacin Other (See Comments)    myasthenia  . Gentamicin Other (See Comments)    myasthenia  . Heparin Other (See Comments)    myasthenia  . Kanamycin Other (See Comments)    myasthenia  . Levofloxacin Other (See Comments)    myasthenia  . Mestinon [Pyridostigmine] Other (See Comments)    Reacts with his  myasthenia graves disease/ diarrhea  . Neomycin Other (See Comments)    myasthenia  . Norfloxacin Other (See Comments)    myasthenia  . Quinolones Other (See Comments)    Myasthenia gravis  . Streptomycin Other (See Comments)    myasthenia  . Tobramycin Other (See Comments)    myasthenia     REVIEW OF SYSTEMS  General: [ ]  Weight loss, [x ] Fever, [x ] chills Neurologic: [ ]  Dizziness, [ ]  Blackouts, [ ]  Seizure [ ]  Stroke, [ ]  "Mini stroke", [ ]  Slurred speech, [ ]  Temporary blindness; [ ]  weakness in arms or legs, [ ]  Hoarseness [ ]  Dysphagia Cardiac: [ ]  Chest pain/pressure, [ ]  Shortness of breath at rest [ ]  Shortness of breath with exertion, [ ]  Atrial fibrillation or irregular heartbeat  Vascular: [ ]  Pain in legs with walking, [ ]  Pain in legs at rest, [ ]  Pain in legs at night,  [ ]  Non-healing ulcer, [ ]  Blood clot in vein/DVT,   Pulmonary: [ ]  Home oxygen, [ ]  Productive cough, [ ]  Coughing up blood, [ ]  Asthma,  [ ]  Wheezing [ ]  COPD Musculoskeletal:  [ ]  Arthritis, [ ]  Low back pain, [ ]  Joint pain Hematologic: [ ]  Easy Bruising, [ ]  Anemia; [ ]  Hepatitis Gastrointestinal: [ ]  Blood in stool, [ ]  Gastroesophageal Reflux/heartburn, Urinary: [ ]  chronic Kidney disease, [x ] on HD - [ ]  MWF or [ ]  TTHS, [ ]  Burning with urination, [ ]  Difficulty urinating Skin: [ ]  Rashes, [ ]  Wounds Psychological: [ ]  Anxiety, [ ]  Depression  Physical Examination Filed Vitals:   02/08/14 0528 02/08/14 0530 02/08/14 0615 02/08/14 0658  BP: 138/64 155/69 147/63   Pulse: 95 92 91   Temp: 98.4 F (36.9 C)   101.3 F (38.5 C)  TempSrc: Oral   Oral  Resp: 22 13 19    SpO2: 95% 94% 94%    There is no weight on file to calculate BMI.  General:  WDWN in NAD HENT: WNL Eyes: Pupils equal Pulmonary: normal non-labored breathing , without Rales, rhonchi,  wheezing Cardiac: RRR, without  Murmurs, rubs or gallops; No carotid bruits Abdomen: soft, NT, no masses Skin: no rashes, ulcers  noted;  no Gangrene , no cellulitis; no open wounds;   Vascular Exam/Pulses:Palpable thrill left AV fistula distal stick site "button hole" erythema with warmth to touch and edema.  No purulent drainage, no bleeding.  Palpable radial, brachial femoral, DP/PT pulses.  Musculoskeletal: no muscle wasting or atrophy; no edema  Neurologic: A&O X 3; Appropriate Affect ;  SENSATION: normal; MOTOR FUNCTION: 5/5 Symmetric Speech is fluent/normal   Significant Diagnostic Studies: CBC Lab Results  Component Value Date   WBC 11.0* 02/08/2014   HGB 12.5* 02/08/2014   HCT 39.6 02/08/2014   MCV 101.8* 02/08/2014   PLT 102* 02/08/2014    BMET    Component Value Date/Time   NA 139 02/08/2014 0645   NA 144 08/19/2013 1358   K 4.2 02/08/2014 0645   CL 100 02/08/2014 0645   CO2 25 02/08/2014 0645   GLUCOSE 101* 02/08/2014 0645   GLUCOSE 134* 08/19/2013 1358   BUN 45* 02/08/2014 0645   BUN 49* 08/19/2013 1358   CREATININE 5.52* 02/08/2014 0645   CALCIUM 9.4 02/08/2014 0645   GFRNONAA 9* 02/08/2014 0645   GFRAA 11* 02/08/2014 0645   The CrCl is unknown because both a height and weight (above a minimum accepted value) are required for this calculation.  COAG Lab Results  Component Value Date   INR 1.0 05/21/2012   INR 1.24 07/08/2010     Non-Invasive Vascular Imaging: Pending duplex results of Left av fistula  ASSESSMENT/PLAN:  Left AV fistula infection/cellulitis < 24 hours Recommend antibiotic treatment at this time.  If it does not clear up after 24-48 hours he may need a temp dialysis catheter based on Nephrology recommendations. We will follow   Laurence Slate [facility] 02/08/2014 8:46 AM  Addendum  I have independently interviewed and examined the patient, and I agree with the physician assistant's findings.  No fluc. Abscess on exam.  Preliminary duplex of L fistula suggest no fluid collections surrounding the fistula.  Exam is consistent with cellulitis.  Strong thrill in fistula  -  Recommend admission to medical service for IV abx - Depending on how rapidly the cellulitis resolves, the patient may need temporary dialysis catheter placement to avoid sticking through infected skin - will continue to follow with you.  Adele Barthel, MD [facility] Office: [phone] Pager: [phone]  02/08/2014, 9:55 AM

## 2014-02-08 NOTE — ED Provider Notes (Signed)
CSN: [id number]     Arrival date & time 02/08/14  0505 History   First MD Initiated Contact with Patient 02/08/14 0600     Chief Complaint  Patient presents with  . Vascular Access Problem  . Generalized Body Aches   (Consider location/radiation/quality/duration/timing/severity/associated sxs/prior Treatment) HPI ROMULUS HANRAHAN is a 73 yo male with PMH: renal insufficiency requiring at home hemodialysis, presenting to the ED with c/o aching all over and redness and pain at his dialysis access site.  Pt states about 5 days ago he began feeling more fatigued, and yesterday he noticed his left arm became reddened and tender to the touch. He describes the pain as achy and constant and rates it as a 10/10.  He denies fever, chills, nausea, vomiting, chest pain, or shortness of breath.    Past Medical History  Diagnosis Date  . Renal insufficiency   . Hemodialysis patient     Monday, Wednesday and Friday  . HIT (heparin-induced thrombocytopenia)   . Myasthenia gravis   . Hypertension   . History of pyelonephritis   . History of nephrolithiasis   . Celiac sprue   . Hearing deficit     left hearing aid  . Neuropathy     Peripheral neuropathy  . Pancreatic cyst    Past Surgical History  Procedure Laterality Date  . Exploratory laparotomy    . Transurethral resection of prostate    . Cholecystectomy    . Av fistula placement, radiocephalic  02/40/97    Left  . Basilic vein transposition  10/06/10    2nd stage Left arm  . Hematoma evacuation  11/23/10    Left Arm    Family History  Problem Relation Age of Onset  . Stroke Mother   . Diabetes Mother   . Heart disease Brother   . Dementia Sister    History  Substance Use Topics  . Smoking status: Former Smoker    Types: Cigarettes    Quit date: 05/30/1969  . Smokeless tobacco: Never Used  . Alcohol Use: No    Review of Systems  Constitutional: Positive for fatigue. Negative for fever and chills.  HENT: Negative for sore  throat.   Eyes: Negative for visual disturbance.  Respiratory: Negative for cough and shortness of breath.   Cardiovascular: Negative for chest pain and leg swelling.  Gastrointestinal: Negative for nausea, vomiting and diarrhea.  Genitourinary: Negative for dysuria.  Musculoskeletal: Negative for myalgias.  Skin: Positive for color change. Negative for rash.  Neurological: Negative for weakness, numbness and headaches.    Allergies  Avelox; Gluten; Gluten meal; Ciprofloxacin; Erythromycin; Erythromycin base; Gemifloxacin; Gentamicin; Heparin; Kanamycin; Levofloxacin; Mestinon; Neomycin; Norfloxacin; Quinolones; Streptomycin; and Tobramycin  Home Medications   Prior to Admission medications   Medication Sig Start Date End Date Taking? Authorizing Provider  aspirin 81 MG tablet Take 81 mg by mouth daily.    Yes Historical Provider, MD  B Complex-C-Folic Acid (DIALYVITE 353 PO) Take 1 tablet by mouth daily.   Yes Historical Provider, MD  calcium acetate (PHOSLO) 667 MG capsule Take 1,334 mg by mouth 5 (five) times daily. With each meal and snack.   Yes Historical Provider, MD  clonazePAM (KLONOPIN) 0.5 MG tablet Take 0.5 mg by mouth daily as needed. 02/12/13  Yes Historical Provider, MD  EPOETIN ALFA IJ Inject 11,000 Units as directed once a week.    Yes Historical Provider, MD  folic acid (FOLVITE) 1 MG tablet Take 1 mg by mouth daily.  Yes Historical Provider, MD  gabapentin (NEURONTIN) 300 MG capsule Take 300 mg by mouth at bedtime as needed. For feet/nerve pain   Yes Historical Provider, MD  methotrexate (RHEUMATREX) 2.5 MG tablet Take 10 mg by mouth once a week. Caution:Chemotherapy. Protect from light.Take on Thursday.   Yes Historical Provider, MD  omeprazole (PRILOSEC) 20 MG capsule Take 20 mg by mouth daily.   Yes Historical Provider, MD  paricalcitol (ZEMPLAR) 5 MCG/ML injection Inject 2 mcg into the vein every Monday, Wednesday, and Friday. In home dialysis   Yes Historical  Provider, MD  predniSONE (DELTASONE) 5 MG tablet Take 3 tablets (15 mg total) by mouth daily with breakfast. 12/30/13  Yes Kathrynn Ducking, MD   BP 155/69  Pulse 92  Temp(Src) 98.4 F (36.9 C) (Oral)  Resp 13  SpO2 94% Physical Exam  Nursing note and vitals reviewed. Constitutional: He is oriented to person, place, and time. He appears well-developed and well-nourished. No distress.  HENT:  Head: Normocephalic and atraumatic.  Mouth/Throat: Oropharynx is clear and moist. No oropharyngeal exudate.  Eyes: Conjunctivae are normal. Pupils are equal, round, and reactive to light.  Neck: Neck supple. No thyromegaly present.  Cardiovascular: Normal rate, regular rhythm and intact distal pulses.   Pulmonary/Chest: Effort normal and breath sounds normal. No respiratory distress. He has no decreased breath sounds. He has no wheezes. He has no rhonchi. He has no rales. He exhibits no tenderness.  Abdominal: Soft. There is no tenderness.  Musculoskeletal: He exhibits no tenderness.  Lymphadenopathy:    He has no cervical adenopathy.  Neurological: He is alert and oriented to person, place, and time. He has normal strength. No cranial nerve deficit or sensory deficit. GCS eye subscore is 4. GCS verbal subscore is 5. GCS motor subscore is 6.  Skin: Skin is warm and dry. No rash noted. He is not diaphoretic. There is erythema.     Psychiatric: He has a normal mood and affect.    ED Course  Procedures (including critical care time) Labs Review Labs Reviewed  CBC WITH DIFFERENTIAL - Abnormal; Notable for the following:    WBC 11.0 (*)    RBC 3.89 (*)    Hemoglobin 12.5 (*)    MCV 101.8 (*)    Platelets 102 (*)    Neutrophils Relative % 91 (*)    Neutro Abs 10.0 (*)    Lymphocytes Relative 7 (*)    Monocytes Relative 1 (*)    All other components within normal limits  COMPREHENSIVE METABOLIC PANEL - Abnormal; Notable for the following:    Glucose, Bld 101 (*)    BUN 45 (*)    Creatinine,  Ser 5.52 (*)    Albumin 2.9 (*)    GFR calc non Af Amer 9 (*)    GFR calc Af Amer 11 (*)    All other components within normal limits  URINALYSIS, ROUTINE W REFLEX MICROSCOPIC - Abnormal; Notable for the following:    Glucose, UA 250 (*)    Hgb urine dipstick MODERATE (*)    Protein, ur 30 (*)    All other components within normal limits  URINE MICROSCOPIC-ADD ON - Abnormal; Notable for the following:    Squamous Epithelial / LPF FEW (*)    All other components within normal limits  CULTURE, BLOOD (ROUTINE X 2)  CULTURE, BLOOD (ROUTINE X 2)  I-STAT CG4 LACTIC ACID, ED    Imaging Review Dg Chest Port 1 View  02/08/2014   CLINICAL  DATA:  Vascular access problem.  Generalized body aches.  EXAM: PORTABLE CHEST - 1 VIEW  COMPARISON:  None 10/2012  FINDINGS: The heart size and mediastinal contours are within normal limits. Both lungs are clear. Surgical clips near the gastroesophageal junction. The visualized skeletal structures are unremarkable.  IMPRESSION: No active disease.   Electronically Signed   By: Curlene Dolphin M.D.   On: 02/08/2014 07:52     EKG Interpretation None      MDM   Final diagnoses:  Cellulitis of arm, left  End stage renal disease on dialysis  Myasthenia gravis with acute exacerbation  Rheumatoid arthritis  Staphylococcus aureus bacteremia  PSVT (paroxysmal supraventricular tachycardia)   73 yo male with reddened and painful left arm.   Pt febrile to 101.7 on my exam.  Consider cellulitis vs local dialysis access abscess vs sepsis.    CBC: wbc: 11, CMP: show elevated BUN and creatinine and decreased GFR consistent with existing renal failure, Lactic acid: not elevated,  BC x2 and CXR: no active disease, bedside US performed by Dr. Claudine Mouton shows fluid collection.  Official US: no abscess.    Tylenol, Vanc and Zosyn IV  Vascular consult: no surgical intervention.  Will admit to medicine.    Case discussed with Dr. Wyvonnia Dusky.      Filed Vitals:   02/08/14  0745 02/08/14 0815 02/08/14 0830 02/08/14 0845  BP:  108/50 114/55 123/52  Pulse: 87 86 79 76  Temp:      TempSrc:      Resp: 22 26 23  33  SpO2: 96% 97% 98% 97%   Meds given in ED:  Medications  acetaminophen (TYLENOL) tablet 650 mg (650 mg Oral Given 02/08/14 0658)  vancomycin (VANCOCIN) IVPB 1000 mg/200 mL premix (0 mg Intravenous Stopped 02/08/14 0851)  piperacillin-tazobactam (ZOSYN) IVPB 2.25 g (0 g Intravenous Stopped 02/08/14 0932)    New Prescriptions   No medications on file       Britt Bottom, NP 02/11/14 2106

## 2014-02-08 NOTE — H&P (Addendum)
[facility] History and Physical  IVAN LACHER PNT:[id number] DOB: 09/30/1940 DOA: 02/08/2014   PCP: Donnie Coffin, MD    Chief Complaint: generalized weakness and body aches  HPI: Adam Walsh is a 73 y.o. male the old with past medical history of end-stage renal disease on hemodialysis, myasthenia gravis, rheumatoid arthritis, HIT. The patient underwent dialysis yesterday which was uneventful and later in the evening noticed redness around his fistula. By this morning he was having fevers and generalized weakness along with muscle aches and joint pains.he decided to come to the ER where it was noted that his fistula appeared to be infected. He was evaluated with an ultrasound and is negative for an abscess. He is being admitted for this infection. He has been evaluated by vascular surgery   General: The patient denies anorexia, fever, weight loss Cardiac: Denies chest pain, syncope, palpitations, pedal edema  Respiratory: Denies cough, shortness of breath, wheezing GI: Denies severe indigestion/heartburn, abdominal pain, nausea, vomiting, diarrhea and constipation GU: Denies hematuria, incontinence, dysuria  Musculoskeletal: Denies arthritis but has been having generalized muscle and joint pains Skin: Denies suspicious skin lesions Neurologic: Denies focal weakness or numbness, change in vision-has generalized weakness in arms and legs  Past Medical History  Diagnosis Date  . Hemodialysis patient     Monday, Wednesday and Friday  . HIT (heparin-induced thrombocytopenia)   . Myasthenia gravis   . Hypertension   . History of pyelonephritis   . History of nephrolithiasis   . Celiac sprue   . Hearing deficit     left hearing aid  . Neuropathy     Peripheral neuropathy  . Pancreatic cyst     Past Surgical History  Procedure Laterality Date  . Exploratory laparotomy    . Transurethral resection of prostate    . Cholecystectomy    . Av fistula placement,  radiocephalic  08/67/61    Left  . Basilic vein transposition  10/06/10    2nd stage Left arm  . Hematoma evacuation  11/23/10    Left Arm     Social History: non-smoker and does not drink alcohol Lives at home with wife    Allergies  Allergen Reactions  . Avelox [Moxifloxacin Hcl In Nacl]     Pt quit breathing  . Gluten   . Gluten Meal Anaphylaxis  . Ciprofloxacin Other (See Comments)    myasthenia  . Erythromycin Other (See Comments)    myasthenia  . Erythromycin Base Other (See Comments)    Myasthenia gravis  . Gemifloxacin Other (See Comments)    myasthenia  . Gentamicin Other (See Comments)    myasthenia  . Heparin Other (See Comments)    myasthenia  . Kanamycin Other (See Comments)    myasthenia  . Levofloxacin Other (See Comments)    myasthenia  . 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 Neomycin Other (See Comments)    myasthenia  . Norfloxacin Other (See Comments)    myasthenia  . Quinolones Other (See Comments)    Myasthenia gravis  . Streptomycin Other (See Comments)    myasthenia  . Tobramycin Other (See Comments)    myasthenia    Family History  Problem Relation Age of Onset  . Stroke Mother   . Diabetes Mother   . Heart disease Brother   . Dementia Sister       Prior to Admission medications   Medication Sig Start Date End Date Taking? Authorizing  Provider  aspirin 81 MG tablet Take 81 mg by mouth daily.    Yes Historical Provider, MD  B Complex-C-Folic Acid (DIALYVITE 063 PO) Take 1 tablet by mouth daily.   Yes Historical Provider, MD  calcium acetate (PHOSLO) 667 MG capsule Take 1,334 mg by mouth 5 (five) times daily. With each meal and snack.   Yes Historical Provider, MD  clonazePAM (KLONOPIN) 0.5 MG tablet Take 0.5 mg by mouth daily as needed. 02/12/13  Yes Historical Provider, MD  EPOETIN ALFA IJ Inject 11,000 Units as directed once a week.    Yes Historical Provider, MD   folic acid (FOLVITE) 1 MG tablet Take 1 mg by mouth daily.   Yes Historical Provider, MD  gabapentin (NEURONTIN) 300 MG capsule Take 300 mg by mouth at bedtime as needed. For feet/nerve pain   Yes Historical Provider, MD  methotrexate (RHEUMATREX) 2.5 MG tablet Take 10 mg by mouth once a week. Caution:Chemotherapy. Protect from light.Take on Thursday.   Yes Historical Provider, MD  omeprazole (PRILOSEC) 20 MG capsule Take 20 mg by mouth daily.   Yes Historical Provider, MD  paricalcitol (ZEMPLAR) 5 MCG/ML injection Inject 2 mcg into the vein every Monday, Wednesday, and Friday. In home dialysis   Yes Historical Provider, MD  predniSONE (DELTASONE) 5 MG tablet Take 3 tablets (15 mg total) by mouth daily with breakfast. 12/30/13  Yes Kathrynn Ducking, MD     Physical Exam: Filed Vitals:   02/08/14 0745 02/08/14 0815 02/08/14 0830 02/08/14 0845  BP:  108/50 114/55 123/52  Pulse: 87 86 79 76  Temp:      TempSrc:      Resp: 22 26 23  33  SpO2: 96% 97% 98% 97%     General: AAO x 3, no distress HEENT: Normocephalic and Atraumatic, Mucous membranes pink                PERRLA; EOM intact; No scleral icterus,                 Nares: Patent, Oropharynx: Clear, Fair Dentition                 Neck: FROM, no cervical lymphadenopathy, thyromegaly, carotid bruit or JVD;  Breasts: deferred CHEST WALL: No tenderness  CHEST: Normal respiration, clear to auscultation bilaterally  HEART: Regular rate and rhythm; no murmurs rubs or gallops  BACK: No kyphosis or scoliosis; no CVA tenderness  ABDOMEN: Positive Bowel Sounds, soft, non-tender; no masses, no organomegaly Rectal Exam: deferred EXTREMITIES: No cyanosis, clubbing, or edema- fistula is erythematous and warm, mild tenderness Genitalia: not examined  SKIN:  no rash or ulceration  CNS: Alert and Oriented x 4, Nonfocal exam, CN 2-12 intact  Labs on Admission:  Basic Metabolic Panel:  Recent Labs Lab 02/08/14 0645  NA 139  K 4.2  CL 100   CO2 25  GLUCOSE 101*  BUN 45*  CREATININE 5.52*  CALCIUM 9.4   Liver Function Tests:  Recent Labs Lab 02/08/14 0645  AST 18  ALT 16  ALKPHOS 56  BILITOT 0.6  PROT 6.0  ALBUMIN 2.9*   No results found for this basename: LIPASE, AMYLASE,  in the last 168 hours No results found for this basename: AMMONIA,  in the last 168 hours CBC:  Recent Labs Lab 02/08/14 0645  WBC 11.0*  NEUTROABS 10.0*  HGB 12.5*  HCT 39.6  MCV 101.8*  PLT 102*   Cardiac Enzymes: No results found for this basename: CKTOTAL, CKMB,  CKMBINDEX, TROPONINI,  in the last 168 hours  BNP (last 3 results) No results found for this basename: PROBNP,  in the last 8760 hours CBG: No results found for this basename: GLUCAP,  in the last 168 hours  Radiological Exams on Admission: Dg Chest Port 1 View  02/08/2014   CLINICAL DATA:  Vascular access problem.  Generalized body aches.  EXAM: PORTABLE CHEST - 1 VIEW  COMPARISON:  None 10/2012  FINDINGS: The heart size and mediastinal contours are within normal limits. Both lungs are clear. Surgical clips near the gastroesophageal junction. The visualized skeletal structures are unremarkable.  IMPRESSION: No active disease.   Electronically Signed   By: Curlene Dolphin M.D.   On: 02/08/2014 07:52     Assessment/Plan Principal Problem:   Cellulitis around left arm fistula- fever 101.3 - continue vancomycin and Zosyn -But cultures obtain -Vascular surgery following for need for temporary access if improvement is not noted over next 24 hours  Active Problems:   End stage renal disease on dialysis -consult nephrology-underwent dialysis yesterday   Myasthenia exacerbation -Takes prednisone at home for this -Continued to treat underlying infection without increasing dose of prednisone yet - obtain PT eval    Rheumatoid arthritis -continue to follow  HIT -Start Arixtra for DVT prophylaxis   Consulted: nephrology and vascular  Code Status: DNR Family  Communication: with wife  DVT Prophylaxis:SCDs  Time spent: >45 min  [place], MD [facility]  If 7PM-7AM, please contact night-coverage [email or web address] 02/08/2014, 12:09 PM

## 2014-02-09 ENCOUNTER — Inpatient Hospital Stay (HOSPITAL_COMMUNITY): Payer: Medicare Other

## 2014-02-09 ENCOUNTER — Encounter (HOSPITAL_COMMUNITY): Payer: Self-pay

## 2014-02-09 DIAGNOSIS — A419 Sepsis, unspecified organism: Secondary | ICD-10-CM

## 2014-02-09 DIAGNOSIS — IMO0002 Reserved for concepts with insufficient information to code with codable children: Secondary | ICD-10-CM

## 2014-02-09 DIAGNOSIS — R7881 Bacteremia: Secondary | ICD-10-CM | POA: Diagnosis present

## 2014-02-09 DIAGNOSIS — D539 Nutritional anemia, unspecified: Secondary | ICD-10-CM | POA: Diagnosis present

## 2014-02-09 DIAGNOSIS — Z992 Dependence on renal dialysis: Secondary | ICD-10-CM

## 2014-02-09 DIAGNOSIS — A4901 Methicillin susceptible Staphylococcus aureus infection, unspecified site: Secondary | ICD-10-CM

## 2014-02-09 LAB — BASIC METABOLIC PANEL
Anion gap: 14 (ref 5–15)
BUN: 66 mg/dL — ABNORMAL HIGH (ref 6–23)
CO2: 24 mEq/L (ref 19–32)
Calcium: 9.3 mg/dL (ref 8.4–10.5)
Chloride: 97 mEq/L (ref 96–112)
Creatinine, Ser: 7.5 mg/dL — ABNORMAL HIGH (ref 0.50–1.35)
GFR, EST AFRICAN AMERICAN: 7 mL/min — AB (ref >90)
GFR, EST NON AFRICAN AMERICAN: 6 mL/min — AB (ref >90)
Glucose, Bld: 211 mg/dL — ABNORMAL HIGH (ref 70–99)
POTASSIUM: 5.6 meq/L — AB (ref 3.7–5.3)
Sodium: 135 mEq/L — ABNORMAL LOW (ref 137–147)

## 2014-02-09 LAB — CBC
HCT: 35.7 % — ABNORMAL LOW (ref 39.0–52.0)
Hemoglobin: 11.1 g/dL — ABNORMAL LOW (ref 13.0–17.0)
MCH: 30.7 pg (ref 26.0–34.0)
MCHC: 31.1 g/dL (ref 30.0–36.0)
MCV: 98.6 fL (ref 78.0–100.0)
PLATELETS: 98 10*3/uL — AB (ref 150–400)
RBC: 3.62 MIL/uL — AB (ref 4.22–5.81)
RDW: 15.1 % (ref 11.5–15.5)
WBC: 11.5 10*3/uL — ABNORMAL HIGH (ref 4.0–10.5)

## 2014-02-09 LAB — URIC ACID: URIC ACID, SERUM: 5.9 mg/dL (ref 4.0–7.8)

## 2014-02-09 MED ORDER — RENA-VITE PO TABS
1.0000 | ORAL_TABLET | Freq: Every day | ORAL | Status: DC
  Administered 2014-02-09 – 2014-02-15 (×7): 1 via ORAL
  Filled 2014-02-09 (×10): qty 1

## 2014-02-09 MED ORDER — CEFAZOLIN SODIUM-DEXTROSE 2-3 GM-% IV SOLR
2.0000 g | Freq: Once | INTRAVENOUS | Status: AC
  Administered 2014-02-09: 2 g via INTRAVENOUS
  Filled 2014-02-09: qty 50

## 2014-02-09 MED ORDER — ASPIRIN 81 MG PO TABS
81.0000 mg | ORAL_TABLET | Freq: Every day | ORAL | Status: DC
  Administered 2014-02-09 – 2014-02-10 (×2): 81 mg via ORAL
  Filled 2014-02-09 (×3): qty 1

## 2014-02-09 MED ORDER — VANCOMYCIN HCL IN DEXTROSE 1-5 GM/200ML-% IV SOLN
1000.0000 mg | INTRAVENOUS | Status: DC
Start: 2014-02-10 — End: 2014-02-11
  Administered 2014-02-10: 1000 mg via INTRAVENOUS
  Filled 2014-02-09: qty 200

## 2014-02-09 MED ORDER — HYDROCODONE-ACETAMINOPHEN 5-325 MG PO TABS
1.0000 | ORAL_TABLET | Freq: Four times a day (QID) | ORAL | Status: DC | PRN
  Administered 2014-02-12: 1 via ORAL
  Filled 2014-02-09: qty 1

## 2014-02-09 MED ORDER — PARICALCITOL 5 MCG/ML IV SOLN
2.0000 ug | INTRAVENOUS | Status: DC
  Administered 2014-02-10: 2 ug via INTRAVENOUS
  Filled 2014-02-09: qty 0.4

## 2014-02-09 MED ORDER — SODIUM CHLORIDE 0.9 % IV SOLN
250.0000 mg | Freq: Once | INTRAVENOUS | Status: AC
  Administered 2014-02-10: 250 mg via INTRAVENOUS
  Filled 2014-02-09 (×2): qty 20

## 2014-02-09 MED ORDER — VANCOMYCIN HCL IN DEXTROSE 750-5 MG/150ML-% IV SOLN
750.0000 mg | Freq: Once | INTRAVENOUS | Status: AC
  Administered 2014-02-09: 750 mg via INTRAVENOUS
  Filled 2014-02-09: qty 150

## 2014-02-09 MED ORDER — IOHEXOL 300 MG/ML  SOLN
80.0000 mL | Freq: Once | INTRAMUSCULAR | Status: AC | PRN
  Administered 2014-02-09: 80 mL via INTRAVENOUS

## 2014-02-09 MED ORDER — INFLUENZA VAC SPLIT QUAD 0.5 ML IM SUSY
0.5000 mL | PREFILLED_SYRINGE | INTRAMUSCULAR | Status: AC | PRN
  Administered 2014-02-16: 0.5 mL via INTRAMUSCULAR
  Filled 2014-02-09 (×2): qty 0.5

## 2014-02-09 NOTE — Progress Notes (Signed)
NIF = -60

## 2014-02-09 NOTE — Consult Note (Signed)
[facility]    Date of Admission:  02/08/2014           Day 3 vancomycin       Reason for Consult: Automatic consultation for staph aureus bacteremia     Principal Problem:   Staphylococcus aureus bacteremia Active Problems:   Sepsis   Cellulitis of arm, left   End stage renal disease on dialysis   HIT (heparin-induced thrombocytopenia)   Hypertension   Celiac sprue   Rheumatoid arthritis   Myasthenia exacerbation   Macrocytic anemia   . aspirin  81 mg Oral Daily  . calcium acetate  1,334 mg Oral 5 X Daily  . [START ON 02/10/2014] ferric gluconate (FERRLECIT/NULECIT) IV  250 mg Intravenous Once  . folic acid  1 mg Oral Daily  . gabapentin  300 mg Oral QHS  . multivitamin  1 tablet Oral QHS  . pantoprazole  40 mg Oral Daily  . [START ON 02/10/2014] paricalcitol  2 mcg Intravenous Q M,W,F  . predniSONE  15 mg Oral Q breakfast  . sodium chloride  3 mL Intravenous Q12H  . [START ON 02/10/2014] vancomycin  1,000 mg Intravenous Q M,W,F-HD    Recommendations: 1. Continue vancomycin 2. Add cefazolin pending antibiotic susceptibilities to optimize coverage for MSSA 3. Repeat blood cultures 4. Transthoracic echocardiogram   Assessment: Demartini has a staph aureus soft tissue infection overlying his fistula and bacteremia. I will continue vancomycin and add cefazolin to optimize coverage for MRSA and MSSA pending final susceptibilities. I will also repeat blood cultures now and obtain a transthoracic echocardiogram.    HPI: Adam Walsh is a 73 y.o. male with end-stage renal disease who dialyzes through a left upper arm fistula. He suffered a bad bruise and abrasion on his upper arm 2 weeks ago when an old fence post that he was working with his upper arm. Several days ago he began to notice redness, warmth and pain around the lower end of the fistula associated with fever and chills. He was admitted 48 hours ago with a temperature of 102.9  and extensive cellulitis overlying the fistula. The vascular ultrasound revealed "Preliminary findings: Patent access. A small amount of fluid is noted surrounding the access just above AC area." Both admission blood cultures are growing staph aureus.  Review of Systems: Constitutional: positive for chills, fevers and malaise, negative for anorexia, sweats and weight loss Eyes: negative Ears, nose, mouth, throat, and face: negative Respiratory: negative Cardiovascular: negative Gastrointestinal: negative Genitourinary:negative  Past Medical History  Diagnosis Date  . Hemodialysis patient     Monday, Wednesday and Friday  . HIT (heparin-induced thrombocytopenia)   . Myasthenia gravis   . Hypertension   . History of pyelonephritis   . History of nephrolithiasis   . Celiac sprue   . Hearing deficit     left hearing aid  . Neuropathy     Peripheral neuropathy  . Pancreatic cyst     History  Substance Use Topics  . Smoking status: Former Smoker    Types: Cigarettes    Quit date: 05/30/1969  . Smokeless tobacco: Never Used  . Alcohol Use: No    Family History  Problem Relation Age of Onset  . Stroke Mother   . Diabetes Mother   . Heart disease Brother   . Dementia Sister    Allergies  Allergen Reactions  . Avelox [Moxifloxacin Hcl In Nacl]     Pt  quit breathing  . Gluten   . Gluten Meal Anaphylaxis  . Ciprofloxacin Other (See Comments)    myasthenia  . Erythromycin Other (See Comments)    myasthenia  . Erythromycin Base Other (See Comments)    Myasthenia gravis  . Gemifloxacin Other (See Comments)    myasthenia  . Gentamicin Other (See Comments)    myasthenia  . Heparin Other (See Comments)    myasthenia  . Kanamycin Other (See Comments)    myasthenia  . Levofloxacin Other (See Comments)    myasthenia  . 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 Neomycin Other (See Comments)    myasthenia  .  Norfloxacin Other (See Comments)    myasthenia  . Quinolones Other (See Comments)    Myasthenia gravis  . Streptomycin Other (See Comments)    myasthenia  . Tobramycin Other (See Comments)    myasthenia    OBJECTIVE: Blood pressure 112/54, pulse 88, temperature 100 F (37.8 C), temperature source Oral, resp. rate 20, height 5' 10.08" (1.78 m), weight 187 lb 6.3 oz (85 kg), SpO2 96.00%. General: He is sleeping but arouses easily Skin: Scattered bruises and a healing abrasion on his left upper, outer arm Lungs: Clear Cor: Distant but regular S1 and S2 with an early 1/6 systolic murmur Abdomen: Soft and nontender Joints and extremities: Large area of erythema and induration over the lower portion of his left upper arm fistula. It is painful to palpation and warm to touch  Lab Results Lab Results  Component Value Date   WBC 11.0* 02/08/2014   HGB 12.5* 02/08/2014   HCT 39.6 02/08/2014   MCV 101.8* 02/08/2014   PLT 102* 02/08/2014    Lab Results  Component Value Date   CREATININE 5.52* 02/08/2014   BUN 45* 02/08/2014   NA 139 02/08/2014   K 4.2 02/08/2014   CL 100 02/08/2014   CO2 25 02/08/2014    Lab Results  Component Value Date   ALT 16 02/08/2014   AST 18 02/08/2014   ALKPHOS 56 02/08/2014   BILITOT 0.6 02/08/2014     Microbiology: Recent Results (from the past 240 hour(s))  CULTURE, BLOOD (ROUTINE X 2)     Status: None   Collection Time    02/08/14  6:29 AM      Result Value Ref Range Status   Specimen Description BLOOD RIGHT ARM   Final   Special Requests BOTTLES DRAWN AEROBIC AND ANAEROBIC 5CC   Final   Culture  Setup Time     Final   Value: 02/08/2014 15:16     Performed at [facility]   Culture     Final   Value: STAPHYLOCOCCUS AUREUS     Note: Gram Stain Report Called to,Read Back By and Verified With: Sima Matas RN on 02/09/14 at 02:00 by Rise Mu     Performed at [facility]   Report Status PENDING   Incomplete  CULTURE, BLOOD (ROUTINE X  2)     Status: None   Collection Time    02/08/14  6:45 AM      Result Value Ref Range Status   Specimen Description BLOOD RIGHT ARM   Final   Special Requests BOTTLES DRAWN AEROBIC AND ANAEROBIC [facility]   Final   Culture  Setup Time     Final   Value: 02/08/2014 15:18     Performed at [facility]  Final   Value: STAPHYLOCOCCUS AUREUS     Note: Gram Stain Report Called to,Read Back By and Verified With: Sima Matas RN on 02/09/14 at 02:00 by Rise Mu     Performed at [facility]   Report Status PENDING   Incomplete    Michel Bickers, [place] [facility] [phone] pager   [phone] cell 02/09/2014, 2:25 PM

## 2014-02-09 NOTE — Progress Notes (Addendum)
   Daily Progress Note  Assessment/Planning: L arm cellulitis peri-access   2/2 BC positive for staph. Aureus  Would continue antibiotics to try to salvage the fistula  If not responsive to antibiotics, may need to consider if the fistula itself if infected and possible resect  Would CT L arm prior to considering such to try to better quantify the extent of infection  Will check on patient periodically   Subjective    Pain somewhat improved  Objective Filed Vitals:   02/08/14 2100 02/09/14 0306 02/09/14 1011 02/09/14 1148  BP:  111/43 112/54   Pulse:  76 88   Temp: 101 F (38.3 C) 99.8 F (37.7 C) 100 F (37.8 C)   TempSrc: Oral  Oral   Resp:  18 20   Height:      Weight:    187 lb 6.3 oz (85 kg)  SpO2:  98% 96%     Intake/Output Summary (Last 24 hours) at 02/09/14 1234 Last data filed at 02/09/14 0900  Gross per 24 hour  Intake    600 ml  Output      0 ml  Net    600 ml   VASC  L arm cellulitis unchanged, no fluct. Fluid palpable   Laboratory CBC    Component Value Date/Time   WBC 11.0* 02/08/2014 0645   WBC 9.6 08/19/2013 1358   HGB 12.5* 02/08/2014 0645   HCT 39.6 02/08/2014 0645   PLT 102* 02/08/2014 0645    BMET    Component Value Date/Time   NA 139 02/08/2014 0645   NA 144 08/19/2013 1358   K 4.2 02/08/2014 0645   CL 100 02/08/2014 0645   CO2 25 02/08/2014 0645   GLUCOSE 101* 02/08/2014 0645   GLUCOSE 134* 08/19/2013 1358   BUN 45* 02/08/2014 0645   BUN 49* 08/19/2013 1358   CREATININE 5.52* 02/08/2014 0645   CALCIUM 9.4 02/08/2014 0645   GFRNONAA 9* 02/08/2014 0645   GFRAA 11* 02/08/2014 0645    Adele Barthel, MD [facility] Office: [phone] Pager: [phone]  02/09/2014, 12:34 PM

## 2014-02-09 NOTE — Progress Notes (Signed)
Patient Demographics  Adam Walsh, is a 73 y.o. male, DOB - 06-May-1941, SWF:[id number]  Admit date - 02/08/2014   Admitting Physician Debbe Odea, MD  Outpatient Primary MD for the patient is Donnie Coffin, MD  LOS - 1   Chief Complaint  Patient presents with  . Vascular Access Problem  . Generalized Body Aches        Subjective:   Adam Walsh today has, No headache, No chest pain, No abdominal pain - No Nausea, No new weakness tingling or numbness, No Cough - SOB. Some mild body aches.  Assessment & Plan    1. Sepsis with cellulitis and possible abscess of left arm AV fistula site. Blood cultures noted for possible staph aureus, IV vancomycin per pharmacy, vascular surgery and renal consulted. Monitor closely.   2. ESRD. Dialyzes 4 times a week at home, renal consulted.   3. Myasthenia gravis and rheumatoid arthritis. Currently says he does not feel that his weakness is worse, on prednisone 3 times a day 5 mg which will be continued, methotrexate on hold till sepsis is improved. Outpatient rheumatology followup once better. Will  have PT OT follow and monitor.   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      Code Status: DO NOT RESUSCITATE  Family Communication: None present  Disposition Plan: Home   Procedures left fistula site ultrasound showing some fluid collection. Chest x-ray unremarkable.   Consults  Renal, Vas Surgery   Medications  Scheduled Meds: . calcium acetate  1,334 mg Oral 5 X Daily  . folic acid  1 mg Oral Daily  . gabapentin  300 mg Oral QHS  . pantoprazole  40 mg Oral Daily  . predniSONE  15 mg Oral Q breakfast  . sodium chloride  3 mL Intravenous Q12H   Continuous Infusions:  PRN Meds:.sodium chloride, acetaminophen, acetaminophen, alum &  mag hydroxide-simeth, [START ON 02/12/2014] Influenza vac split quadrivalent PF, ondansetron (ZOFRAN) IV, ondansetron, sodium chloride  DVT Prophylaxis  SCD ( H/O possible HIT)  Lab Results  Component Value Date   PLT 102* 02/08/2014    Antibiotics     Anti-infectives   Start     Dose/Rate Route Frequency Ordered Stop   02/08/14 0645  vancomycin (VANCOCIN) IVPB 1000 mg/200 mL premix     1,000 mg 200 mL/hr over 60 Minutes Intravenous  Once 02/08/14 0644 02/08/14 0851   02/08/14 0645  piperacillin-tazobactam (ZOSYN) IVPB 2.25 g     2.25 g 100 mL/hr over 30 Minutes Intravenous  Once 02/08/14 0644 02/08/14 0932          Objective:   Filed Vitals:   02/08/14 2005 02/08/14 2100 02/09/14 0306 02/09/14 1011  BP: 131/62  111/43 112/54  Pulse: 97  76 88  Temp: 102.9 F (39.4 C) 101 F (38.3 C) 99.8 F (37.7 C) 100 F (37.8 C)  TempSrc:  Oral  Oral  Resp: 20  18 20   Height:      Weight: 84.9 kg (187 lb 2.7 oz)     SpO2: 94%  98% 96%    Wt Readings from Last 3 Encounters:  02/08/14 84.9 kg (187 lb 2.7 oz)  12/30/13 86.183 kg (190 lb)  11/15/13 85.276 kg (188 lb)  Intake/Output Summary (Last 24 hours) at 02/09/14 1053 Last data filed at 02/09/14 0900  Gross per 24 hour  Intake    600 ml  Output      0 ml  Net    600 ml     Physical Exam  Awake Alert, Oriented X 3, No new F.N deficits, Normal affect [place].AT,PERRAL Supple Neck,No JVD, No cervical lymphadenopathy appriciated.  Symmetrical Chest wall movement, Good air movement bilaterally, CTAB RRR,No Gallops,Rubs or new Murmurs, No Parasternal Heave +ve B.Sounds, Abd Soft, No tenderness, No organomegaly appriciated, No rebound - guarding or rigidity. No Cyanosis, Clubbing or edema, No new Rash or bruise ,  L arm fistula site minimal erythema and warmth     Data Review   Micro Results Recent Results (from the past 240 hour(s))  CULTURE, BLOOD (ROUTINE X 2)     Status: None   Collection Time    02/08/14   6:29 AM      Result Value Ref Range Status   Specimen Description BLOOD RIGHT ARM   Final   Special Requests BOTTLES DRAWN AEROBIC AND ANAEROBIC 5CC   Final   Culture  Setup Time     Final   Value: 02/08/2014 15:16     Performed at [facility]   Culture     Final   Value: STAPHYLOCOCCUS AUREUS     Note: Gram Stain Report Called to,Read Back By and Verified With: Sima Matas RN on 02/09/14 at 02:00 by Rise Mu     Performed at [facility]   Report Status PENDING   Incomplete  CULTURE, BLOOD (ROUTINE X 2)     Status: None   Collection Time    02/08/14  6:45 AM      Result Value Ref Range Status   Specimen Description BLOOD RIGHT ARM   Final   Special Requests BOTTLES DRAWN AEROBIC AND ANAEROBIC [facility]   Final   Culture  Setup Time     Final   Value: 02/08/2014 15:18     Performed at [facility]   Culture     Final   Value: STAPHYLOCOCCUS AUREUS     Note: Gram Stain Report Called to,Read Back By and Verified With: Sima Matas RN on 02/09/14 at 02:00 by Rise Mu     Performed at [facility]   Report Status PENDING   Incomplete    Radiology Reports Dg Chest Port 1 View  02/08/2014   CLINICAL DATA:  Vascular access problem.  Generalized body aches.  EXAM: PORTABLE CHEST - 1 VIEW  COMPARISON:  None 10/2012  FINDINGS: The heart size and mediastinal contours are within normal limits. Both lungs are clear. Surgical clips near the gastroesophageal junction. The visualized skeletal structures are unremarkable.  IMPRESSION: No active disease.   Electronically Signed   By: Curlene Dolphin M.D.   On: 02/08/2014 07:52     CBC  Recent Labs Lab 02/08/14 0645  WBC 11.0*  HGB 12.5*  HCT 39.6  PLT 102*  MCV 101.8*  MCH 32.1  MCHC 31.6  RDW 15.2  LYMPHSABS 0.8  MONOABS 0.1  EOSABS 0.1  BASOSABS 0.0    Chemistries   Recent Labs Lab 02/08/14 0645  NA 139  K 4.2  CL 100  CO2 25  GLUCOSE 101*  BUN 45*  CREATININE 5.52*  CALCIUM 9.4  AST  18  ALT 16  ALKPHOS 56  BILITOT 0.6   ------------------------------------------------------------------------------------------------------------------ estimated creatinine clearance is 12.7 ml/min (  by C-G formula based on Cr of 5.52). ------------------------------------------------------------------------------------------------------------------ No results found for this basename: HGBA1C,  in the last 72 hours ------------------------------------------------------------------------------------------------------------------ No results found for this basename: CHOL, HDL, LDLCALC, TRIG, CHOLHDL, LDLDIRECT,  in the last 72 hours ------------------------------------------------------------------------------------------------------------------ No results found for this basename: TSH, T4TOTAL, FREET3, T3FREE, THYROIDAB,  in the last 72 hours ------------------------------------------------------------------------------------------------------------------ No results found for this basename: VITAMINB12, FOLATE, FERRITIN, TIBC, IRON, RETICCTPCT,  in the last 72 hours  Coagulation profile No results found for this basename: INR, PROTIME,  in the last 168 hours  No results found for this basename: DDIMER,  in the last 72 hours  Cardiac Enzymes No results found for this basename: CK, CKMB, TROPONINI, MYOGLOBIN,  in the last 168 hours ------------------------------------------------------------------------------------------------------------------ No components found with this basename: POCBNP,      Time Spent in minutes   35   Lala Lund K M.D on 02/09/2014 at 10:53 AM  Between 7am to 7pm - Pager - [phone]  After 7pm go to [email or web address] - password [email or web address]  And look for the night coverage person covering for me after hours  [facility] Office  [phone]   **Disclaimer: This note may have been dictated with voice recognition software. Similar sounding words can  inadvertently be transcribed and this note may contain transcription errors which may not have been corrected upon publication of note.**

## 2014-02-09 NOTE — Progress Notes (Signed)
Pt unavailable at this time for NIF parameter.

## 2014-02-09 NOTE — Progress Notes (Addendum)
ANTIBIOTIC CONSULT NOTE - INITIAL  Pharmacy Consult for vancomycin Indication: AVF infxn with Staph Aureus bacteremia  Allergies  Allergen Reactions  . Avelox [Moxifloxacin Hcl In Nacl]     Pt quit breathing  . Gluten   . Gluten Meal Anaphylaxis  . Ciprofloxacin Other (See Comments)    myasthenia  . Erythromycin Other (See Comments)    myasthenia  . Erythromycin Base Other (See Comments)    Myasthenia gravis  . Gemifloxacin Other (See Comments)    myasthenia  . Gentamicin Other (See Comments)    myasthenia  . Heparin Other (See Comments)    myasthenia  . Kanamycin Other (See Comments)    myasthenia  . Levofloxacin Other (See Comments)    myasthenia  . 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 Neomycin Other (See Comments)    myasthenia  . Norfloxacin Other (See Comments)    myasthenia  . Quinolones Other (See Comments)    Myasthenia gravis  . Streptomycin Other (See Comments)    myasthenia  . Tobramycin Other (See Comments)    myasthenia    Patient Measurements: Height: 5' 10.08" (178 cm) Weight: 187 lb 6.3 oz (85 kg) IBW/kg (Calculated) : 73.18 Adjusted Body Weight:   Vital Signs: Temp: 100 F (37.8 C) (09/13 1011) Temp src: Oral (09/13 1011) BP: 112/54 mmHg (09/13 1011) Pulse Rate: 88 (09/13 1011) Intake/Output from previous day: 09/12 0701 - 09/13 0700 In: 360 [P.O.:360] Out: -  Intake/Output from this shift: Total I/O In: 240 [P.O.:240] Out: -   Labs:  Recent Labs  02/08/14 0645  WBC 11.0*  HGB 12.5*  PLT 102*  CREATININE 5.52*   Estimated Creatinine Clearance: 12.7 ml/min (by C-G formula based on Cr of 5.52). No results found for this basename: VANCOTROUGH, Corlis Leak, VANCORANDOM, [place], GENTPEAK, [place], [place], TOBRAPEAK, TOBRARND, AMIKACINPEAK, AMIKACINTROU, AMIKACIN,  in the last 72 hours   Microbiology: Recent Results (from the past 720 hour(s))  CULTURE,  BLOOD (ROUTINE X 2)     Status: None   Collection Time    02/08/14  6:29 AM      Result Value Ref Range Status   Specimen Description BLOOD RIGHT ARM   Final   Special Requests BOTTLES DRAWN AEROBIC AND ANAEROBIC 5CC   Final   Culture  Setup Time     Final   Value: 02/08/2014 15:16     Performed at [facility]   Culture     Final   Value: STAPHYLOCOCCUS AUREUS     Note: Gram Stain Report Called to,Read Back By and Verified With: Sima Matas RN on 02/09/14 at 02:00 by Rise Mu     Performed at [facility]   Report Status PENDING   Incomplete  CULTURE, BLOOD (ROUTINE X 2)     Status: None   Collection Time    02/08/14  6:45 AM      Result Value Ref Range Status   Specimen Description BLOOD RIGHT ARM   Final   Special Requests BOTTLES DRAWN AEROBIC AND ANAEROBIC [facility]   Final   Culture  Setup Time     Final   Value: 02/08/2014 15:18     Performed at [facility]   Culture     Final   Value: STAPHYLOCOCCUS AUREUS     Note: Gram Stain Report Called to,Read Back By and Verified With: Sima Matas RN on 02/09/14 at 02:00 by Rise Mu  Performed at [facility]   Report Status PENDING   Incomplete    Medical History: Past Medical History  Diagnosis Date  . Hemodialysis patient     Monday, Wednesday and Friday  . HIT (heparin-induced thrombocytopenia)   . Myasthenia gravis   . Hypertension   . History of pyelonephritis   . History of nephrolithiasis   . Celiac sprue   . Hearing deficit     left hearing aid  . Neuropathy     Peripheral neuropathy  . Pancreatic cyst     Medications:  Scheduled:  . aspirin  81 mg Oral Daily  . calcium acetate  1,334 mg Oral 5 X Daily  . [START ON 02/10/2014] ferric gluconate (FERRLECIT/NULECIT) IV  250 mg Intravenous Once  . folic acid  1 mg Oral Daily  . gabapentin  300 mg Oral QHS  . multivitamin  1 tablet Oral QHS  . pantoprazole  40 mg Oral Daily  . [START ON 02/10/2014] paricalcitol  2 mcg  Intravenous Q M,W,F  . predniSONE  15 mg Oral Q breakfast  . sodium chloride  3 mL Intravenous Q12H   Assessment: 73yo male with ESRD-HD MWF, next HD on 9/14, with AVF infection and blood cx x 2 (+)SA.  Tm 102.9 and WBC 11.  Pt received Vancomycin 1000mg  IV x 1 on 9/12.   Goal of Therapy:  pre-HD Vanc level 15-25  Plan:  1-  Vanc 1000mg  IV qHD, next on 9/14 2-  F/U cx results  Gracy Bruins, PharmD Clinical Pharmacist [facility]  ---------------------------------------------------------------------------------------- Addendum:  Discussed this patient with Dr. Jonnie Finner who wanted to ensure the patient received an adequate load since the patient now has positive blood cultures. The patient received a 1g LD on 9/12 - will plan to give 750 mg x 1 dose now to complete the load, then continue with normal maintenance doses as scheduled.  Plan 1. Vancomycin 750 mg x 1 dose now (to make total LD of 1750 mg) 2. Continue Vancomycin 1g/HD-MWF 3. Will continue to follow HD schedule/duration, culture results, LOT, and antibiotic de-escalation plans   Alycia Rossetti, PharmD, BCPS Clinical Pharmacist Pager: [phone] 02/09/2014 3:01 PM

## 2014-02-09 NOTE — Evaluation (Signed)
Physical Therapy Evaluation Patient Details Name: Adam Walsh MRN: [id number] DOB: 09/24/1940 Today's Date: 02/09/2014   History of Present Illness  HPI: SAATHVIK EVERY is a 73 y.o. male the old with past medical history of end-stage renal disease on hemodialysis, myasthenia gravis, rheumatoid arthritis, HIT. The patient underwent dialysis day before admission which was uneventful and later in the evening noticed redness around his fistula. By this morning he was having fevers and generalized weakness along with muscle aches and joint pains.he decided to come to the ER where it was noted that his fistula appeared to be infected. He was evaluated with an ultrasound and is negative for an abscess. He is being admitted for this infection.   Clinical Impression   Pt admitted with above. Pt currently with functional limitations due to the deficits listed below (see PT Problem List).  Pt will benefit from skilled PT to increase their independence and safety with mobility to allow discharge to the venue listed below.       Follow Up Recommendations Home health PT;Supervision/Assistance - 24 hour    Equipment Recommendations  Rolling walker with 5" wheels;3in1 (PT)    Recommendations for Other Services       Precautions / Restrictions Precautions Precautions: Fall      Mobility  Bed Mobility Overal bed mobility: Needs Assistance Bed Mobility: Supine to Sit     Supine to sit: Min assist     General bed mobility comments: Used bedrail to push up and min assist given at pelvic girdle to acheive fully upright sitting  Transfers Overall transfer level: Needs assistance Equipment used: 1 person hand held assist Transfers: Sit to/from Stand Sit to Stand: Min assist         General transfer comment: steady assist  Ambulation/Gait Ambulation/Gait assistance: Min assist Ambulation Distance (Feet): 100 Feet Assistive device: 1 person hand held assist (and hallway rail)   Gait  velocity: quite slow   General Gait Details: Very slow, with generalized aching, and reports of pain in bil hips/thighs; dependent on UE support  Stairs            Wheelchair Mobility    Modified Rankin (Stroke Patients Only)       Balance                                             Pertinent Vitals/Pain Pain Assessment: 0-10 Pain Score: 8  Pain Location: Generalized pain; also describes bilateral hip and posterior thigh pain with standing/amb Pain Descriptors / Indicators: Aching Pain Intervention(s): Repositioned;Patient requesting pain meds-RN notified    Home Living Family/patient expects to be discharged to:: Private residence Living Arrangements: Spouse/significant other Available Help at Discharge: Family;Available 24 hours/day Type of Home: House Home Access: Stairs to enter Entrance Stairs-Rails: Right Entrance Stairs-Number of Steps: 5 Home Layout: One level Home Equipment: None      Prior Function Level of Independence: Independent               Hand Dominance        Extremity/Trunk Assessment   Upper Extremity Assessment: Generalized weakness (Generalized achiness)           Lower Extremity Assessment: Generalized weakness (and generalized achiness)         Communication   Communication: No difficulties;HOH  Cognition Arousal/Alertness: Awake/alert Behavior During Therapy: WFL for tasks assessed/performed Overall  Cognitive Status: Within Functional Limits for tasks assessed                      General Comments      Exercises        Assessment/Plan    PT Assessment Patient needs continued PT services  PT Diagnosis Difficulty walking;Generalized weakness;Acute pain   PT Problem List Decreased strength;Decreased activity tolerance;Decreased balance;Decreased mobility;Decreased knowledge of use of DME;Pain  PT Treatment Interventions DME instruction;Gait training;Stair training;Functional  mobility training;Therapeutic activities;Therapeutic exercise;Balance training;Patient/family education   PT Goals (Current goals can be found in the Care Plan section) Acute Rehab PT Goals Patient Stated Goal: simply to feel better PT Goal Formulation: With patient Time For Goal Achievement: 02/23/14 Potential to Achieve Goals: Good    Frequency Min 3X/week   Barriers to discharge        Co-evaluation               End of Session Equipment Utilized During Treatment: Gait belt Activity Tolerance: Patient tolerated treatment well Patient left: in bed;with call bell/phone within reach;with family/visitor present (and Dr. Jonnie Finner) Nurse Communication: Mobility status;Patient requests pain meds         Time: 3832-9191 PT Time Calculation (min): 18 min   Charges:   PT Evaluation $Initial PT Evaluation Tier I: 1 Procedure PT Treatments $Gait Training: 8-22 mins   PT G Codes:          Quin Hoop 02/09/2014, 2:48 PM  Roney Marion, [place] Pager [phone] Office [phone]

## 2014-02-09 NOTE — Consult Note (Addendum)
Indication for Consultation:  Management of ESRD/hemodialysis; anemia, hypertension/volume and secondary hyperparathyroidism  HPI: Adam Walsh is a 73 y.o. male who presented to the ED yesterday with complaints of L AVF redness and generalized weakness. He does home HD 4 days a week, Sunday, MWF for 2.5 hours. He had recently been having some issues with increased arterial pressure during treatment, otherwise no other complications with HD. Per his wife he has chronic weakness and body aches but has been worse the past 2 days. He was admitted for cellulitis, [facility] has evaluated pt and there is no abscess. He will continue on IV antibiotics. Will arrange for HD while in the hospital.   Past Medical History  Diagnosis Date  . Hemodialysis patient     Monday, Wednesday and Friday  . HIT (heparin-induced thrombocytopenia)   . Myasthenia gravis   . Hypertension   . History of pyelonephritis   . History of nephrolithiasis   . Celiac sprue   . Hearing deficit     left hearing aid  . Neuropathy     Peripheral neuropathy  . Pancreatic cyst    Past Surgical History  Procedure Laterality Date  . Exploratory laparotomy    . Transurethral resection of prostate    . Cholecystectomy    . Av fistula placement, radiocephalic  96/22/29    Left  . Basilic vein transposition  10/06/10    2nd stage Left arm  . Hematoma evacuation  11/23/10    Left Arm    Family History  Problem Relation Age of Onset  . Stroke Mother   . Diabetes Mother   . Heart disease Brother   . Dementia Sister    Social History:  reports that he quit smoking about 44 years ago. His smoking use included Cigarettes. He smoked 0.00 packs per day. He has never used smokeless tobacco. He reports that he does not drink alcohol or use illicit drugs. Allergies  Allergen Reactions  . Avelox [Moxifloxacin Hcl In Nacl]     Pt quit breathing  . Gluten   . Gluten Meal Anaphylaxis  . Ciprofloxacin Other (See Comments)     myasthenia  . Erythromycin Other (See Comments)    myasthenia  . Erythromycin Base Other (See Comments)    Myasthenia gravis  . Gemifloxacin Other (See Comments)    myasthenia  . Gentamicin Other (See Comments)    myasthenia  . Heparin Other (See Comments)    myasthenia  . Kanamycin Other (See Comments)    myasthenia  . Levofloxacin Other (See Comments)    myasthenia  . 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 Neomycin Other (See Comments)    myasthenia  . Norfloxacin Other (See Comments)    myasthenia  . Quinolones Other (See Comments)    Myasthenia gravis  . Streptomycin Other (See Comments)    myasthenia  . Tobramycin Other (See Comments)    myasthenia   Prior to Admission medications   Medication Sig Start Date End Date Taking? Authorizing Provider  aspirin 81 MG tablet Take 81 mg by mouth daily.    Yes Historical Provider, MD  B Complex-C-Folic Acid (DIALYVITE 798 PO) Take 1 tablet by mouth daily.   Yes Historical Provider, MD  calcium acetate (PHOSLO) 667 MG capsule Take 1,334 mg by mouth 5 (five) times daily. With each meal and snack.   Yes Historical Provider, MD  clonazePAM (KLONOPIN) 0.5 MG tablet Take 0.5 mg  by mouth daily as needed. 02/12/13  Yes Historical Provider, MD  EPOETIN ALFA IJ Inject 11,000 Units as directed once a week.    Yes Historical Provider, MD  folic acid (FOLVITE) 1 MG tablet Take 1 mg by mouth daily.   Yes Historical Provider, MD  gabapentin (NEURONTIN) 300 MG capsule Take 300 mg by mouth at bedtime as needed. For feet/nerve pain   Yes Historical Provider, MD  methotrexate (RHEUMATREX) 2.5 MG tablet Take 10 mg by mouth once a week. Caution:Chemotherapy. Protect from light.Take on Thursday.   Yes Historical Provider, MD  omeprazole (PRILOSEC) 20 MG capsule Take 20 mg by mouth daily.   Yes Historical Provider, MD  paricalcitol (ZEMPLAR) 5 MCG/ML injection Inject 2 mcg into the vein  every Monday, Wednesday, and Friday. In home dialysis   Yes Historical Provider, MD  predniSONE (DELTASONE) 5 MG tablet Take 3 tablets (15 mg total) by mouth daily with breakfast. 12/30/13  Yes Kathrynn Ducking, MD   Current Facility-Administered Medications  Medication Dose Route Frequency Provider Last Rate Last Dose  . 0.9 %  sodium chloride infusion  250 mL Intravenous PRN Debbe Odea, MD      . acetaminophen (TYLENOL) tablet 650 mg  650 mg Oral Q6H PRN Debbe Odea, MD   650 mg at 02/08/14 2017   Or  . acetaminophen (TYLENOL) suppository 650 mg  650 mg Rectal Q6H PRN Debbe Odea, MD      . alum & mag hydroxide-simeth (MAALOX/MYLANTA) 200-200-20 MG/5ML suspension 30 mL  30 mL Oral Q6H PRN Debbe Odea, MD      . aspirin tablet 81 mg  81 mg Oral Daily Thurnell Lose, MD      . calcium acetate (PHOSLO) capsule 1,334 mg  1,334 mg Oral 5 X Daily Debbe Odea, MD   1,334 mg at 02/09/14 0826  . folic acid (FOLVITE) tablet 1 mg  1 mg Oral Daily Debbe Odea, MD   1 mg at 02/09/14 0929  . gabapentin (NEURONTIN) capsule 300 mg  300 mg Oral QHS Debbe Odea, MD   300 mg at 02/08/14 2208  . [START ON 02/12/2014] Influenza vac split quadrivalent PF (FLUARIX) injection 0.5 mL  0.5 mL Intramuscular Prior to discharge Thurnell Lose, MD      . ondansetron [facility]) injection 4 mg  4 mg Intravenous Q6H PRN Debbe Odea, MD      . pantoprazole (PROTONIX) EC tablet 40 mg  40 mg Oral Daily Debbe Odea, MD   40 mg at 02/09/14 0929  . [START ON 02/10/2014] paricalcitol (ZEMPLAR) injection 2 mcg  2 mcg Intravenous Q M,W,F Thurnell Lose, MD      . predniSONE (DELTASONE) tablet 15 mg  15 mg Oral Q breakfast Debbe Odea, MD   15 mg at 02/09/14 0827  . sodium chloride 0.9 % injection 3 mL  3 mL Intravenous Q12H Debbe Odea, MD   3 mL at 02/09/14 0930  . sodium chloride 0.9 % injection 3 mL  3 mL Intravenous PRN Debbe Odea, MD   3 mL at 02/09/14 0931   Labs: Basic Metabolic Panel:  Recent Labs Lab  02/08/14 0645  NA 139  K 4.2  CL 100  CO2 25  GLUCOSE 101*  BUN 45*  CREATININE 5.52*  CALCIUM 9.4   Liver Function Tests:  Recent Labs Lab 02/08/14 0645  AST 18  ALT 16  ALKPHOS 56  BILITOT 0.6  PROT 6.0  ALBUMIN 2.9*   No results  found for this basename: LIPASE, AMYLASE,  in the last 168 hours No results found for this basename: AMMONIA,  in the last 168 hours CBC:  Recent Labs Lab 02/08/14 0645  WBC 11.0*  NEUTROABS 10.0*  HGB 12.5*  HCT 39.6  MCV 101.8*  PLT 102*   Cardiac Enzymes: No results found for this basename: CKTOTAL, CKMB, CKMBINDEX, TROPONINI,  in the last 168 hours CBG: No results found for this basename: GLUCAP,  in the last 168 hours Iron Studies: No results found for this basename: IRON, TIBC, TRANSFERRIN, FERRITIN,  in the last 72 hours Studies/Results: Dg Chest Port 1 View  02/08/2014   CLINICAL DATA:  Vascular access problem.  Generalized body aches.  EXAM: PORTABLE CHEST - 1 VIEW  COMPARISON:  None 10/2012  FINDINGS: The heart size and mediastinal contours are within normal limits. Both lungs are clear. Surgical clips near the gastroesophageal junction. The visualized skeletal structures are unremarkable.  IMPRESSION: No active disease.   Electronically Signed   By: Curlene Dolphin M.D.   On: 02/08/2014 07:52   Review of Systems: Gen: Reports weakness and fatigue. Denies any fever, chills, sweats, anorexia, malaise, weight loss, and sleep disorder HEENT: No visual complaints, No history of Retinopathy. Normal external appearance No Epistaxis or Sore throat. No sinusitis.  Hard of hearing CV: Denies chest pain, angina, palpitations, syncope, orthopnea, PND, peripheral edema, and claudication. Resp: Denies dyspnea at rest, dyspnea with exercise, cough, sputum, wheezing, coughing up blood, and pleurisy. GI: Denies vomiting blood, jaundice, and fecal incontinence.   Denies dysphagia or odynophagia. GU : Denies urinary burning, blood in urine,  urinary frequency, urinary hesitancy, nocturnal urination, and urinary incontinence.  No renal calculi. MS: Denies joint pain, limitation of movement, and swelling, stiffness, low back pain, extremity pain. Denies muscle weakness, cramps, atrophy.  No use of non steroidal antiinflammatory drugs. Derm: Denies rash, itching, dry skin, hives, moles, warts, or unhealing ulcers.  Psych: Denies depression, anxiety, memory loss, suicidal ideation, hallucinations, paranoia, and confusion. Heme: Denies bruising, bleeding, and enlarged lymph nodes. Neuro: No headache.  No diplopia. No dysarthria.  No dysphasia.  No history of CVA.  No Seizures. No paresthesias.  No weakness. Endocrine No DM.  No Thyroid disease.  No Adrenal disease.  Physical Exam: Filed Vitals:   02/08/14 2005 02/08/14 2100 02/09/14 0306 02/09/14 1011  BP: 131/62  111/43 112/54  Pulse: 97  76 88  Temp: 102.9 F (39.4 C) 101 F (38.3 C) 99.8 F (37.7 C) 100 F (37.8 C)  TempSrc:  Oral  Oral  Resp: 20  18 20   Height:      Weight: 84.9 kg (187 lb 2.7 oz)     SpO2: 94%  98% 96%     General: Well developed, well nourished, in no acute distress. Head: Normocephalic, atraumatic, sclera non-icteric, mucus membranes are moist Neck: Supple. JVD not elevated. No carotid bruits Lungs: Clear bilaterally to auscultation without wheezes, rales, or rhonchi. Breathing is unlabored. Heart: RRR with S1 S2. No murmurs, rubs, or gallops appreciated. Abdomen: Soft, non-tender, non-distended with normoactive bowel sounds. No rebound/guarding. No obvious abdominal masses. M-S:  Strength and tone appear normal for age. Lower extremities:without edema or ischemic changes, no open wounds  Neuro: Alert and oriented X 3. Moves all extremities spontaneously. Psych:  Responds to questions appropriately with a normal affect. Dialysis Access: L AVF, +b/t erythema and warmth. Mild tenderness  Dialysis Orders:  Home HD using NxStage with PureFlow 4X Week,  MonWedFriSat, CAR  172, Therapy Fluid 1.0 K 45 Lactate, Volume per Tx 25 (liters), Flow Fraction 45 %, BFR 400, EDW 84 Epogen- 11,000u q week Venofer 300mg  once 9/15 Paricalcititol 2 mcg 3x week  Assessment/Plan: 1. Cellulitis- tmax 102.9. Blood cultures +Staph. Vanc per pharmacy. Small amount of fluid around access. Avoid sticking through infected skin per VVS   2. ESRD -  Home HD, Sunday, MWF. 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 K+4.2. HD Monday.  3. Hypertension/volume  - 112/54 close to edw. Get standing wt today. Chest xray clear 4. Anemia  - hgb 12.5, hold ESA for now, watch CBC. Cont Fe, give 9/14- tsat 10 5. Metabolic bone disease -  Ca+ 9.4 10.3 corrected. Hold zemplar.  6. Nutrition - alb 2.9. Gluten free diet.  7. 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 Myasthenia gravis- per primary 9. Arthritis- per primary- methotrexate outpt  Shelle Iron, NP [facility] [phone] 02/09/2014, 11:00 AM   Pt seen, examined and agree w A/P as above. Home HD patient on HD for 5 years, using button-hole most of that time, presents with reddening of the distal buttonhole site, fevers and is growing staph aureus in both blood cx's.  There is induration on exam just distal to the [facility], no drainage or fluctuance, there is a large area of erythema.  The access is likely infected at the [facility] site, [facility] has seen and recommends trial of IV abx for 24-48 hrs. If not improving will have to have vein resection acc to Dr Bridgett Larsson.  ID has seen and added Ancef.  Vanc dose in ER was small, have asked pharm to complete the loading dose. Dr Bridgett Larsson wants a CT of the upper arm and I have ordered that. Will mark boundaries of the cellulitis.  He does not need HD today and will likely hold off tomorrow as well.  The fistula is not likely to be usable for dialysis and if he does not improve will need HD cath placement , probably temporary HD cath , for HD possibly in the next 24-48 hrs. Have d/w Dr Bridgett Larsson , pt and his wife.   Kelly Splinter MD pager  [phone]    cell [phone] 02/09/2014, 3:02 PM

## 2014-02-09 NOTE — Progress Notes (Signed)
*  PRELIMINARY RESULTS* Vascular Ultrasound Duplex Dialysis Access (AVF, AGV) has been completed.  Preliminary findings: Patent access. A small amount of fluid is noted surrounding the access just above AC area.  Landry Mellow, RDMS, RVT  02/09/2014, 10:32 AM

## 2014-02-09 NOTE — Progress Notes (Signed)
MD on call made aware of positive blood cultures. No orders.

## 2014-02-10 ENCOUNTER — Inpatient Hospital Stay (HOSPITAL_COMMUNITY): Payer: Medicare Other

## 2014-02-10 ENCOUNTER — Encounter (HOSPITAL_COMMUNITY): Payer: Self-pay | Admitting: Physician Assistant

## 2014-02-10 DIAGNOSIS — I369 Nonrheumatic tricuspid valve disorder, unspecified: Secondary | ICD-10-CM

## 2014-02-10 DIAGNOSIS — I498 Other specified cardiac arrhythmias: Secondary | ICD-10-CM

## 2014-02-10 LAB — CBC
HCT: 31.8 % — ABNORMAL LOW (ref 39.0–52.0)
HEMOGLOBIN: 10.2 g/dL — AB (ref 13.0–17.0)
MCH: 31.1 pg (ref 26.0–34.0)
MCHC: 32.1 g/dL (ref 30.0–36.0)
MCV: 97 fL (ref 78.0–100.0)
Platelets: 100 10*3/uL — ABNORMAL LOW (ref 150–400)
RBC: 3.28 MIL/uL — ABNORMAL LOW (ref 4.22–5.81)
RDW: 15 % (ref 11.5–15.5)
WBC: 7.2 10*3/uL (ref 4.0–10.5)

## 2014-02-10 LAB — RENAL FUNCTION PANEL
Albumin: 2.2 g/dL — ABNORMAL LOW (ref 3.5–5.2)
Anion gap: 15 (ref 5–15)
BUN: 67 mg/dL — ABNORMAL HIGH (ref 6–23)
CO2: 21 meq/L (ref 19–32)
Calcium: 8.9 mg/dL (ref 8.4–10.5)
Chloride: 102 mEq/L (ref 96–112)
Creatinine, Ser: 7.06 mg/dL — ABNORMAL HIGH (ref 0.50–1.35)
GFR calc non Af Amer: 7 mL/min — ABNORMAL LOW (ref >90)
GFR, EST AFRICAN AMERICAN: 8 mL/min — AB (ref >90)
Glucose, Bld: 124 mg/dL — ABNORMAL HIGH (ref 70–99)
Phosphorus: 5.9 mg/dL — ABNORMAL HIGH (ref 2.3–4.6)
Potassium: 4.3 mEq/L (ref 3.7–5.3)
SODIUM: 138 meq/L (ref 137–147)

## 2014-02-10 LAB — HEPATITIS B SURFACE ANTIGEN: HEP B S AG: NEGATIVE

## 2014-02-10 LAB — TSH: TSH: 2.51 u[IU]/mL (ref 0.350–4.500)

## 2014-02-10 MED ORDER — PENTAFLUOROPROP-TETRAFLUOROETH EX AERO
1.0000 | INHALATION_SPRAY | CUTANEOUS | Status: DC | PRN
Start: 2014-02-10 — End: 2014-02-14

## 2014-02-10 MED ORDER — METOPROLOL SUCCINATE ER 25 MG PO TB24
25.0000 mg | ORAL_TABLET | Freq: Two times a day (BID) | ORAL | Status: DC
  Filled 2014-02-10 (×4): qty 1

## 2014-02-10 MED ORDER — CEFAZOLIN SODIUM-DEXTROSE 2-3 GM-% IV SOLR
2.0000 g | INTRAVENOUS | Status: DC
  Administered 2014-02-10 – 2014-02-14 (×2): 2 g via INTRAVENOUS
  Filled 2014-02-10 (×8): qty 50

## 2014-02-10 MED ORDER — DILTIAZEM HCL 25 MG/5ML IV SOLN
10.0000 mg | Freq: Once | INTRAVENOUS | Status: DC
  Filled 2014-02-10 (×2): qty 5

## 2014-02-10 MED ORDER — LIDOCAINE HCL (PF) 1 % IJ SOLN
5.0000 mL | INTRAMUSCULAR | Status: DC | PRN
Start: 2014-02-10 — End: 2014-02-14

## 2014-02-10 MED ORDER — ALTEPLASE 2 MG IJ SOLR
2.0000 mg | Freq: Once | INTRAMUSCULAR | Status: AC | PRN
  Filled 2014-02-10: qty 2

## 2014-02-10 MED ORDER — METOPROLOL SUCCINATE ER 25 MG PO TB24
25.0000 mg | ORAL_TABLET | Freq: Two times a day (BID) | ORAL | Status: DC
  Administered 2014-02-10 – 2014-02-13 (×6): 25 mg via ORAL
  Filled 2014-02-10 (×10): qty 1

## 2014-02-10 MED ORDER — PARICALCITOL 5 MCG/ML IV SOLN
INTRAVENOUS | Status: AC
  Filled 2014-02-10: qty 1

## 2014-02-10 MED ORDER — SODIUM CHLORIDE 0.9 % IV SOLN
100.0000 mL | INTRAVENOUS | Status: DC | PRN
Start: 2014-02-10 — End: 2014-02-16

## 2014-02-10 MED ORDER — METOPROLOL TARTRATE 25 MG PO TABS
25.0000 mg | ORAL_TABLET | Freq: Two times a day (BID) | ORAL | Status: DC
  Administered 2014-02-10: 25 mg via ORAL
  Filled 2014-02-10 (×2): qty 1

## 2014-02-10 MED ORDER — NEPRO/CARBSTEADY PO LIQD: 237.0000 mL | ORAL | Status: DC | PRN

## 2014-02-10 MED ORDER — IOHEXOL 300 MG/ML  SOLN
80.0000 mL | Freq: Once | INTRAMUSCULAR | Status: AC | PRN
  Administered 2014-02-10: 80 mL via INTRAVENOUS

## 2014-02-10 MED ORDER — LIDOCAINE-PRILOCAINE 2.5-2.5 % EX CREA: 1.0000 "application " | TOPICAL_CREAM | CUTANEOUS | Status: DC | PRN

## 2014-02-10 MED ORDER — METOPROLOL TARTRATE 1 MG/ML IV SOLN
2.5000 mg | INTRAVENOUS | Status: DC | PRN
  Filled 2014-02-10: qty 5

## 2014-02-10 NOTE — Consult Note (Signed)
CARDIOLOGY CONSULT NOTE   Patient ID: Adam Walsh MRN: [id number] DOB/AGE: Mar 24, 1941 73 y.o.  Admit date: 02/08/2014  Primary Physician   Donnie Coffin, MD Primary Cardiologist   Dr. Johnsie Cancel Reason for Consultation   SVT  NWG:NFAOZ [name] is a 73 y.o. male with no history of CAD. He has a history of myasthenia gravis, celiac sprue, ESRD on home HD, HIT. He had a pre-operative evaluation by Dr Johnsie Cancel in 2013, in consideration of a renal transplant. MV was abnormal but cardiac cath showed no significant CAD.   He was admitted 09/12 with weakness and body aches, dx cellulitis at L arm fistula, and myasthenia exacerbation. Blood cx 2/2 were + for S. Aureus. Fistula is patent by [place]. ID has seen and he is on Vanc/cefazolin. 2 D Echo pending.  Pt had HD today and was having multiple runs of SVT (?long RP). Cardiology asked to evaluate him.     Adam Walsh states that he can feel his heart beating fast, but denies chest pain or shortness of breath. No dizziness or presyncopal symptoms. He has slight associated chest pressure that occur occasionally with fast heart rate, but not consistently. States that this is not a pain and is 0/10. Only feels fast heart rate during dialysis and episodes last from a few seconds to a minute at the most. Has felt these episodes ever since starting dialysis 3 1/2 years ago. Wife states that she will take his vitals during dialysis at home and his heart rate will be 150 sometimes. She will immediately recheck and it will be normal. BPs remain the same.  No other triggers identified. Patient allows the fast HR to resolve on its own. He has never been seen before for the NSVT.   Past Medical History  Diagnosis Date  . Hemodialysis patient     Monday, Wednesday and Friday  . HIT (heparin-induced thrombocytopenia)   . Myasthenia gravis   . Hypertension   . History of pyelonephritis   . History of nephrolithiasis   . Celiac sprue   . Hearing deficit       left hearing aid  . Neuropathy     Peripheral neuropathy  . Pancreatic cyst      Past Surgical History  Procedure Laterality Date  . Exploratory laparotomy    . Transurethral resection of prostate    . Cholecystectomy    . Av fistula placement, radiocephalic  30/86/57    Left  . Basilic vein transposition  10/06/10    2nd stage Left arm  . Hematoma evacuation  11/23/10    Left Arm   . Cardiac catheterization  04/2012    nl cors, no LV gram; EF 65% on nuc stress    Allergies  Allergen Reactions  . Avelox [Moxifloxacin Hcl In Nacl]     Pt quit breathing  . Gluten   . Gluten Meal Anaphylaxis  . Ciprofloxacin Other (See Comments)    myasthenia  . Erythromycin Other (See Comments)    myasthenia  . Erythromycin Base Other (See Comments)    Myasthenia gravis  . Gemifloxacin Other (See Comments)    myasthenia  . Gentamicin Other (See Comments)    myasthenia  . Heparin Other (See Comments)    myasthenia  . Kanamycin Other (See Comments)    myasthenia  . Levofloxacin Other (See Comments)    myasthenia  . 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  Neomycin Other (See Comments)    myasthenia  . Norfloxacin Other (See Comments)    myasthenia  . Quinolones Other (See Comments)    Myasthenia gravis  . Streptomycin Other (See Comments)    myasthenia  . Tobramycin Other (See Comments)    myasthenia    I have reviewed the patient's current medications . aspirin  81 mg Oral Daily  . calcium acetate  1,334 mg Oral 5 X Daily  . diltiazem  10 mg Intravenous Once  . ferric gluconate (FERRLECIT/NULECIT) IV  250 mg Intravenous Once  . folic acid  1 mg Oral Daily  . gabapentin  300 mg Oral QHS  . metoprolol tartrate  25 mg Oral BID  . multivitamin  1 tablet Oral QHS  . pantoprazole  40 mg Oral Daily  . paricalcitol      . paricalcitol  2 mcg Intravenous Q M,W,F  . predniSONE  15 mg Oral Q breakfast  . sodium  chloride  3 mL Intravenous Q12H  . vancomycin  1,000 mg Intravenous Q M,W,F-HD     sodium chloride, acetaminophen, alum & mag hydroxide-simeth, HYDROcodone-acetaminophen, [START ON 02/12/2014] Influenza vac split quadrivalent PF, ondansetron (ZOFRAN) IV, sodium chloride  Medication Sig  aspirin 81 MG tablet Take 81 mg by mouth daily.   B Complex-C-Folic Acid (DIALYVITE 627 PO) Take 1 tablet by mouth daily.  calcium acetate (PHOSLO) 667 MG capsule Take 1,334 mg by mouth 5 (five) times daily. With each meal and snack.  clonazePAM (KLONOPIN) 0.5 MG tablet Take 0.5 mg by mouth daily as needed.  EPOETIN ALFA IJ Inject 11,000 Units as directed once a week.   folic acid (FOLVITE) 1 MG tablet Take 1 mg by mouth daily.  gabapentin (NEURONTIN) 300 MG capsule Take 300 mg by mouth at bedtime as needed. For feet/nerve pain  methotrexate (RHEUMATREX) 2.5 MG tablet Take 10 mg by mouth once a week. Caution:Chemotherapy. Protect from light.Take on Thursday.  omeprazole (PRILOSEC) 20 MG capsule Take 20 mg by mouth daily.  paricalcitol (ZEMPLAR) 5 MCG/ML injection Inject 2 mcg into the vein every Monday, Wednesday, and Friday. In home dialysis  predniSONE (DELTASONE) 5 MG tablet Take 3 tablets (15 mg total) by mouth daily with breakfast.     History   Social History  . Marital Status: Married    Spouse Name: N/A    Number of Children: 0  . Years of Education: 8th   Occupational History  . Retired    Social History Main Topics  . Smoking status: Former Smoker    Types: Cigarettes    Quit date: 05/30/1969  . Smokeless tobacco: Never Used  . Alcohol Use: No  . Drug Use: No  . Sexual Activity: Not on file   Other Topics Concern  . Not on file   Social History Narrative   Lives in [place] with wife.          Family Status  Relation Status Death Age  . Mother Deceased 52  . Brother Alive   . Father Deceased 52    Smoke inhalation  . Sister Alive   . Brother Alive   . Brother Alive     Family History  Problem Relation Age of Onset  . Stroke Mother   . Diabetes Mother   . Heart disease Brother   . Dementia Sister      ROS:  Full 14 point review of systems complete and found to be negative unless listed above.  Physical Exam:  Blood pressure 126/66, pulse 91, temperature 98.2 F (36.8 C), temperature source Oral, resp. rate 19, height 5' 10.08" (1.78 m), weight 181 lb 10.5 oz (82.4 kg), SpO2 100.00%.  General: Well developed, well nourished, male in no acute distress Head: Eyes PERRLA, No xanthomas.   Normocephalic and atraumatic, oropharynx without edema or exudate. Dentition:  Lungs: CTA. Heart: HRRR S1 S2, no rub/gallop, Heart regular rate and rhythm with S1, S2  Murmur. Pulse noted to have transient episodes of tachycardia with rates in the 150s. Pulses are 2+ in all 4 extrem.   Neck: No carotid bruits. No lymphadenopathy.  JVD not elevated but difficult to evaluate due to body habitus. Abdomen: Bowel sounds present, abdomen soft and non-tender without masses or hernias noted. Msk:  No spine or cva tenderness. No weakness, no joint deformities or effusions. Extremities: No clubbing or cyanosis. No edema.  Neuro: Alert and oriented X 3. No focal deficits noted. Psych:  Good affect, responds appropriately Skin: No rashes or lesions noted.  Labs:   Lab Results  Component Value Date   WBC 7.2 02/10/2014   HGB 10.2* 02/10/2014   HCT 31.8* 02/10/2014   MCV 97.0 02/10/2014   PLT 100* 02/10/2014   No results found for this basename: INR,  in the last 72 hours  Recent Labs Lab 02/08/14 0645  02/10/14 0758  NA 139  < > 138  K 4.2  < > 4.3  CL 100  < > 102  CO2 25  < > 21  BUN 45*  < > 67*  CREATININE 5.52*  < > 7.06*  CALCIUM 9.4  < > 8.9  PROT 6.0  --   --   BILITOT 0.6  --   --   ALKPHOS 56  --   --   ALT 16  --   --   AST 18  --   --   GLUCOSE 101*  < > 124*  ALBUMIN 2.9*  --  2.2*  < > = values in this interval not displayed.  Echo:  pending  ECG:  02/10/2014 SR, no acute ischemic changes Vent. rate 82 BPM PR interval 140 ms QRS duration 88 ms QT/QTc 360/420 ms P-R-T axes 54 41 56  Radiology:  Dg Chest Port 1 View 02/08/2014   CLINICAL DATA:  Vascular access problem.  Generalized body aches.  EXAM: PORTABLE CHEST - 1 VIEW  COMPARISON:  None 10/2012  FINDINGS: The heart size and mediastinal contours are within normal limits. Both lungs are clear. Surgical clips near the gastroesophageal junction. The visualized skeletal structures are unremarkable.  IMPRESSION: No active disease.   Electronically Signed   By: Curlene Dolphin M.D.   On: 02/08/2014 07:52    ASSESSMENT AND PLAN:   The patient was seen today by Dr. Aundra Dubin, the patient evaluated and the data reviewed.  Principal Problem:   Staphylococcus aureus bacteremia- per IM Active Problems:   Atrial tachycardia: Seems to respond to valsalva. Was started on Lopressor 25mg  BID today. Would change to Toprol XL 25mg  BID   End stage renal disease on dialysis- per IM and nephrology   HIT (heparin-induced thrombocytopenia)   Hypertension   Celiac sprue   Cellulitis of arm, left   Rheumatoid arthritis   Sepsis   Myasthenia exacerbation   Macrocytic anemia   Signed: Rosaria Ferries, PA-C 02/10/2014 1:11 PM Beeper [phone]  Co-Sign MD  Patient seen with PA, agree with the above note.  Patient has had runs of tachypalpitations for  years now, worse when he is getting hemodialysis.  He thinks he has had more for the last 3-4 months.  On telemetry today, he has had multiple runs of long R-P SVT, possible atrial tachycardia.  He has not been on beta blockers or calcium channel blockers.  I will start him at this time on Toprol XL 25 mg bid.  Will see if this suppresses the arrhythmia.  If not, can titrate up or add diltiazem CD.  He will need an echo to make sure that EF is preserved.   Loralie Champagne 02/10/2014 1:45 PM

## 2014-02-10 NOTE — Progress Notes (Signed)
  Echocardiogram 2D Echocardiogram has been performed.  Adam Walsh M 02/10/2014, 1:55 PM

## 2014-02-10 NOTE — Progress Notes (Addendum)
Patient Demographics  Adam Walsh, is a 73 y.o. male, DOB - [birth year], CVE:[id number]  Admit date - 02/08/2014   Admitting Physician Debbe Odea, MD  Outpatient Primary MD for the patient is Donnie Coffin, MD  LOS - 2   Chief Complaint  Patient presents with  . Vascular Access Problem  . Generalized Body Aches        Subjective:   Adam Walsh today has, No headache, No chest pain, No abdominal pain - No Nausea, No new weakness tingling or numbness, No Cough - SOB. Feels much better.    Assessment & Plan    1. Sepsis with cellulitis and possible abscess of left arm AV fistula site. Blood cultures noted for possible staph aureus, IV vancomycin per pharmacy, vascular surgery and renal consulted. His redness is somewhat improved, clinically he feels better on IV vancomycin, CT of the left arm ordered by vascular surgery is pending.     2. ESRD. Dialyzes 4 times a week at home, renal consulted.     3. Myasthenia gravis and rheumatoid arthritis. Currently says he does not feel that his weakness is worse, on prednisone 15 mg which will be continued, methotrexate on hold till sepsis is improved. Outpatient rheumatology followup once better. Will  have PT OT follow and monitor.     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      5. History of gluten sensitivity. On gluten free diet.     6. Addendum. Patient and dialysis. Patient went into SVT. We'll check EKG, echo, TSH, Cardizem 10 mg IV now then by mouth Lopressor. Cardiology consulted.     Code Status: DO NOT RESUSCITATE  Family Communication: None present  Disposition Plan: Home   Procedures left fistula site ultrasound showing some fluid collection. Chest x-ray unremarkable.  L arm CT pending, TTE   Consults   Renal, Vas Surgery, Cards   Medications  Scheduled Meds: . aspirin  81 mg Oral Daily  . calcium acetate  1,334 mg Oral 5 X Daily  . ferric gluconate (FERRLECIT/NULECIT) IV  250 mg Intravenous Once  . folic acid  1 mg Oral Daily  . gabapentin  300 mg Oral QHS  . multivitamin  1 tablet Oral QHS  . pantoprazole  40 mg Oral Daily  . paricalcitol  2 mcg Intravenous Q M,W,F  . predniSONE  15 mg Oral Q breakfast  . sodium chloride  3 mL Intravenous Q12H  . vancomycin  1,000 mg Intravenous Q M,W,F-HD   Continuous Infusions:  PRN Meds:.sodium chloride, acetaminophen, acetaminophen, alum & mag hydroxide-simeth, HYDROcodone-acetaminophen, [START ON 02/12/2014] Influenza vac split quadrivalent PF, ondansetron (ZOFRAN) IV, sodium chloride  DVT Prophylaxis  SCD ( H/O possible HIT)  Lab Results  Component Value Date   PLT 100* 02/10/2014    Antibiotics     Anti-infectives   Start     Dose/Rate Route Frequency Ordered Stop   02/10/14 1200  vancomycin (VANCOCIN) IVPB 1000 mg/200 mL premix     1,000 mg 200 mL/hr over 60 Minutes Intravenous Every M-W-F (Hemodialysis) 02/09/14 1229     02/09/14 1600  vancomycin (VANCOCIN) IVPB 750 mg/150 ml premix     750 mg 150 mL/hr over 60 Minutes Intravenous  Once 02/09/14 1459 02/09/14  1813   02/09/14 1500  ceFAZolin (ANCEF) IVPB 2 g/50 mL premix     2 g 100 mL/hr over 30 Minutes Intravenous  Once 02/09/14 1434 02/09/14 1626   02/08/14 0645  vancomycin (VANCOCIN) IVPB 1000 mg/200 mL premix     1,000 mg 200 mL/hr over 60 Minutes Intravenous  Once 02/08/14 0644 02/08/14 0851   02/08/14 0645  piperacillin-tazobactam (ZOSYN) IVPB 2.25 g     2.25 g 100 mL/hr over 30 Minutes Intravenous  Once 02/08/14 0644 02/08/14 0932          Objective:   Filed Vitals:   02/10/14 0800 02/10/14 0830 02/10/14 0900 02/10/14 0930  BP: 111/65 123/66 122/70 132/66  Pulse: 71 77 82 77  Temp:      TempSrc:      Resp: 15 15 20 21   Height:      Weight:      SpO2:         Wt Readings from Last 3 Encounters:  02/10/14 83 kg (182 lb 15.7 oz)  12/30/13 86.183 kg (190 lb)  11/15/13 85.276 kg (188 lb)     Intake/Output Summary (Last 24 hours) at 02/10/14 1003 Last data filed at 02/10/14 0600  Gross per 24 hour  Intake    510 ml  Output      0 ml  Net    510 ml     Physical Exam  Awake Alert, Oriented X 3, No new F.N deficits, Normal affect Varina.AT,PERRAL Supple Neck,No JVD, No cervical lymphadenopathy appriciated.  Symmetrical Chest wall movement, Good air movement bilaterally, CTAB RRR,No Gallops,Rubs or new Murmurs, No Parasternal Heave +ve B.Sounds, Abd Soft, No tenderness, No organomegaly appriciated, No rebound - guarding or rigidity. No Cyanosis, Clubbing or edema, No new Rash or bruise ,  L arm fistula site minimal erythema and warmth     Data Review   Micro Results Recent Results (from the past 240 hour(s))  CULTURE, BLOOD (ROUTINE X 2)     Status: None   Collection Time    02/08/14  6:29 AM      Result Value Ref Range Status   Specimen Description BLOOD RIGHT ARM   Final   Special Requests BOTTLES DRAWN AEROBIC AND ANAEROBIC 5CC   Final   Culture  Setup Time     Final   Value: 02/08/2014 15:16     Performed at [facility]   Culture     Final   Value: STAPHYLOCOCCUS AUREUS     Note: Gram Stain Report Called to,Read Back By and Verified With: Sima Matas RN on 02/09/14 at 02:00 by Rise Mu     Performed at [facility]   Report Status PENDING   Incomplete  CULTURE, BLOOD (ROUTINE X 2)     Status: None   Collection Time    02/08/14  6:45 AM      Result Value Ref Range Status   Specimen Description BLOOD RIGHT ARM   Final   Special Requests BOTTLES DRAWN AEROBIC AND ANAEROBIC [facility]   Final   Culture  Setup Time     Final   Value: 02/08/2014 15:18     Performed at [facility]   Culture     Final   Value: STAPHYLOCOCCUS AUREUS     Note: Gram Stain Report Called to,Read Back By and Verified With:  Sima Matas RN on 02/09/14 at 02:00 by Rise Mu     Performed at [facility]  Report Status PENDING   Incomplete    Radiology Reports Dg Chest Port 1 View  02/08/2014   CLINICAL DATA:  Vascular access problem.  Generalized body aches.  EXAM: PORTABLE CHEST - 1 VIEW  COMPARISON:  None 10/2012  FINDINGS: The heart size and mediastinal contours are within normal limits. Both lungs are clear. Surgical clips near the gastroesophageal junction. The visualized skeletal structures are unremarkable.  IMPRESSION: No active disease.   Electronically Signed   By: Curlene Dolphin M.D.   On: 02/08/2014 07:52     CBC  Recent Labs Lab 02/08/14 0645 02/09/14 1453 02/10/14 0757  WBC 11.0* 11.5* 7.2  HGB 12.5* 11.1* 10.2*  HCT 39.6 35.7* 31.8*  PLT 102* 98* 100*  MCV 101.8* 98.6 97.0  MCH 32.1 30.7 31.1  MCHC 31.6 31.1 32.1  RDW 15.2 15.1 15.0  LYMPHSABS 0.8  --   --   MONOABS 0.1  --   --   EOSABS 0.1  --   --   BASOSABS 0.0  --   --     Chemistries   Recent Labs Lab 02/08/14 0645 02/09/14 1453 02/10/14 0758  NA 139 135* 138  K 4.2 5.6* 4.3  CL 100 97 102  CO2 25 24 21   GLUCOSE 101* 211* 124*  BUN 45* 66* 67*  CREATININE 5.52* 7.50* 7.06*  CALCIUM 9.4 9.3 8.9  AST 18  --   --   ALT 16  --   --   ALKPHOS 56  --   --   BILITOT 0.6  --   --    ------------------------------------------------------------------------------------------------------------------ estimated creatinine clearance is 9.9 ml/min (by C-G formula based on Cr of 7.06). ------------------------------------------------------------------------------------------------------------------ No results found for this basename: HGBA1C,  in the last 72 hours ------------------------------------------------------------------------------------------------------------------ No results found for this basename: CHOL, HDL, LDLCALC, TRIG, CHOLHDL, LDLDIRECT,  in the last 72  hours ------------------------------------------------------------------------------------------------------------------ No results found for this basename: TSH, T4TOTAL, FREET3, T3FREE, THYROIDAB,  in the last 72 hours ------------------------------------------------------------------------------------------------------------------ No results found for this basename: VITAMINB12, FOLATE, FERRITIN, TIBC, IRON, RETICCTPCT,  in the last 72 hours  Coagulation profile No results found for this basename: INR, PROTIME,  in the last 168 hours  No results found for this basename: DDIMER,  in the last 72 hours  Cardiac Enzymes No results found for this basename: CK, CKMB, TROPONINI, MYOGLOBIN,  in the last 168 hours ------------------------------------------------------------------------------------------------------------------ No components found with this basename: POCBNP,      Time Spent in minutes   35   Lala Lund K M.D on 02/10/2014 at 10:03 AM  Between 7am to 7pm - Pager - [phone]  After 7pm go to [email or web address] - password [email or web address]  And look for the night coverage person covering for me after hours  [facility] Office  [phone]   **Disclaimer: This note may have been dictated with voice recognition software. Similar sounding words can inadvertently be transcribed and this note may contain transcription errors which may not have been corrected upon publication of note.**

## 2014-02-10 NOTE — Progress Notes (Signed)
OT Cancellation Note  Patient Details Name: Adam Walsh MRN: [id number] DOB: 07-18-1940   Cancelled Treatment:    Reason Eval/Treat Not Completed: OT screened, no needs identified, will sign off.  Pt/wife do not feel pt will need OT.  He has been walking to bathroom and wife will assist as needed with ADLs due to infected graph.  They have a high commode, shower stool and grab bar for tub.    Kateland Leisinger 02/10/2014, 2:50 PM Lesle Chris, OTR/L [phone] 02/10/2014

## 2014-02-10 NOTE — Progress Notes (Signed)
UR completed. Jaicob Dia RN CCM Case Mgmt phone [phone] 

## 2014-02-10 NOTE — Procedures (Signed)
Patient was seen on dialysis and the procedure was supervised. BFR 350 Via LUE AVF BP is 111/65.  Patient appears to be tolerating treatment well.  Redness surrounding AVF appears to be receeding.  Plan per [facility].

## 2014-02-10 NOTE — Progress Notes (Signed)
   Daily Progress Note  Assessment/Planning: L arm cellulitis, possible infected L BVT   Arm looks better today  Cont IV abx  L arm CT pending to better evaluate the "fluid" collection over the access  Subjective   No complaints this AM  Objective Filed Vitals:   02/09/14 2124 02/10/14 0540 02/10/14 0738 02/10/14 0800  BP: 118/55 138/66 141/72 111/65  Pulse:  75 71 71  Temp: 98.2 F (36.8 C) 98.7 F (37.1 C) 97.6 F (36.4 C)   TempSrc: Oral  Oral   Resp:  18 14 15   Height:      Weight:   182 lb 15.7 oz (83 kg)   SpO2: 98% 98% 98%     Intake/Output Summary (Last 24 hours) at 02/10/14 0843 Last data filed at 02/10/14 0600  Gross per 24 hour  Intake    750 ml  Output      0 ml  Net    750 ml   VASC  L antecubitum less erythematous today with less TTP  Laboratory CBC    Component Value Date/Time   WBC 7.2 02/10/2014 0757   WBC 9.6 08/19/2013 1358   HGB 10.2* 02/10/2014 0757   HCT 31.8* 02/10/2014 0757   PLT 100* 02/10/2014 0757    BMET    Component Value Date/Time   NA 138 02/10/2014 0758   NA 144 08/19/2013 1358   K 4.3 02/10/2014 0758   CL 102 02/10/2014 0758   CO2 21 02/10/2014 0758   GLUCOSE 124* 02/10/2014 0758   GLUCOSE 134* 08/19/2013 1358   BUN 67* 02/10/2014 0758   BUN 49* 08/19/2013 1358   CREATININE 7.06* 02/10/2014 0758   CALCIUM 8.9 02/10/2014 0758   GFRNONAA 7* 02/10/2014 0758   GFRAA 8* 02/10/2014 0758    Adele Barthel, MD [facility] Office: [phone] Pager: [phone]  02/10/2014, 8:43 AM

## 2014-02-10 NOTE — Progress Notes (Signed)
Nutrition Brief Note  Dietitian was consulted regarding pt's gluten free diet. Spoke with pt, pt is satisfied with his diet. Meal completion is 80-100%. Pt has no further questions/needs about his diet and/or celiac disease.   No nutrition intervention at this time. If nutritional issues arise, please re-consult RD.  Kallie Locks, MS, Provisional LDN Pager # [phone] After hours/ weekend pager # [phone]

## 2014-02-10 NOTE — Progress Notes (Signed)
OT Cancellation Note  Patient Details Name: Adam Walsh MRN: [id number] DOB: 06-29-1940   Cancelled Treatment:    Reason Eval/Treat Not Completed: Other (comment)  Pt is in HD. Will check back later if schedule permits.  Yolander Goodie 02/10/2014, 8:05 AM Lesle Chris, OTR/L [phone] 02/10/2014

## 2014-02-10 NOTE — Progress Notes (Signed)
Patient episodic of SVTs earlier today, called Dr. Candiss Norse, gave orders for Lopressor 25 mg, diltiazem 10 mg IV, 12 lead EKG, 2D Echo.  Patient's IV unable to flush; called IV team.  Rosaria Ferries PA-C was checking the patient and told this writer to hold Diltiazem because Lopressor was already given.  Currently, 78 NSR resting in his bed.

## 2014-02-10 NOTE — Progress Notes (Signed)
Jamaar Howes Barnett RD, LDN Inpatient Clinical Dietitian Pager: [phone] After Hours Pager: [phone]  

## 2014-02-10 NOTE — Progress Notes (Signed)
Physical Therapy Treatment Patient Details Name: Adam Walsh MRN: [id number] DOB: [birth year] Today's Date: 02/10/2014    History of Present Illness HPI: Adam Walsh is a 73 y.o. male the old with past medical history of end-stage renal disease on hemodialysis, myasthenia gravis, rheumatoid arthritis, HIT. The patient underwent dialysis day before admission which was uneventful and later in the evening noticed redness around his fistula. By this morning he was having fevers and generalized weakness along with muscle aches and joint pains.he decided to come to the ER where it was noted that his fistula appeared to be infected. He was evaluated with an ultrasound and is negative for an abscess. He is being admitted for this infection.     PT Comments    Patient agreeable to ambulation. Somewhat upset as he has been waiting for CT Scan since yesterday. Wife present in room. Unsure of intervention for infection at this time. Will follow up to see if still need for HHPT or RW.   Follow Up Recommendations  Home health PT;Supervision/Assistance - 24 hour     Equipment Recommendations  Rolling walker with 5" wheels;3in1 (PT)    Recommendations for Other Services       Precautions / Restrictions Precautions Precautions: Fall    Mobility  Bed Mobility               General bed mobility comments: Patient sitting up in recliner before and after session  Transfers Overall transfer level: Needs assistance Equipment used: None   Sit to Stand: Supervision         General transfer comment: supervision for safety  Ambulation/Gait Ambulation/Gait assistance: Min guard Ambulation Distance (Feet): 200 Feet Assistive device: None Gait Pattern/deviations: Step-through pattern;Decreased stride length   Gait velocity interpretation: Below normal speed for age/gender General Gait Details: No HHA required this session. At times patient reaching out for railing but did not appear  as for support.    Stairs            Wheelchair Mobility    Modified Rankin (Stroke Patients Only)       Balance                                    Cognition Arousal/Alertness: Awake/alert Behavior During Therapy: WFL for tasks assessed/performed Overall Cognitive Status: Within Functional Limits for tasks assessed                      Exercises      General Comments        Pertinent Vitals/Pain Pain Assessment: No/denies pain    Home Living                      Prior Function            PT Goals (current goals can now be found in the care plan section) Progress towards PT goals: Progressing toward goals    Frequency  Min 3X/week    PT Plan Current plan remains appropriate    Co-evaluation             End of Session Equipment Utilized During Treatment: Gait belt Activity Tolerance: Patient tolerated treatment well Patient left: in chair;with call bell/phone within reach;with family/visitor present     Time: 8242-3536 PT Time Calculation (min): 21 min  Charges:  $Gait Training: 8-22 mins  G Codes:      Adam Walsh 02/10/2014, 2:57 PM 02/10/2014 Adam Walsh [phone] pager [phone] office

## 2014-02-10 NOTE — Progress Notes (Signed)
INFECTIOUS DISEASE PROGRESS NOTE  ID: Adam Walsh is a 73 y.o. male with  Principal Problem:   Staphylococcus aureus bacteremia Active Problems:   End stage renal disease on dialysis   HIT (heparin-induced thrombocytopenia)   Hypertension   Celiac sprue   Cellulitis of arm, left   Rheumatoid arthritis   Sepsis   Myasthenia exacerbation   Macrocytic anemia  Subjective: No exam, pt in imaging.   Abtx:  Anti-infectives   Start     Dose/Rate Route Frequency Ordered Stop   02/10/14 1400  ceFAZolin (ANCEF) IVPB 2 g/50 mL premix     2 g 100 mL/hr over 30 Minutes Intravenous Every M-W-F (Hemodialysis) 02/10/14 1353     02/10/14 1200  vancomycin (VANCOCIN) IVPB 1000 mg/200 mL premix     1,000 mg 200 mL/hr over 60 Minutes Intravenous Every M-W-F (Hemodialysis) 02/09/14 1229     02/09/14 1600  vancomycin (VANCOCIN) IVPB 750 mg/150 ml premix     750 mg 150 mL/hr over 60 Minutes Intravenous  Once 02/09/14 1459 02/09/14 1813   02/09/14 1500  ceFAZolin (ANCEF) IVPB 2 g/50 mL premix     2 g 100 mL/hr over 30 Minutes Intravenous  Once 02/09/14 1434 02/09/14 1626   02/08/14 0645  vancomycin (VANCOCIN) IVPB 1000 mg/200 mL premix     1,000 mg 200 mL/hr over 60 Minutes Intravenous  Once 02/08/14 0644 02/08/14 0851   02/08/14 0645  piperacillin-tazobactam (ZOSYN) IVPB 2.25 g     2.25 g 100 mL/hr over 30 Minutes Intravenous  Once 02/08/14 0644 02/08/14 0932      Medications:  Scheduled: . aspirin  81 mg Oral Daily  . calcium acetate  1,334 mg Oral 5 X Daily  .  ceFAZolin (ANCEF) IV  2 g Intravenous Q M,W,F-HD  . ferric gluconate (FERRLECIT/NULECIT) IV  250 mg Intravenous Once  . folic acid  1 mg Oral Daily  . gabapentin  300 mg Oral QHS  . metoprolol succinate  25 mg Oral BID  . multivitamin  1 tablet Oral QHS  . pantoprazole  40 mg Oral Daily  . paricalcitol      . paricalcitol  2 mcg Intravenous Q M,W,F  . predniSONE  15 mg Oral Q breakfast  . sodium chloride  3 mL  Intravenous Q12H  . vancomycin  1,000 mg Intravenous Q M,W,F-HD    Objective: Vital signs in last 24 hours: Temp:  [97.6 F (36.4 C)-99.6 F (37.6 C)] 98.2 F (36.8 C) (09/14 1122) Pulse Rate:  [71-91] 91 (09/14 1122) Resp:  [14-23] 19 (09/14 1122) BP: (109-141)/(55-72) 111/66 mmHg (09/14 1300) SpO2:  [98 %-100 %] 100 % (09/14 1122) Weight:  [82.4 kg (181 lb 10.5 oz)-83 kg (182 lb 15.7 oz)] 82.4 kg (181 lb 10.5 oz) (09/14 1122)   no exam, pt in imaging.   Lab Results  Recent Labs  02/09/14 1453 02/10/14 0757 02/10/14 0758  WBC 11.5* 7.2  --   HGB 11.1* 10.2*  --   HCT 35.7* 31.8*  --   NA 135*  --  138  K 5.6*  --  4.3  CL 97  --  102  CO2 24  --  21  BUN 66*  --  67*  CREATININE 7.50*  --  7.06*   Liver Panel  Recent Labs  02/08/14 0645 02/10/14 0758  PROT 6.0  --   ALBUMIN 2.9* 2.2*  AST 18  --   ALT 16  --   ALKPHOS  56  --   BILITOT 0.6  --    Sedimentation Rate No results found for this basename: ESRSEDRATE,  in the last 72 hours C-Reactive Protein No results found for this basename: CRP,  in the last 72 hours  Microbiology: Recent Results (from the past 240 hour(s))  CULTURE, BLOOD (ROUTINE X 2)     Status: None   Collection Time    02/08/14  6:29 AM      Result Value Ref Range Status   Specimen Description BLOOD RIGHT ARM   Final   Special Requests BOTTLES DRAWN AEROBIC AND ANAEROBIC 5CC   Final   Culture  Setup Time     Final   Value: 02/08/2014 15:16     Performed at [facility]   Culture     Final   Value: STAPHYLOCOCCUS AUREUS     Note: Gram Stain Report Called to,Read Back By and Verified With: Sima Matas RN on 02/09/14 at 02:00 by Rise Mu     Performed at [facility]   Report Status PENDING   Incomplete  CULTURE, BLOOD (ROUTINE X 2)     Status: None   Collection Time    02/08/14  6:45 AM      Result Value Ref Range Status   Specimen Description BLOOD RIGHT ARM   Final   Special Requests BOTTLES DRAWN  AEROBIC AND ANAEROBIC [facility]   Final   Culture  Setup Time     Final   Value: 02/08/2014 15:18     Performed at [facility]   Culture     Final   Value: STAPHYLOCOCCUS AUREUS     Note: RIFAMPIN AND GENTAMICIN SHOULD NOT BE USED AS SINGLE DRUGS FOR TREATMENT OF STAPH INFECTIONS.     Note: Gram Stain Report Called to,Read Back By and Verified With: Sima Matas RN on 02/09/14 at 02:00 by Rise Mu     Performed at [facility]   Report Status PENDING   Incomplete    Studies/Results: No results found.   Assessment/Plan: Staph/MSSA bacteremia ESRD Fistula infection? RA on prednisone, methotrexate on hold  Total days of antibiotics: 3 vanco/ancef  Fever better Repeat BCx 9-13 are pending sensi on BCx from 9-12 are pending.  Consider TEE He is currently having his arm/graft imaged.           Adam Walsh Infectious Diseases (pager) [phone] [email or web address] 02/10/2014, 3:21 PM  LOS: 2 days

## 2014-02-11 DIAGNOSIS — I471 Supraventricular tachycardia: Secondary | ICD-10-CM

## 2014-02-11 LAB — CULTURE, BLOOD (ROUTINE X 2)

## 2014-02-11 MED ORDER — SODIUM CHLORIDE 0.9 % IV SOLN
INTRAVENOUS | Status: DC
  Administered 2014-02-12 (×2): via INTRAVENOUS

## 2014-02-11 MED ORDER — ASPIRIN 81 MG PO CHEW
81.0000 mg | CHEWABLE_TABLET | Freq: Every day | ORAL | Status: DC
  Administered 2014-02-11 – 2014-02-16 (×6): 81 mg via ORAL
  Filled 2014-02-11 (×6): qty 1

## 2014-02-11 MED ORDER — DARBEPOETIN ALFA-POLYSORBATE 60 MCG/0.3ML IJ SOLN
60.0000 ug | INTRAMUSCULAR | Status: DC
  Administered 2014-02-12: 60 ug via INTRAVENOUS
  Filled 2014-02-11: qty 0.3

## 2014-02-11 NOTE — Progress Notes (Signed)
Subjective: No dizziness,, CP, SOB  Objective: Vital signs in last 24 hours: Temp:  [97.6 F (36.4 C)-98.7 F (37.1 C)] 97.6 F (36.4 C) (09/15 1000) Pulse Rate:  [57-69] 58 (09/15 1000) Resp:  [16-18] 16 (09/15 1000) BP: (105-124)/(57-66) 124/57 mmHg (09/15 1000) SpO2:  [95 %-99 %] 99 % (09/15 1000) Weight:  [181 lb 10.5 oz (82.399 kg)] 181 lb 10.5 oz (82.399 kg) (09/14 2021) Last BM Date: 02/12/15 (per patient at 7am)  Intake/Output from previous day: 09/14 0701 - 09/15 0700 In: 120 [P.O.:120] Out: 1465  Intake/Output this shift: Total I/O In: 240 [P.O.:240] Out: -   Medications Current Facility-Administered Medications  Medication Dose Route Frequency Provider Last Rate Last Dose  . 0.9 %  sodium chloride infusion  250 mL Intravenous PRN Debbe Odea, MD      . 0.9 %  sodium chloride infusion  100 mL Intravenous PRN Marlena Clipper, NP      . 0.9 %  sodium chloride infusion  100 mL Intravenous PRN Marlena Clipper, NP      . acetaminophen (TYLENOL) tablet 650 mg  650 mg Oral Q6H PRN Debbe Odea, MD   650 mg at 02/09/14 1540  . alum & mag hydroxide-simeth (MAALOX/MYLANTA) 200-200-20 MG/5ML suspension 30 mL  30 mL Oral Q6H PRN Debbe Odea, MD      . aspirin chewable tablet 81 mg  81 mg Oral Daily Thurnell Lose, MD   81 mg at 02/11/14 0949  . calcium acetate (PHOSLO) capsule 1,334 mg  1,334 mg Oral 5 X Daily Debbe Odea, MD   1,334 mg at 02/11/14 0819  . ceFAZolin (ANCEF) IVPB 2 g/50 mL premix  2 g Intravenous Q M,W,F-HD Manley Mason, RPH   2 g at 02/10/14 1448  . [START ON 02/12/2014] darbepoetin (ARANESP) injection 60 mcg  60 mcg Intravenous Q Wed-HD Marlena Clipper, NP      . feeding supplement (NEPRO CARB STEADY) liquid 237 mL  237 mL Oral PRN Marlena Clipper, NP      . folic acid (FOLVITE) tablet 1 mg  1 mg Oral Daily Debbe Odea, MD   1 mg at 02/11/14 0949  . gabapentin (NEURONTIN) capsule 300 mg  300 mg Oral QHS Debbe Odea, MD   300 mg at  02/10/14 2344  . HYDROcodone-acetaminophen (NORCO/VICODIN) 5-325 MG per tablet 1 tablet  1 tablet Oral Q6H PRN Thurnell Lose, MD      . Derrill Memo ON 02/12/2014] Influenza vac split quadrivalent PF (FLUARIX) injection 0.5 mL  0.5 mL Intramuscular Prior to discharge Thurnell Lose, MD      . lidocaine (PF) (XYLOCAINE) 1 % injection 5 mL  5 mL Intradermal PRN Marlena Clipper, NP      . lidocaine-prilocaine (EMLA) cream 1 application  1 application Topical PRN Marlena Clipper, NP      . metoprolol (LOPRESSOR) injection 2.5 mg  2.5 mg Intravenous Q5 min PRN Rhonda G Barrett, PA-C      . metoprolol succinate (TOPROL-XL) 24 hr tablet 25 mg  25 mg Oral BID Thurnell Lose, MD   25 mg at 02/10/14 2343  . multivitamin (RENA-VIT) tablet 1 tablet  1 tablet Oral QHS Marlena Clipper, NP   1 tablet at 02/10/14 2344  . ondansetron (ZOFRAN) injection 4 mg  4 mg Intravenous Q6H PRN Debbe Odea, MD      . pantoprazole (PROTONIX) EC tablet 40 mg  40 mg Oral  Daily Debbe Odea, MD   40 mg at 02/11/14 0949  . pentafluoroprop-tetrafluoroeth (GEBAUERS) aerosol 1 application  1 application Topical PRN Marlena Clipper, NP      . predniSONE (DELTASONE) tablet 15 mg  15 mg Oral Q breakfast Debbe Odea, MD   15 mg at 02/11/14 0949  . sodium chloride 0.9 % injection 3 mL  3 mL Intravenous Q12H Debbe Odea, MD   3 mL at 02/11/14 0950  . sodium chloride 0.9 % injection 3 mL  3 mL Intravenous PRN Debbe Odea, MD   3 mL at 02/09/14 0931    PE: General appearance: alert, cooperative and no distress.  Sitting up in chair. Lungs: clear to auscultation bilaterally Heart: regular rate and rhythm, S1, S2 normal, no murmur, click, rub or gallop Extremities: No LEE Pulses: 2+ and symmetric Skin: Warm and dry.  Left forearm with erythema and very warm Neurologic: Grossly normal  Lab Results:   Recent Labs  02/09/14 1453 02/10/14 0757  WBC 11.5* 7.2  HGB 11.1* 10.2*  HCT 35.7* 31.8*  PLT 98* 100*    BMET  Recent Labs  02/09/14 1453 02/10/14 0758  NA 135* 138  K 5.6* 4.3  CL 97 102  CO2 24 21  GLUCOSE 211* 124*  BUN 66* 67*  CREATININE 7.50* 7.06*  CALCIUM 9.3 8.9     Assessment/Plan  Principal Problem:   Staphylococcus aureus bacteremia Active Problems:   End stage renal disease on dialysis   HIT (heparin-induced thrombocytopenia)   Hypertension   Celiac sprue   Cellulitis of arm, left   Rheumatoid arthritis   Sepsis   Myasthenia exacerbation   Macrocytic anemia   SVT  Plan:   After 1430hrs, first dose of toprol given, the patient's HR calmed down.  He has maintained NSR/sinus brady(50's) since.  He had one 3 beat NSVT.  BP stable.  Echo:  EF 55-60%, normal wall motion, G1DD.  No changes.  FU with Dr. Johnsie Cancel or APP.   LOS: 3 days    HAGER, BRYAN PA-C 02/11/2014 11:30 AM  Personally seen and examined. Agree with above. Continue metoprolol. SVT resolved. Normal EF.  Will sign off please call if concerns arise.  Candee Furbish, MD

## 2014-02-11 NOTE — Progress Notes (Addendum)
   Daily Progress Note  Assessment/Planning: L arm cellulitis, Likely button hole abscess   There is not an obvious superficial abscess at the distal button hole. I will plan on draining this in OR due to proximity to fistula.  HD is hesistant to cannula the L fistula while actively infected, so Dr. Marval Regal req. TDC placement.  Also reportedly high arterial pressures during cannulation so L arm fistulogram recommended.  If possible will try to do this also tomorrow, depending on the extent of cellulitis involvement  Subjective    Feeling better  Objective Filed Vitals:   02/10/14 2021 02/11/14 0440 02/11/14 0800 02/11/14 1000  BP: 105/64 111/60 112/58 124/57  Pulse: 69 57 57 58  Temp: 98.7 F (37.1 C) 98.2 F (36.8 C) 98.5 F (36.9 C) 97.6 F (36.4 C)  TempSrc: Oral Oral Oral Oral  Resp: 18 18 18 16   Height:      Weight: 181 lb 10.5 oz (82.399 kg)     SpO2: 95% 95% 95% 99%    Intake/Output Summary (Last 24 hours) at 02/11/14 1148 Last data filed at 02/11/14 0900  Gross per 24 hour  Intake    360 ml  Output      0 ml  Net    360 ml    VASC  Frank superificial abscess from distal button visible, + thrill and bruit, cellulitis improved  Laboratory CBC    Component Value Date/Time   WBC 7.2 02/10/2014 0757   WBC 9.6 08/19/2013 1358   HGB 10.2* 02/10/2014 0757   HCT 31.8* 02/10/2014 0757   PLT 100* 02/10/2014 0757    BMET    Component Value Date/Time   NA 138 02/10/2014 0758   NA 144 08/19/2013 1358   K 4.3 02/10/2014 0758   CL 102 02/10/2014 0758   CO2 21 02/10/2014 0758   GLUCOSE 124* 02/10/2014 0758   GLUCOSE 134* 08/19/2013 1358   BUN 67* 02/10/2014 0758   BUN 49* 08/19/2013 1358   CREATININE 7.06* 02/10/2014 0758   CALCIUM 8.9 02/10/2014 0758   GFRNONAA 7* 02/10/2014 0758   GFRAA 8* 02/10/2014 [place], MD [facility] Office: [phone] Pager: [phone]  02/11/2014, 11:48 AM

## 2014-02-11 NOTE — Progress Notes (Signed)
Subjective:   Frustrated arm doesn't look any better.  Objective Filed Vitals:   02/10/14 2021 02/11/14 0440 02/11/14 0800 02/11/14 1000  BP: 105/64 111/60 112/58 124/57  Pulse: 69 57 57 58  Temp: 98.7 F (37.1 C) 98.2 F (36.8 C) 98.5 F (36.9 C) 97.6 F (36.4 C)  TempSrc: Oral Oral Oral Oral  Resp: 18 18 18 16   Height:      Weight: 82.399 kg (181 lb 10.5 oz)     SpO2: 95% 95% 95% 99%   Physical Exam General: alert and oriented  Heart:  RRR Lungs: CTA, unlabored  Abdomen: soft, nontender +BS  Extremities: no edema Dialysis Access:  L AVF +b/t erythema and warmth. Distal button hole raised, non drainage  Dialysis Orders:  Home HD using NxStage with PureFlow 4X Week, MonWedFriSat, CAR 172, Therapy Fluid 1.0 K 45 Lactate, Volume per Tx 25 (liters), Flow Fraction 45 %, BFR 400, EDW 84  Epogen- 11,000u q week  Venofer 300mg  once 9/15  Paricalcititol 2 mcg 3x week  Assessment/Plan:  1. Cellulitis- . Blood cultures +Staph. Ancef per pharmacy. Avoid sticking through infected skin per [facility]. ID following. TEE 9/16. CT- no evidence of abscess 2. ESRD - Home HD, Sunday, MWF. 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 K+4.3 3. Hypertension/volume - 124/57 under edw. Chest xray clear 4. Anemia - hgb 10.2, restart ESA, watch CBC. Cont Fe, give 9/14- tsat 10 5. Metabolic bone disease - Ca+ 8.9/10.3 corrected. Hold zemplar.  6. Nutrition - alb 2.2. Gluten free diet. 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 Myasthenia gravis- per primary 9. Arthritis- per primary- methotrexate outpt 10.  SVT during HD- echo EF 55%. Cards started metop   Shelle Iron, NP [place] [phone] 02/11/2014,10:56 AM  LOS: 3 days    Additional Objective Labs: Basic Metabolic Panel:  Recent Labs Lab 02/08/14 0645 02/09/14 1453 02/10/14 0758  NA 139 135* 138  K 4.2 5.6* 4.3  CL 100 97 102  CO2 25 24 21   GLUCOSE 101* 211* 124*  BUN 45* 66* 67*  CREATININE 5.52* 7.50* 7.06*  CALCIUM 9.4 9.3 8.9   PHOS  --   --  5.9*   Liver Function Tests:  Recent Labs Lab 02/08/14 0645 02/10/14 0758  AST 18  --   ALT 16  --   ALKPHOS 56  --   BILITOT 0.6  --   PROT 6.0  --   ALBUMIN 2.9* 2.2*   No results found for this basename: LIPASE, AMYLASE,  in the last 168 hours CBC:  Recent Labs Lab 02/08/14 0645 02/09/14 1453 02/10/14 0757  WBC 11.0* 11.5* 7.2  NEUTROABS 10.0*  --   --   HGB 12.5* 11.1* 10.2*  HCT 39.6 35.7* 31.8*  MCV 101.8* 98.6 97.0  PLT 102* 98* 100*   Blood Culture    Component Value Date/Time   SDES BLOOD RIGHT ARM 02/08/2014 0645   SPECREQUEST BOTTLES DRAWN AEROBIC AND ANAEROBIC 6CC 02/08/2014 0645   CULT  Value: STAPHYLOCOCCUS AUREUS Note: RIFAMPIN AND GENTAMICIN SHOULD NOT BE USED AS SINGLE DRUGS FOR TREATMENT OF STAPH INFECTIONS. Note: Gram Stain Report Called to,Read Back By and Verified With: Sima Matas RN on 02/09/14 at 02:00 by Rise Mu Performed at [facility] 02/08/2014 0645   REPTSTATUS 02/11/2014 FINAL 02/08/2014 0645    Cardiac Enzymes: No results found for this basename: CKTOTAL, CKMB, CKMBINDEX, TROPONINI,  in the last 168 hours CBG: No results found for this basename: GLUCAP,  in the last  168 hours [place] Studies: No results found for this basename: IRON, TIBC, TRANSFERRIN, FERRITIN,  in the last 72 hours @lablastinr3 @ Studies/Results: Ct Humerus Left W Contrast  02/10/2014   CLINICAL DATA:  Hemodialysis patient with draining wound at left upper arm fistula. Evaluate for soft tissue abscess.  EXAM: CT OF THE LEFT HUMERUS WITH CONTRAST  TECHNIQUE: Multidetector CT imaging was performed following the standard protocol during bolus administration of intravenous contrast.  CONTRAST:  35mL OMNIPAQUE IOHEXOL 300 MG/ML  SOLN  COMPARISON:  None.  FINDINGS: Metallic BBs were placed around the area of concern anteriorly in the mid upper arm. There is mild subcutaneous edema in this area, but no focal fluid collection. The hemodialysis graft is  well opacified with contrast. Its proximal anastomosis appears normal. The distal anastomosis is not well visualized due to artifact, but also appears grossly normal.  There is no evidence of acute fracture, dislocation or bone destruction. There is no elbow joint effusion. Mild glenohumeral degenerative changes are noted. The visualized left chest wall and left lung appear unremarkable.  IMPRESSION: 1. No evidence of soft tissue abscess in the left upper arm. 2. There is mild nonspecific subcutaneous edema in the area of concern consistent with cellulitis. 3. The hemodialysis graft appears unremarkable.   Electronically Signed   By: Camie Patience M.D.   On: 02/10/2014 16:16   Medications:   . aspirin  81 mg Oral Daily  . calcium acetate  1,334 mg Oral 5 X Daily  .  ceFAZolin (ANCEF) IV  2 g Intravenous Q M,W,F-HD  . folic acid  1 mg Oral Daily  . gabapentin  300 mg Oral QHS  . metoprolol succinate  25 mg Oral BID  . multivitamin  1 tablet Oral QHS  . pantoprazole  40 mg Oral Daily  . paricalcitol  2 mcg Intravenous Q M,W,F  . predniSONE  15 mg Oral Q breakfast  . sodium chloride  3 mL Intravenous Q12H     I have seen and examined this patient and agree with plan as outlined by B. Orvil Feil, NP.  Issues regarding access are the elevated arterial pressures that interfered with HD prior to admission as well as his ability to self cannulate at home now that he cannot use the buttonholes.  Will need to discuss with Dr. Bridgett Larsson and possibly require [facility] for a few weeks until cellulitis clears and pt is able to cannulate on his own. Alizia Greif A,MD 02/11/2014 11:17 AM

## 2014-02-11 NOTE — Progress Notes (Signed)
INFECTIOUS DISEASE PROGRESS NOTE  ID: Adam Walsh is a 73 y.o. male with  Principal Problem:   Staphylococcus aureus bacteremia Active Problems:   End stage renal disease on dialysis   HIT (heparin-induced thrombocytopenia)   Hypertension   Celiac sprue   Cellulitis of arm, left   Rheumatoid arthritis   Sepsis   Myasthenia exacerbation   Macrocytic anemia  Subjective: without complaints, no arm pain.  Abtx:  Anti-infectives   Start     Dose/Rate Route Frequency Ordered Stop   02/10/14 1400  ceFAZolin (ANCEF) IVPB 2 g/50 mL premix     2 g 100 mL/hr over 30 Minutes Intravenous Every M-W-F (Hemodialysis) 02/10/14 1353     02/10/14 1200  vancomycin (VANCOCIN) IVPB 1000 mg/200 mL premix  Status:  Discontinued     1,000 mg 200 mL/hr over 60 Minutes Intravenous Every M-W-F (Hemodialysis) 02/09/14 1229 02/11/14 0951   02/09/14 1600  vancomycin (VANCOCIN) IVPB 750 mg/150 ml premix     750 mg 150 mL/hr over 60 Minutes Intravenous  Once 02/09/14 1459 02/09/14 1813   02/09/14 1500  ceFAZolin (ANCEF) IVPB 2 g/50 mL premix     2 g 100 mL/hr over 30 Minutes Intravenous  Once 02/09/14 1434 02/09/14 1626   02/08/14 0645  vancomycin (VANCOCIN) IVPB 1000 mg/200 mL premix     1,000 mg 200 mL/hr over 60 Minutes Intravenous  Once 02/08/14 0644 02/08/14 0851   02/08/14 0645  piperacillin-tazobactam (ZOSYN) IVPB 2.25 g     2.25 g 100 mL/hr over 30 Minutes Intravenous  Once 02/08/14 0644 02/08/14 0932      Medications:  Scheduled: . aspirin  81 mg Oral Daily  . calcium acetate  1,334 mg Oral 5 X Daily  .  ceFAZolin (ANCEF) IV  2 g Intravenous Q M,W,F-HD  . [START ON 02/12/2014] darbepoetin (ARANESP) injection - DIALYSIS  60 mcg Intravenous Q Wed-HD  . folic acid  1 mg Oral Daily  . gabapentin  300 mg Oral QHS  . metoprolol succinate  25 mg Oral BID  . multivitamin  1 tablet Oral QHS  . pantoprazole  40 mg Oral Daily  . predniSONE  15 mg Oral Q breakfast  . sodium chloride  3 mL  Intravenous Q12H    Objective: Vital signs in last 24 hours: Temp:  [97.6 F (36.4 C)-98.7 F (37.1 C)] 97.6 F (36.4 C) (09/15 1000) Pulse Rate:  [57-69] 58 (09/15 1000) Resp:  [16-18] 16 (09/15 1000) BP: (105-124)/(57-64) 124/57 mmHg (09/15 1000) SpO2:  [95 %-99 %] 99 % (09/15 1000) Weight:  [82.399 kg (181 lb 10.5 oz)] 82.399 kg (181 lb 10.5 oz) (09/14 2021)   General appearance: alert, cooperative and no distress Resp: clear to auscultation bilaterally Cardio: regular rate and rhythm GI: normal findings: bowel sounds normal and soft, non-tender Extremities: pustule over AVF on LUE. tender, erythematous.   Lab Results  Recent Labs  02/09/14 1453 02/10/14 0757 02/10/14 0758  WBC 11.5* 7.2  --   HGB 11.1* 10.2*  --   HCT 35.7* 31.8*  --   NA 135*  --  138  K 5.6*  --  4.3  CL 97  --  102  CO2 24  --  21  BUN 66*  --  67*  CREATININE 7.50*  --  7.06*   Liver Panel  Recent Labs  02/10/14 0758  ALBUMIN 2.2*   Sedimentation Rate No results found for this basename: ESRSEDRATE,  in the last 72  hours C-Reactive Protein No results found for this basename: CRP,  in the last 72 hours  Microbiology: Recent Results (from the past 240 hour(s))  CULTURE, BLOOD (ROUTINE X 2)     Status: None   Collection Time    02/08/14  6:29 AM      Result Value Ref Range Status   Specimen Description BLOOD RIGHT ARM   Final   Special Requests BOTTLES DRAWN AEROBIC AND ANAEROBIC 5CC   Final   Culture  Setup Time     Final   Value: 02/08/2014 15:16     Performed at [facility]   Culture     Final   Value: STAPHYLOCOCCUS AUREUS     Note: SUSCEPTIBILITIES PERFORMED ON PREVIOUS CULTURE WITHIN THE LAST 5 DAYS.     Note: Gram Stain Report Called to,Read Back By and Verified With: Sima Matas RN on 02/09/14 at 02:00 by Rise Mu     Performed at [facility]   Report Status 02/11/2014 FINAL   Final  CULTURE, BLOOD (ROUTINE X 2)     Status: None   Collection Time      02/08/14  6:45 AM      Result Value Ref Range Status   Specimen Description BLOOD RIGHT ARM   Final   Special Requests BOTTLES DRAWN AEROBIC AND ANAEROBIC [facility]   Final   Culture  Setup Time     Final   Value: 02/08/2014 15:18     Performed at [facility]   Culture     Final   Value: STAPHYLOCOCCUS AUREUS     Note: RIFAMPIN AND GENTAMICIN SHOULD NOT BE USED AS SINGLE DRUGS FOR TREATMENT OF STAPH INFECTIONS.     Note: Gram Stain Report Called to,Read Back By and Verified With: Sima Matas RN on 02/09/14 at 02:00 by Rise Mu     Performed at [facility]   Report Status 02/11/2014 FINAL   Final   Organism ID, Bacteria STAPHYLOCOCCUS AUREUS   Final  CULTURE, BLOOD (ROUTINE X 2)     Status: None   Collection Time    02/09/14  6:30 PM      Result Value Ref Range Status   Specimen Description BLOOD RIGHT ARM   Final   Special Requests BOTTLES DRAWN AEROBIC AND ANAEROBIC 5CC EA   Final   Culture  Setup Time     Final   Value: 02/10/2014 00:49     Performed at [facility]   Culture     Final   Value:        BLOOD CULTURE RECEIVED NO GROWTH TO DATE CULTURE WILL BE HELD FOR 5 DAYS BEFORE ISSUING A FINAL NEGATIVE REPORT     Performed at [facility]   Report Status PENDING   Incomplete  CULTURE, BLOOD (ROUTINE X 2)     Status: None   Collection Time    02/09/14  6:45 PM      Result Value Ref Range Status   Specimen Description BLOOD RIGHT HAND   Final   Special Requests BOTTLES DRAWN AEROBIC AND ANAEROBIC 5CC EA   Final   Culture  Setup Time     Final   Value: 02/10/2014 00:49     Performed at [facility]   Culture     Final   Value:        BLOOD CULTURE RECEIVED NO GROWTH TO DATE CULTURE WILL BE HELD FOR 5 DAYS BEFORE ISSUING A  FINAL NEGATIVE REPORT     Note: Culture results may be compromised due to an excessive volume of blood received in culture bottles.     Performed at [facility]   Report Status PENDING   Incomplete     Studies/Results: Ct Humerus Left W Contrast  02/10/2014   CLINICAL DATA:  Hemodialysis patient with draining wound at left upper arm fistula. Evaluate for soft tissue abscess.  EXAM: CT OF THE LEFT HUMERUS WITH CONTRAST  TECHNIQUE: Multidetector CT imaging was performed following the standard protocol during bolus administration of intravenous contrast.  CONTRAST:  96mL OMNIPAQUE IOHEXOL 300 MG/ML  SOLN  COMPARISON:  None.  FINDINGS: Metallic BBs were placed around the area of concern anteriorly in the mid upper arm. There is mild subcutaneous edema in this area, but no focal fluid collection. The hemodialysis graft is well opacified with contrast. Its proximal anastomosis appears normal. The distal anastomosis is not well visualized due to artifact, but also appears grossly normal.  There is no evidence of acute fracture, dislocation or bone destruction. There is no elbow joint effusion. Mild glenohumeral degenerative changes are noted. The visualized left chest wall and left lung appear unremarkable.  IMPRESSION: 1. No evidence of soft tissue abscess in the left upper arm. 2. There is mild nonspecific subcutaneous edema in the area of concern consistent with cellulitis. 3. The hemodialysis graft appears unremarkable.   Electronically Signed   By: Camie Patience M.D.   On: 02/10/2014 16:16     Assessment/Plan: Staph/MSSA bacteremia  ESRD  Fistula infection? CT (-) RA on prednisone, methotrexate on hold  Total days of antibiotics: 4 ancef  Await TEE (9-16) to determine length of therapy. For AVF debridement tomorrow For temporary HD line tomorrow Repeat BCx 9-13, 9-14 pending.          [name] Infectious Diseases (pager) [phone] [email or web address] 02/11/2014, 3:59 PM  LOS: 3 days

## 2014-02-11 NOTE — Progress Notes (Signed)
    [facility] has been requested to perform a transesophageal echocardiogram on 02/12/2014 for bacteremia.  After careful review of history and examination, the risks and benefits of transesophageal echocardiogram have been explained including risks of esophageal damage, perforation (1:10,000 risk), bleeding, pharyngeal hematoma as well as other potential complications associated with conscious sedation including aspiration, arrhythmia, respiratory failure and death. Alternatives to treatment were discussed, questions were answered. Patient is willing to proceed. Patient's wife was present  Patient's Hgb is 10.2, platelet is 100, should be ok with proceed with TEE.    Almyra Deforest, PA-C 02/11/2014 3:04 PM

## 2014-02-11 NOTE — Progress Notes (Signed)
Patient Demographics  Adam Walsh, is a 73 y.o. male, DOB - 03/11/1941, BSW:[id number]  Admit date - 02/08/2014   Admitting Physician Debbe Odea, MD  Outpatient Primary MD for the patient is Donnie Coffin, MD  LOS - 3   Chief Complaint  Patient presents with  . Vascular Access Problem  . Generalized Body Aches        Subjective:   Adam Walsh today has, No headache, No chest pain, No abdominal pain - No Nausea, No new weakness tingling or numbness, No Cough - SOB. Feels much better.    Assessment & Plan    1. Sepsis with cellulitis and possible abscess of left arm AV fistula site. Blood cultures noted for MSSA, IV Ancef only now per ID, vascular surgery and renal consulted. His redness is somewhat improved,  CT scan of the arm does not show abscess but cellulitis with soft tissue swelling, discussed with Dr. Johnnye Sima ID on 02/11/2014 he wants a TTE which was ordered. Likely to be done 02/12/2014 a.m.    2. ESRD. Dialyzes 4 times a week at home, renal consulted.     3. Myasthenia gravis and rheumatoid arthritis. Currently says he does not feel that his weakness is worse, on prednisone 15 mg which will be continued, methotrexate on hold till sepsis is improved. Outpatient rheumatology followup once better. Will  have PT OT follow and monitor.     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      5. History of gluten sensitivity. On gluten free diet.     6. SVT during dialysis 9 4015. On acute EKG, echo shows EF of 55% with grade 1 chronic diastolic CHF, stable TSH, cardiology consulted, currently placed on beta blocker. Monitor.     Code Status: DO NOT RESUSCITATE  Family Communication: None present  Disposition Plan: Home   Procedures left fistula site ultrasound  showing some fluid collection. Chest x-ray unremarkable.  L arm CT , TTE, TEE requested on 02/11/2014   Consults  Renal, Vas Surgery, Cards, ID   Medications  Scheduled Meds: . aspirin  81 mg Oral Daily  . calcium acetate  1,334 mg Oral 5 X Daily  .  ceFAZolin (ANCEF) IV  2 g Intravenous Q M,W,F-HD  . folic acid  1 mg Oral Daily  . gabapentin  300 mg Oral QHS  . metoprolol succinate  25 mg Oral BID  . multivitamin  1 tablet Oral QHS  . pantoprazole  40 mg Oral Daily  . paricalcitol  2 mcg Intravenous Q M,W,F  . predniSONE  15 mg Oral Q breakfast  . sodium chloride  3 mL Intravenous Q12H   Continuous Infusions:  PRN Meds:.sodium chloride, sodium chloride, sodium chloride, acetaminophen, alum & mag hydroxide-simeth, feeding supplement (NEPRO CARB STEADY), HYDROcodone-acetaminophen, [START ON 02/12/2014] Influenza vac split quadrivalent PF, lidocaine (PF), lidocaine-prilocaine, metoprolol, ondansetron (ZOFRAN) IV, pentafluoroprop-tetrafluoroeth, sodium chloride  DVT Prophylaxis  SCD ( H/O possible HIT)  Lab Results  Component Value Date   PLT 100* 02/10/2014    Antibiotics     Anti-infectives   Start     Dose/Rate Route Frequency Ordered Stop   02/10/14 1400  ceFAZolin (ANCEF) IVPB 2 g/50 mL premix     2 g 100  mL/hr over 30 Minutes Intravenous Every M-W-F (Hemodialysis) 02/10/14 1353     02/10/14 1200  vancomycin (VANCOCIN) IVPB 1000 mg/200 mL premix  Status:  Discontinued     1,000 mg 200 mL/hr over 60 Minutes Intravenous Every M-W-F (Hemodialysis) 02/09/14 1229 02/11/14 0951   02/09/14 1600  vancomycin (VANCOCIN) IVPB 750 mg/150 ml premix     750 mg 150 mL/hr over 60 Minutes Intravenous  Once 02/09/14 1459 02/09/14 1813   02/09/14 1500  ceFAZolin (ANCEF) IVPB 2 g/50 mL premix     2 g 100 mL/hr over 30 Minutes Intravenous  Once 02/09/14 1434 02/09/14 1626   02/08/14 0645  vancomycin (VANCOCIN) IVPB 1000 mg/200 mL premix     1,000 mg 200 mL/hr over 60 Minutes Intravenous   Once 02/08/14 0644 02/08/14 0851   02/08/14 0645  piperacillin-tazobactam (ZOSYN) IVPB 2.25 g     2.25 g 100 mL/hr over 30 Minutes Intravenous  Once 02/08/14 0644 02/08/14 0932          Objective:   Filed Vitals:   02/10/14 1300 02/10/14 2021 02/11/14 0440 02/11/14 0800  BP: 111/66 105/64 111/60 112/58  Pulse:  69 57 57  Temp:  98.7 F (37.1 C) 98.2 F (36.8 C) 98.5 F (36.9 C)  TempSrc:  Oral Oral Oral  Resp:  18 18 18   Height:      Weight:  82.399 kg (181 lb 10.5 oz)    SpO2:  95% 95% 95%    Wt Readings from Last 3 Encounters:  02/10/14 82.399 kg (181 lb 10.5 oz)  12/30/13 86.183 kg (190 lb)  11/15/13 85.276 kg (188 lb)     Intake/Output Summary (Last 24 hours) at 02/11/14 0952 Last data filed at 02/11/14 0446  Gross per 24 hour  Intake    120 ml  Output   1465 ml  Net  -1345 ml     Physical Exam  Awake Alert, Oriented X 3, No new F.N deficits, Normal affect [place].AT,PERRAL Supple Neck,No JVD, No cervical lymphadenopathy appriciated.  Symmetrical Chest wall movement, Good air movement bilaterally, CTAB RRR,No Gallops,Rubs or new Murmurs, No Parasternal Heave +ve B.Sounds, Abd Soft, No tenderness, No organomegaly appriciated, No rebound - guarding or rigidity. No Cyanosis, Clubbing or edema, No new Rash or bruise ,  L arm fistula site minimal erythema and warmth and moderate swelling     Data Review   Micro Results Recent Results (from the past 240 hour(s))  CULTURE, BLOOD (ROUTINE X 2)     Status: None   Collection Time    02/08/14  6:29 AM      Result Value Ref Range Status   Specimen Description BLOOD RIGHT ARM   Final   Special Requests BOTTLES DRAWN AEROBIC AND ANAEROBIC 5CC   Final   Culture  Setup Time     Final   Value: 02/08/2014 15:16     Performed at [facility]   Culture     Final   Value: STAPHYLOCOCCUS AUREUS     Note: SUSCEPTIBILITIES PERFORMED ON PREVIOUS CULTURE WITHIN THE LAST 5 DAYS.     Note: Gram Stain Report Called  to,Read Back By and Verified With: Sima Matas RN on 02/09/14 at 02:00 by Rise Mu     Performed at [facility]   Report Status 02/11/2014 FINAL   Final  CULTURE, BLOOD (ROUTINE X 2)     Status: None   Collection Time    02/08/14  6:45 AM  Result Value Ref Range Status   Specimen Description BLOOD RIGHT ARM   Final   Special Requests BOTTLES DRAWN AEROBIC AND ANAEROBIC [facility]   Final   Culture  Setup Time     Final   Value: 02/08/2014 15:18     Performed at [facility]   Culture     Final   Value: STAPHYLOCOCCUS AUREUS     Note: RIFAMPIN AND GENTAMICIN SHOULD NOT BE USED AS SINGLE DRUGS FOR TREATMENT OF STAPH INFECTIONS.     Note: Gram Stain Report Called to,Read Back By and Verified With: Sima Matas RN on 02/09/14 at 02:00 by Rise Mu     Performed at [facility]   Report Status 02/11/2014 FINAL   Final   Organism ID, [place]   Final    Radiology Reports Dg Chest Port 1 View  02/08/2014   CLINICAL DATA:  Vascular access problem.  Generalized body aches.  EXAM: PORTABLE CHEST - 1 VIEW  COMPARISON:  None 10/2012  FINDINGS: The heart size and mediastinal contours are within normal limits. Both lungs are clear. Surgical clips near the gastroesophageal junction. The visualized skeletal structures are unremarkable.  IMPRESSION: No active disease.   Electronically Signed   By: Curlene Dolphin M.D.   On: 02/08/2014 07:52     CBC  Recent Labs Lab 02/08/14 0645 02/09/14 1453 02/10/14 0757  WBC 11.0* 11.5* 7.2  HGB 12.5* 11.1* 10.2*  HCT 39.6 35.7* 31.8*  PLT 102* 98* 100*  MCV 101.8* 98.6 97.0  MCH 32.1 30.7 31.1  MCHC 31.6 31.1 32.1  RDW 15.2 15.1 15.0  LYMPHSABS 0.8  --   --   MONOABS 0.1  --   --   EOSABS 0.1  --   --   BASOSABS 0.0  --   --     Chemistries   Recent Labs Lab 02/08/14 0645 02/09/14 1453 02/10/14 0758  NA 139 135* 138  K 4.2 5.6* 4.3  CL 100 97 102  CO2 25 24 21   GLUCOSE 101* 211* 124*  BUN  45* 66* 67*  CREATININE 5.52* 7.50* 7.06*  CALCIUM 9.4 9.3 8.9  AST 18  --   --   ALT 16  --   --   ALKPHOS 56  --   --   BILITOT 0.6  --   --    ------------------------------------------------------------------------------------------------------------------ estimated creatinine clearance is 9.9 ml/min (by C-G formula based on Cr of 7.06). ------------------------------------------------------------------------------------------------------------------ No results found for this basename: HGBA1C,  in the last 72 hours ------------------------------------------------------------------------------------------------------------------ No results found for this basename: CHOL, HDL, LDLCALC, TRIG, CHOLHDL, LDLDIRECT,  in the last 72 hours ------------------------------------------------------------------------------------------------------------------  Recent Labs  02/10/14 1230  TSH 2.510   ------------------------------------------------------------------------------------------------------------------ No results found for this basename: VITAMINB12, FOLATE, FERRITIN, TIBC, IRON, RETICCTPCT,  in the last 72 hours  Coagulation profile No results found for this basename: INR, PROTIME,  in the last 168 hours  No results found for this basename: DDIMER,  in the last 72 hours  Cardiac Enzymes No results found for this basename: CK, CKMB, TROPONINI, MYOGLOBIN,  in the last 168 hours ------------------------------------------------------------------------------------------------------------------ No components found with this basename: POCBNP,      Time Spent in minutes   35   SINGH,PRASHANT K M.D on 02/11/2014 at 9:52 AM  Between 7am to 7pm - Pager - [phone]  After 7pm go to [email or web address] - password [email or web address]  And look for the night coverage person covering for me after hours  [facility] Office  [phone]   **Disclaimer: This note may have been dictated with  voice recognition software. Similar sounding words can inadvertently be transcribed and this note may contain transcription errors which may not have been corrected upon publication of note.**

## 2014-02-11 NOTE — Progress Notes (Signed)
ANTIBIOTIC CONSULT NOTE - FOLLOW UP  Pharmacy Consult for Cefazolin Indication: MSSA bacteremia d/t infected AVF  Allergies  Allergen Reactions  . Avelox [Moxifloxacin Hcl In Nacl]     Pt quit breathing  . Gluten   . Gluten Meal Anaphylaxis  . Ciprofloxacin Other (See Comments)    myasthenia  . Erythromycin Other (See Comments)    myasthenia  . Erythromycin Base Other (See Comments)    Myasthenia gravis  . Gemifloxacin Other (See Comments)    myasthenia  . Gentamicin Other (See Comments)    myasthenia  . Heparin Other (See Comments)    myasthenia  . Kanamycin Other (See Comments)    myasthenia  . Levofloxacin Other (See Comments)    myasthenia  . 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 Neomycin Other (See Comments)    myasthenia  . Norfloxacin Other (See Comments)    myasthenia  . Quinolones Other (See Comments)    Myasthenia gravis  . Streptomycin Other (See Comments)    myasthenia  . Tobramycin Other (See Comments)    myasthenia    Patient Measurements: Height: 5' 10.08" (178 cm) Weight: 181 lb 10.5 oz (82.399 kg) IBW/kg (Calculated) : 73.18  Vital Signs: Temp: 98.5 F (36.9 C) (09/15 0800) Temp src: Oral (09/15 0800) BP: 112/58 mmHg (09/15 0800) Pulse Rate: 57 (09/15 0800) Intake/Output from previous day: 09/14 0701 - 09/15 0700 In: 120 [P.O.:120] Out: 1465  Intake/Output from this shift:    Labs:  Recent Labs  02/09/14 1453 02/10/14 0757 02/10/14 0758  WBC 11.5* 7.2  --   HGB 11.1* 10.2*  --   PLT 98* 100*  --   CREATININE 7.50*  --  7.06*   Estimated Creatinine Clearance: 9.9 ml/min (by C-G formula based on Cr of 7.06). No results found for this basename: VANCOTROUGH, Corlis Leak, VANCORANDOM, [place], GENTPEAK, GENTRANDOM, TOBRATROUGH, TOBRAPEAK, TOBRARND, AMIKACINPEAK, AMIKACINTROU, AMIKACIN,  in the last 72 hours   Microbiology: Recent Results (from the past 720  hour(s))  CULTURE, BLOOD (ROUTINE X 2)     Status: None   Collection Time    02/08/14  6:29 AM      Result Value Ref Range Status   Specimen Description BLOOD RIGHT ARM   Final   Special Requests BOTTLES DRAWN AEROBIC AND ANAEROBIC 5CC   Final   Culture  Setup Time     Final   Value: 02/08/2014 15:16     Performed at [facility]   Culture     Final   Value: STAPHYLOCOCCUS AUREUS     Note: SUSCEPTIBILITIES PERFORMED ON PREVIOUS CULTURE WITHIN THE LAST 5 DAYS.     Note: Gram Stain Report Called to,Read Back By and Verified With: Sima Matas RN on 02/09/14 at 02:00 by Rise Mu     Performed at [facility]   Report Status 02/11/2014 FINAL   Final  CULTURE, BLOOD (ROUTINE X 2)     Status: None   Collection Time    02/08/14  6:45 AM      Result Value Ref Range Status   Specimen Description BLOOD RIGHT ARM   Final   Special Requests BOTTLES DRAWN AEROBIC AND ANAEROBIC [facility]   Final   Culture  Setup Time     Final   Value: 02/08/2014 15:18     Performed at [facility]   Culture     Final   Value: STAPHYLOCOCCUS AUREUS  Note: RIFAMPIN AND GENTAMICIN SHOULD NOT BE USED AS SINGLE DRUGS FOR TREATMENT OF STAPH INFECTIONS.     Note: Gram Stain Report Called to,Read Back By and Verified With: Sima Matas RN on 02/09/14 at 02:00 by Rise Mu     Performed at [facility]   Report Status 02/11/2014 FINAL   Final   Organism ID, Bacteria STAPHYLOCOCCUS AUREUS   Final    Anti-infectives   Start     Dose/Rate Route Frequency Ordered Stop   02/10/14 1400  ceFAZolin (ANCEF) IVPB 2 g/50 mL premix     2 g 100 mL/hr over 30 Minutes Intravenous Every M-W-F (Hemodialysis) 02/10/14 1353     02/10/14 1200  vancomycin (VANCOCIN) IVPB 1000 mg/200 mL premix  Status:  Discontinued     1,000 mg 200 mL/hr over 60 Minutes Intravenous Every M-W-F (Hemodialysis) 02/09/14 1229 02/11/14 0951   02/09/14 1600  vancomycin (VANCOCIN) IVPB 750 mg/150 ml premix     750  mg 150 mL/hr over 60 Minutes Intravenous  Once 02/09/14 1459 02/09/14 1813   02/09/14 1500  ceFAZolin (ANCEF) IVPB 2 g/50 mL premix     2 g 100 mL/hr over 30 Minutes Intravenous  Once 02/09/14 1434 02/09/14 1626   02/08/14 0645  vancomycin (VANCOCIN) IVPB 1000 mg/200 mL premix     1,000 mg 200 mL/hr over 60 Minutes Intravenous  Once 02/08/14 0644 02/08/14 0851   02/08/14 0645  piperacillin-tazobactam (ZOSYN) IVPB 2.25 g     2.25 g 100 mL/hr over 30 Minutes Intravenous  Once 02/08/14 0644 02/08/14 0932      Assessment: 67 YOM with ESRD who continues on Ancef for MSSA bacteremia in the setting of an infected AVF. ID on board. TTE negative for vegetations, TEE ordered - will f/u results. Imaging of AVF did not show abscess however did show cellulitis - will f/u plans to remove if deemed necessary.   The patient's normal HD schedule appears to be Sun/Mon/Wed/Fri however skipped on Sunday, 9/13. Will continue with scheduled Ancef on MWF and will f/u plans for HD this coming Sunday.  Goal of Therapy:  Proper antibiotics for infection/cultures adjusted for renal/hepatic function   Plan:  1. Cont Cefazolin 2g/HD-MWF 2. Will continue to follow HD schedule/duration, culture results, LOT, and antibiotic de-escalation plans   Alycia Rossetti, PharmD, BCPS Clinical Pharmacist Pager: [phone] 02/11/2014 10:00 AM

## 2014-02-12 ENCOUNTER — Inpatient Hospital Stay (HOSPITAL_COMMUNITY): Payer: Medicare Other | Admitting: Certified Registered"

## 2014-02-12 ENCOUNTER — Encounter (HOSPITAL_COMMUNITY)
Admission: EM | Disposition: A | Discharge status: Home Health | Payer: Self-pay | Source: Home / Self Care | Attending: Internal Medicine

## 2014-02-12 ENCOUNTER — Encounter (HOSPITAL_COMMUNITY): Payer: Self-pay | Admitting: *Deleted

## 2014-02-12 ENCOUNTER — Encounter (HOSPITAL_COMMUNITY): Payer: Medicare Other | Admitting: Certified Registered"

## 2014-02-12 ENCOUNTER — Inpatient Hospital Stay (HOSPITAL_COMMUNITY): Payer: Medicare Other

## 2014-02-12 DIAGNOSIS — A4101 Sepsis due to Methicillin susceptible Staphylococcus aureus: Principal | ICD-10-CM

## 2014-02-12 DIAGNOSIS — K900 Celiac disease: Secondary | ICD-10-CM

## 2014-02-12 DIAGNOSIS — IMO0002 Reserved for concepts with insufficient information to code with codable children: Secondary | ICD-10-CM

## 2014-02-12 DIAGNOSIS — A419 Sepsis, unspecified organism: Secondary | ICD-10-CM

## 2014-02-12 DIAGNOSIS — R7881 Bacteremia: Secondary | ICD-10-CM

## 2014-02-12 HISTORY — PX: INSERTION OF DIALYSIS CATHETER: SHX1324

## 2014-02-12 HISTORY — PX: INCISION AND DRAINAGE ABSCESS: SHX5864

## 2014-02-12 HISTORY — PX: TEE WITHOUT CARDIOVERSION: SHX5443

## 2014-02-12 HISTORY — PX: FISTULOGRAM: SHX5832

## 2014-02-12 LAB — RENAL FUNCTION PANEL
ANION GAP: 18 — AB (ref 5–15)
Albumin: 2.6 g/dL — ABNORMAL LOW (ref 3.5–5.2)
BUN: 57 mg/dL — AB (ref 6–23)
CALCIUM: 9.8 mg/dL (ref 8.4–10.5)
CO2: 21 mEq/L (ref 19–32)
Chloride: 100 mEq/L (ref 96–112)
Creatinine, Ser: 6.62 mg/dL — ABNORMAL HIGH (ref 0.50–1.35)
GFR calc Af Amer: 9 mL/min — ABNORMAL LOW (ref >90)
GFR calc non Af Amer: 7 mL/min — ABNORMAL LOW (ref >90)
GLUCOSE: 159 mg/dL — AB (ref 70–99)
Phosphorus: 5.5 mg/dL — ABNORMAL HIGH (ref 2.3–4.6)
Potassium: 4.1 mEq/L (ref 3.7–5.3)
Sodium: 139 mEq/L (ref 137–147)

## 2014-02-12 LAB — CBC
HEMATOCRIT: 32.5 % — AB (ref 39.0–52.0)
HEMOGLOBIN: 10.1 g/dL — AB (ref 13.0–17.0)
MCH: 31.4 pg (ref 26.0–34.0)
MCHC: 31.1 g/dL (ref 30.0–36.0)
MCV: 100.9 fL — ABNORMAL HIGH (ref 78.0–100.0)
Platelets: 69 10*3/uL — ABNORMAL LOW (ref 150–400)
RBC: 3.22 MIL/uL — ABNORMAL LOW (ref 4.22–5.81)
RDW: 14.8 % (ref 11.5–15.5)
WBC: 2.5 10*3/uL — ABNORMAL LOW (ref 4.0–10.5)

## 2014-02-12 LAB — BASIC METABOLIC PANEL
Anion gap: 13 (ref 5–15)
BUN: 50 mg/dL — AB (ref 6–23)
CHLORIDE: 102 meq/L (ref 96–112)
CO2: 22 mEq/L (ref 19–32)
Calcium: 9.4 mg/dL (ref 8.4–10.5)
Creatinine, Ser: 5.82 mg/dL — ABNORMAL HIGH (ref 0.50–1.35)
GFR calc Af Amer: 10 mL/min — ABNORMAL LOW (ref >90)
GFR calc non Af Amer: 9 mL/min — ABNORMAL LOW (ref >90)
Glucose, Bld: 154 mg/dL — ABNORMAL HIGH (ref 70–99)
Potassium: 5.7 mEq/L — ABNORMAL HIGH (ref 3.7–5.3)
SODIUM: 137 meq/L (ref 137–147)

## 2014-02-12 SURGERY — FISTULOGRAM
Anesthesia: Monitor Anesthesia Care | Site: Neck | Laterality: Right

## 2014-02-12 SURGERY — ECHOCARDIOGRAM, TRANSESOPHAGEAL
Anesthesia: Moderate Sedation

## 2014-02-12 MED ORDER — THROMBIN 20000 UNITS EX SOLR
CUTANEOUS | Status: AC
  Filled 2014-02-12: qty 20000

## 2014-02-12 MED ORDER — MIDAZOLAM HCL 5 MG/ML IJ SOLN
INTRAMUSCULAR | Status: AC
  Filled 2014-02-12: qty 2

## 2014-02-12 MED ORDER — LIDOCAINE-EPINEPHRINE (PF) 1 %-1:200000 IJ SOLN
INTRAMUSCULAR | Status: AC
  Filled 2014-02-12: qty 10

## 2014-02-12 MED ORDER — FENTANYL CITRATE 0.05 MG/ML IJ SOLN
INTRAMUSCULAR | Status: AC
  Filled 2014-02-12: qty 5

## 2014-02-12 MED ORDER — ALTEPLASE 2 MG IJ SOLR: 2.0000 mg | Freq: Once | INTRAMUSCULAR | Status: DC | PRN

## 2014-02-12 MED ORDER — LIDOCAINE VISCOUS 2 % MT SOLN
OROMUCOSAL | Status: DC | PRN
  Administered 2014-02-12: 12 mL via OROMUCOSAL

## 2014-02-12 MED ORDER — FENTANYL CITRATE 0.05 MG/ML IJ SOLN
INTRAMUSCULAR | Status: AC
  Filled 2014-02-12: qty 2

## 2014-02-12 MED ORDER — PROPOFOL 10 MG/ML IV BOLUS
INTRAVENOUS | Status: AC
  Filled 2014-02-12: qty 20

## 2014-02-12 MED ORDER — LIDOCAINE HCL (PF) 1 % IJ SOLN: 5.0000 mL | INTRAMUSCULAR | Status: DC | PRN

## 2014-02-12 MED ORDER — LIDOCAINE-EPINEPHRINE (PF) 1 %-1:200000 IJ SOLN
INTRAMUSCULAR | Status: DC | PRN
  Administered 2014-02-12: 25 mL via INTRADERMAL

## 2014-02-12 MED ORDER — MIDAZOLAM HCL 10 MG/2ML IJ SOLN
INTRAMUSCULAR | Status: DC | PRN
  Administered 2014-02-12: 1 mg via INTRAVENOUS
  Administered 2014-02-12: 2 mg via INTRAVENOUS

## 2014-02-12 MED ORDER — NEPRO/CARBSTEADY PO LIQD: 237.0000 mL | ORAL | Status: DC | PRN

## 2014-02-12 MED ORDER — BUTAMBEN-TETRACAINE-BENZOCAINE 2-2-14 % EX AERO
INHALATION_SPRAY | CUTANEOUS | Status: DC | PRN
  Administered 2014-02-12: 2 via TOPICAL

## 2014-02-12 MED ORDER — PROPOFOL INFUSION 10 MG/ML OPTIME
INTRAVENOUS | Status: DC | PRN
  Administered 2014-02-12: 100 ug/kg/min via INTRAVENOUS

## 2014-02-12 MED ORDER — ANTICOAGULANT SODIUM CITRATE 4% (200MG/5ML) IV SOLN
Status: DC | PRN
  Administered 2014-02-12: 4.6 mL

## 2014-02-12 MED ORDER — IODIXANOL 320 MG/ML IV SOLN
INTRAVENOUS | Status: DC | PRN
  Administered 2014-02-12: 20 mL via INTRAVENOUS

## 2014-02-12 MED ORDER — LIDOCAINE HCL (CARDIAC) 20 MG/ML IV SOLN
INTRAVENOUS | Status: DC | PRN
  Administered 2014-02-12: 30 mg via INTRAVENOUS

## 2014-02-12 MED ORDER — ANTICOAGULANT SODIUM CITRATE 4% (200MG/5ML) IV SOLN
10.0000 mL | Status: DC
  Filled 2014-02-12: qty 250

## 2014-02-12 MED ORDER — ZOLPIDEM TARTRATE 5 MG PO TABS
5.0000 mg | ORAL_TABLET | Freq: Once | ORAL | Status: AC
  Administered 2014-02-12: 5 mg via ORAL
  Filled 2014-02-12 (×2): qty 1

## 2014-02-12 MED ORDER — DARBEPOETIN ALFA-POLYSORBATE 60 MCG/0.3ML IJ SOLN
INTRAMUSCULAR | Status: AC
  Filled 2014-02-12: qty 0.3

## 2014-02-12 MED ORDER — SODIUM CHLORIDE 0.9 % IR SOLN
Status: DC | PRN
  Administered 2014-02-12: 3000 mL

## 2014-02-12 MED ORDER — LIDOCAINE-PRILOCAINE 2.5-2.5 % EX CREA: 1.0000 "application " | TOPICAL_CREAM | CUTANEOUS | Status: DC | PRN

## 2014-02-12 MED ORDER — LIDOCAINE HCL (CARDIAC) 20 MG/ML IV SOLN
INTRAVENOUS | Status: AC
  Filled 2014-02-12: qty 5

## 2014-02-12 MED ORDER — SODIUM CHLORIDE 0.9 % IV SOLN
INTRAVENOUS | Status: DC | PRN
  Administered 2014-02-12: 12:00:00 via INTRAVENOUS

## 2014-02-12 MED ORDER — SUCCINYLCHOLINE CHLORIDE 20 MG/ML IJ SOLN
INTRAMUSCULAR | Status: AC
  Filled 2014-02-12: qty 1

## 2014-02-12 MED ORDER — LIDOCAINE VISCOUS 2 % MT SOLN
OROMUCOSAL | Status: AC
  Filled 2014-02-12: qty 15

## 2014-02-12 MED ORDER — HYDROCODONE-ACETAMINOPHEN 5-325 MG PO TABS
1.0000 | ORAL_TABLET | Freq: Four times a day (QID) | ORAL | Status: DC | PRN
  Administered 2014-02-12 – 2014-02-14 (×2): 1 via ORAL
  Administered 2014-02-14 – 2014-02-15 (×2): 2 via ORAL
  Filled 2014-02-12: qty 1
  Filled 2014-02-12 (×2): qty 2
  Filled 2014-02-12: qty 1

## 2014-02-12 MED ORDER — MIDAZOLAM HCL 2 MG/2ML IJ SOLN
INTRAMUSCULAR | Status: AC
  Filled 2014-02-12: qty 2

## 2014-02-12 MED ORDER — PENTAFLUOROPROP-TETRAFLUOROETH EX AERO: 1.0000 "application " | INHALATION_SPRAY | CUTANEOUS | Status: DC | PRN

## 2014-02-12 MED ORDER — HEPARIN SODIUM (PORCINE) 1000 UNIT/ML DIALYSIS: 1000.0000 [IU] | INTRAMUSCULAR | Status: DC | PRN

## 2014-02-12 MED ORDER — FENTANYL CITRATE 0.05 MG/ML IJ SOLN
INTRAMUSCULAR | Status: DC | PRN
  Administered 2014-02-12 (×2): 25 ug via INTRAVENOUS

## 2014-02-12 MED ORDER — SODIUM CHLORIDE 0.9 % IJ SOLN
INTRAMUSCULAR | Status: AC
  Filled 2014-02-12: qty 10

## 2014-02-12 MED ORDER — 0.9 % SODIUM CHLORIDE (POUR BTL) OPTIME
TOPICAL | Status: DC | PRN
  Administered 2014-02-12: 1000 mL

## 2014-02-12 MED ORDER — EPHEDRINE SULFATE 50 MG/ML IJ SOLN
INTRAMUSCULAR | Status: AC
  Filled 2014-02-12: qty 1

## 2014-02-12 MED ORDER — SODIUM CHLORIDE 0.9 % IV SOLN: 100.0000 mL | INTRAVENOUS | Status: DC | PRN

## 2014-02-12 MED ORDER — THROMBIN 20000 UNITS EX SOLR
OROMUCOSAL | Status: DC | PRN
  Administered 2014-02-12: 13:00:00 via TOPICAL

## 2014-02-12 SURGICAL SUPPLY — 73 items
BAG DECANTER FOR FLEXI CONT (MISCELLANEOUS) IMPLANT
BNDG GAUZE ELAST 4 BULKY (GAUZE/BANDAGES/DRESSINGS) ×4 IMPLANT
CANISTER SUCTION 2500CC (MISCELLANEOUS) ×4 IMPLANT
CATH CANNON HEMO 15F 50CM (CATHETERS) IMPLANT
CATH CANNON HEMO 15FR 19 (HEMODIALYSIS SUPPLIES) IMPLANT
CATH CANNON HEMO 15FR 23CM (HEMODIALYSIS SUPPLIES) ×4 IMPLANT
CATH CANNON HEMO 15FR 31CM (HEMODIALYSIS SUPPLIES) IMPLANT
CATH CANNON HEMO 15FR 32CM (HEMODIALYSIS SUPPLIES) IMPLANT
CATH STRAIGHT 5FR 65CM (CATHETERS) IMPLANT
COVER PROBE W GEL 5X96 (DRAPES) ×8 IMPLANT
COVER SURGICAL LIGHT HANDLE (MISCELLANEOUS) ×4 IMPLANT
DERMABOND ADVANCED (GAUZE/BANDAGES/DRESSINGS) ×2
DERMABOND ADVANCED .7 DNX12 (GAUZE/BANDAGES/DRESSINGS) ×2 IMPLANT
DRAPE CHEST BREAST 15X10 FENES (DRAPES) ×4 IMPLANT
ELECT REM PT RETURN 9FT ADLT (ELECTROSURGICAL) ×4
ELECTRODE REM PT RTRN 9FT ADLT (ELECTROSURGICAL) ×2 IMPLANT
GAUZE SPONGE 2X2 8PLY STRL LF (GAUZE/BANDAGES/DRESSINGS) ×2 IMPLANT
GAUZE SPONGE 4X4 16PLY XRAY LF (GAUZE/BANDAGES/DRESSINGS) ×4 IMPLANT
GAUZE XEROFORM 1X8 LF (GAUZE/BANDAGES/DRESSINGS) ×4 IMPLANT
GEL ULTRASOUND 20GR AQUASONIC (MISCELLANEOUS) ×4 IMPLANT
GLOVE BIO SURGEON STRL SZ7 (GLOVE) ×4 IMPLANT
GLOVE BIOGEL PI IND STRL 7.0 (GLOVE) ×4 IMPLANT
GLOVE BIOGEL PI IND STRL 7.5 (GLOVE) ×4 IMPLANT
GLOVE BIOGEL PI INDICATOR 7.0 (GLOVE) ×4
GLOVE BIOGEL PI INDICATOR 7.5 (GLOVE) ×4
GLOVE ECLIPSE 6.5 STRL STRAW (GLOVE) ×12 IMPLANT
GOWN STRL REUS W/ TWL LRG LVL3 (GOWN DISPOSABLE) ×6 IMPLANT
GOWN STRL REUS W/TWL LRG LVL3 (GOWN DISPOSABLE) ×6
HANDPIECE INTERPULSE COAX TIP (DISPOSABLE) ×2
INTRODUCER COOK 11FR (CATHETERS) IMPLANT
INTRODUCER SET COOK 14FR (MISCELLANEOUS) IMPLANT
KIT BASIN OR (CUSTOM PROCEDURE TRAY) ×4 IMPLANT
KIT ENCORE 26 ADVANTAGE (KITS) IMPLANT
KIT ROOM TURNOVER OR (KITS) ×4 IMPLANT
NEEDLE 18GX1X1/2 (RX/OR ONLY) (NEEDLE) ×4 IMPLANT
NEEDLE HYPO 25GX1X1/2 BEV (NEEDLE) ×4 IMPLANT
NEEDLE PERC 18GX7CM (NEEDLE) ×4 IMPLANT
NS IRRIG 1000ML POUR BTL (IV SOLUTION) ×4 IMPLANT
PACK CV ACCESS (CUSTOM PROCEDURE TRAY) ×4 IMPLANT
PACK SURGICAL SETUP 50X90 (CUSTOM PROCEDURE TRAY) ×4 IMPLANT
PAD ARMBOARD 7.5X6 YLW CONV (MISCELLANEOUS) ×8 IMPLANT
PROTECTION STATION PRESSURIZED (MISCELLANEOUS) ×4
SET HNDPC FAN SPRY TIP SCT (DISPOSABLE) ×2 IMPLANT
SET INTRODUCER 12FR PACEMAKER (SHEATH) IMPLANT
SET MICROPUNCTURE 5F STIFF (MISCELLANEOUS) ×12 IMPLANT
SOAP 2 % CHG 4 OZ (WOUND CARE) IMPLANT
SPONGE GAUZE 2X2 STER 10/PKG (GAUZE/BANDAGES/DRESSINGS) ×2
SPONGE GAUZE 4X4 12PLY STER LF (GAUZE/BANDAGES/DRESSINGS) ×4 IMPLANT
SPONGE SURGIFOAM ABS GEL 100 (HEMOSTASIS) ×4 IMPLANT
STATION PROTECTION PRESSURIZED (MISCELLANEOUS) ×2 IMPLANT
STOPCOCK MORSE 400PSI 3WAY (MISCELLANEOUS) ×4 IMPLANT
SUT ETHILON 3 0 PS 1 (SUTURE) ×4 IMPLANT
SUT MNCRL AB 4-0 PS2 18 (SUTURE) ×4 IMPLANT
SUT PROLENE 6 0 BV (SUTURE) ×4 IMPLANT
SUT PROLENE 7 0 BV 1 (SUTURE) ×8 IMPLANT
SUT SILK 2 0 FS (SUTURE) IMPLANT
SUT VIC AB 3-0 SH 27 (SUTURE) ×2
SUT VIC AB 3-0 SH 27X BRD (SUTURE) ×2 IMPLANT
SWAB COLLECTION DEVICE MRSA (MISCELLANEOUS) ×4 IMPLANT
SYR 20CC LL (SYRINGE) ×8 IMPLANT
SYR 3ML LL SCALE MARK (SYRINGE) ×4 IMPLANT
SYR 5ML LL (SYRINGE) ×4 IMPLANT
SYR CONTROL 10ML LL (SYRINGE) ×4 IMPLANT
SYRINGE 10CC LL (SYRINGE) ×4 IMPLANT
TAPE CLOTH SURG 4X10 WHT LF (GAUZE/BANDAGES/DRESSINGS) ×4 IMPLANT
TOWEL OR 17X24 6PK STRL BLUE (TOWEL DISPOSABLE) ×4 IMPLANT
TOWEL OR 17X26 10 PK STRL BLUE (TOWEL DISPOSABLE) ×4 IMPLANT
TUBE ANAEROBIC SPECIMEN COL (MISCELLANEOUS) ×4 IMPLANT
TUBING CIL FLEX 10 FLL-RA (TUBING) ×4 IMPLANT
UNDERPAD 30X30 INCONTINENT (UNDERPADS AND DIAPERS) ×4 IMPLANT
WATER STERILE IRR 1000ML POUR (IV SOLUTION) IMPLANT
WIRE AMPLATZ SS-J .035X180CM (WIRE) IMPLANT
WIRE BENTSON .035X145CM (WIRE) ×4 IMPLANT

## 2014-02-12 NOTE — Transfer of Care (Signed)
Immediate Anesthesia Transfer of Care Note  Patient: Adam Walsh  Procedure(s) Performed: Procedure(s): FISTULOGRAM (Left) INSERTION OF DIALYSIS CATHETER (Right) INCISION AND DRAINAGE ABSCESS (Left)  Patient Location: PACU  Anesthesia Type:MAC  Level of Consciousness: awake, alert  and oriented  Airway & Oxygen Therapy: Patient Spontanous Breathing  Post-op Assessment: Report given to PACU RN  Post vital signs: Reviewed and stable  Complications: No apparent anesthesia complications

## 2014-02-12 NOTE — Progress Notes (Signed)
Patient returned from dialysis very agitated. Pt threw telemetry box on the floor and states ''I am refusing this''. NP on call notified, CCMD notified as well. Will continue to monitor patient.

## 2014-02-12 NOTE — H&P (Signed)
     INTERVAL PROCEDURE H&P  History and Physical Interval Note:  02/12/2014 9:47 AM  Adam Walsh has presented today for their planned procedure. The various methods of treatment have been discussed with the patient and family. After consideration of risks, benefits and other options for treatment, the patient has consented to the procedure.  The patients' outpatient history has been reviewed, patient examined, and no change in status from most recent office note within the past 30 days. I have reviewed the patients' chart and labs and will proceed as planned. Questions were answered to the patient's satisfaction.   Pixie Casino, MD, [facility] Attending Cardiologist [facility]  Torina Ey C 02/12/2014, 9:47 AM

## 2014-02-12 NOTE — Progress Notes (Signed)
  Echocardiogram Echocardiogram Transesophageal has been performed.  Harnoor Reta 02/12/2014, 1:08 PM

## 2014-02-12 NOTE — CV Procedure (Signed)
    TRANSESOPHAGEAL ECHOCARDIOGRAM (TEE) NOTE  INDICATIONS: infective endocarditis  PROCEDURE:   Informed consent was obtained prior to the procedure. The risks, benefits and alternatives for the procedure were discussed and the patient comprehended these risks.  Risks include, but are not limited to, cough, sore throat, vomiting, nausea, somnolence, esophageal and stomach trauma or perforation, bleeding, low blood pressure, aspiration, pneumonia, infection, trauma to the teeth and death.    After a procedural time-out, the patient was given 3 mg versed and 50 mcg fentanyl for moderate sedation.  The oropharynx was anesthetized 10 cc of topical 1% viscous lidocaine and 1 cetacaine spray.  The transesophageal probe was inserted in the esophagus and stomach without difficulty and multiple views were obtained.  The patient was kept under observation until the patient left the procedure room.  The patient left the procedure room in stable condition.   Agitated microbubble saline contrast was administered.  COMPLICATIONS:    There were no immediate complications.  Findings:  1. LEFT VENTRICLE: The left ventricular wall thickness is mildly increased.  The left ventricular cavity is normal in size. Wall motion is normal.  LVEF is 55-60%.  2. RIGHT VENTRICLE:  The right ventricle is normal in structure and function without any thrombus or masses.    3. LEFT ATRIUM:  The left atrium is normal in size without any thrombus or masses.  There is not spontaneous echo contrast ("smoke") in the left atrium consistent with a low flow state.  4. LEFT ATRIAL APPENDAGE:  The left atrial appendage is free of any thrombus or masses. The appendage has single lobes. Pulse doppler indicates moderate flow in the appendage.  5. ATRIAL SEPTUM:  The atrial septum is mobile, but not aneurysmal.  There is a small PFO noted by saline microbubble contrast with right to left shunting after provocation.  6. RIGHT  ATRIUM:  The right atrium is normal in size and function without any thrombus or masses.  7. MITRAL VALVE:  The mitral valve is normal in structure and function with trivial regurgitation.  There were no vegetations or stenosis.  8. AORTIC VALVE:  The aortic valve is trileaflet, normal in structure and function with no regurgitation.  There were no vegetations or stenosis  9. TRICUSPID VALVE:  The tricuspid valve is normal in structure and function with trivial regurgitation.  There were no vegetations or stenosis  10.  PULMONIC VALVE:  The pulmonic valve is normal in structure and function with trivial regurgitation.  There were no vegetations or stenosis.   11. AORTIC ARCH, ASCENDING AND DESCENDING AORTA:  There was grade 1 Ron Parker et. Al, 1992) atherosclerosis of the ascending aorta, aortic arch, or proximal descending aorta.  12. PULMONARY VEINS: Anomalous pulmonary venous return was not noted.  13. PERICARDIUM: The pericardium appeared normal and non-thickened.  There is no pericardial effusion.  IMPRESSION:   1. No evidence for endocarditis 2. No LAA thrombus 3. Small PFO with right to left shunting after provocation 4. LVEF 55-60%  RECOMMENDATIONS:    1.  Antibiotics per ID for cellulitis/abscess.  Time Spent Directly with the Patient:  30 minutes   Pixie Casino, MD, [facility] Attending Cardiologist [facility]  02/12/2014, 10:11 AM

## 2014-02-12 NOTE — Op Note (Signed)
OPERATIVE NOTE  PROCEDURE: 1. Right internal jugular vein tunneled dialysis catheter placement 2. Right internal jugular vein cannulation under ultrasound guidance 3. Left arm fistulogram 4. Incision and drainage of left arm abscess   PRE-OPERATIVE DIAGNOSIS: end-stage renal failure  POST-OPERATIVE DIAGNOSIS: same as above  SURGEON: Adele Barthel, MD  ANESTHESIA: local and MAC  ESTIMATED BLOOD LOSS: 30 cc  FINDING(S): 1.  Tips of the catheter in the right atrium on fluoroscopy 2.  No obvious pneumothorax on fluoroscopy 3.  ~20 cc of pus drained from abscess cavity adjacent to fistula 4.  Fistula intact without any evidence of wall compromise 5.  Patent fistula throughout with widely patent central veins  SPECIMEN(S):  Anaerobic and aerobic cultures of abscess cavity INDICATIONS:   Adam Walsh is a 73 y.o. male who presents with cellulitis in left arm.  Due to persistence of infection in left arm, hemodialysis was reluctant to continue cannulating the left basilic vein transposition.  Additionally, since initial presentation, a frank abscess has developed in the left arm.  The patient presents for tunneled dialysis catheter placement, left arm fistulogram, and abscess drainage.  The patient is aware the risks of tunneled dialysis catheter placement include but are not limited to: bleeding, infection, central venous injury, pneumothorax, possible venous stenosis, possible malpositioning in the venous system, and possible infections related to long-term catheter presence.  The patient is aware that depending on the abscess drainage findings, the fistula may need to be tied off.  The patient was aware of these risks and agreed to proceed.  DESCRIPTION: After written full informed consent was obtained from the patient, the patient was taken back to the operating room.  Prior to induction, the patient was given IV antibiotics.  After obtaining adequate sedation, the patient was prepped  and draped in the standard fashion for a chest or neck tunneled dialysis catheter placement.  I anesthesized the neck cannulation site with local anesthetic.  Under ultrasound guidance, the right internal jugular vein was cannulated with the 18 gauge needle.  A J-wire was then placed down in the inferior vena cava under fluroscopic guidance.  The wire was then secured in place with a clamp to the drapes.  The cannulation site, the catheter exit site, and tract for the subcutaneous tunnel were then anesthesized with a total of 20 cc of a 1:1 mixture of 0.5% Marcaine without epinepherine and 1% Lidocaine with epinepherine.  I then made stab incisions at the neck and exit sites.   I dissected from the exit site to the cannulation site with a metal tunneler.   The subcutaneous tunnel was dilated by passing a plastic dilator over the metal dissector. The wire was then unclamped and I removed the needle.  The skin tract and venotomy was dilated serially with dilators.  Finally, the dilator-sheath was placed under fluroscopic guidance into the superior vena cava.  The dilator and wire were removed.  A 23 cm Diatek catheter was placed under fluoroscopic guidance down into the right atrium.  The sheath was broken and peeled away while holding the catheter cuff at the level of the skin.  The back end of this catheter was transected, revealing the two lumens of this catheter.  The ports were docked onto these two lumens.  The catheter hub was then screwed into place.  Each port was tested by aspirating and flushing.  No resistance was noted.  Each port was then thoroughly flushed with heparinized saline.  The catheter was  secured in placed with two interrupted stitches of 3-0 Nylon tied to the catheter.  The neck incision was closed with a U-stitch of 4-0 Monocryl.  The neck and chest incision were cleaned and sterile bandages applied.  Each port was then loaded with concentrated heparin (1000 Units/mL) at the manufacturer  recommended volumes to each port.  Sterile caps were applied to each port.  On completion fluoroscopy, the tips of the catheter were in the right atrium, and there was no evidence of pneumothorax.  I then turned my attention to his left arm.  I injected the local anesthetic in a field fashion around the obvious abscess.  I made an incision through the abscess and immediately thick purulent debris drained from the abscess.  With continued pressure to the left arm, more pus drain than expected.  I dissected bluntly into the abscess cavity and extended the incision.  I was able to completely explore the abscess cavity.  I washed out the abscess cavity with a Pulsavac irrigator with 3 L of sterile saline.  No active bleeding occurred.  At ths base of the cavity, appeared to be one wall of the basilic vein transposition.  There was no evidence of wall compromise.  I verify this was the fistula rather than the brachial artery, I cannulated the fistula with a micropuncture needle.  The microwire was passed into the fistula.  The needle was exchanged for a microsheath.  I imaged the fistula from the cannulation site until the central veins with hand injections.  I then held pressure on the fistula proximally and did a reflux injection to image the arterial anastomosis.  I placed a 7-0 Prolene stitch around the sheath and tied down the suture while removing the sheath.  I packed the wound with thombin and gelfoam.  After waiting a few minutes, I removed the thrombin.  There was no further active bleeding.  I felt an attempt of salvage of this fistula should be made.  I reapproximated the tissue overlying the fistula with interrupted 3-0 Vicryl sutures.  The skin was left open.  I packed a sterile 2 x 2 in the incision.  I placed Xeroform gauze over the an area of skin slough.  A pressure bandage was applied and held in place with a Kerlix.    COMPLICATIONS: none  CONDITION: stable  Adele Barthel, MD [facility] Office: [phone] Pager: [phone]  02/12/2014, 1:53 PM

## 2014-02-12 NOTE — Progress Notes (Signed)
Subjective:   Back hurts, no other complaints  Objective Filed Vitals:   02/12/14 1130 02/12/14 1400 02/12/14 1415 02/12/14 1430  BP: 118/45 125/61 125/61 125/62  Pulse: 51 48 47 47  Temp:  97.4 F (36.3 C)    TempSrc:      Resp: 18 15 15 16   Height:      Weight:      SpO2: 97% 98% 98% 98%   Physical Exam General: alert and oriented Heart: RRR. Nu murmur Lungs: CTA, unlabored Abdomen: soft, nontender +BS  Extremities: no edema Dialysis Access:  R IJ (placed 9/16) L AVF fistulogram and I&D -  Dr. Bridgett Larsson 9/16  Dialysis Orders:  Home HD using NxStage with PureFlow 4X Week, MonWedFriSat, CAR 172, Therapy Fluid 1.0 K 45 Lactate, Volume per Tx 25 (liters), Flow Fraction 45 %, BFR 400, EDW 84  Epogen- 11,000u q week  Venofer 300mg  once 9/15  Paricalcititol 2 mcg 3x week  Assessment/Plan:  1. Cellulitis- . Blood cultures +Staph. Ancef per pharmacy.ID following. TEE 9/16- no evidence of endocardities. I&D of L arm abscess- cultures pending 9/16 Dr. Bridgett Larsson 2. ESRD - Home HD, Sunday, MWF. 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  3. Hypertension/volume - 125/62 under edw. Chest xray clear 4. Anemia - hgb 10.2, restart ESA, watch CBC post op. Cont Fe, give 9/14- tsat 10 5. Metabolic bone disease - Ca+ 9.4/10.8 corrected. Hold zemplar.  6. Nutrition - alb 2.2. Gluten free diet. 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 Myasthenia gravis- per primary 9. Arthritis- per primary- methotrexate outpt 10. SVT during HD- echo EF 55%. Cards started metop 11. dispo- will receive HD at home training via cath while being treated for infection   Shelle Iron, NP [place] [phone] 02/12/2014,3:05 PM  LOS: 4 days    Additional Objective Labs: Basic Metabolic Panel:  Recent Labs Lab 02/09/14 1453 02/10/14 0758 02/11/14 2335  NA 135* 138 137  K 5.6* 4.3 5.7*  CL 97 102 102  CO2 24 21 22   GLUCOSE 211* 124* 154*  BUN 66* 67* 50*  CREATININE 7.50* 7.06* 5.82*  CALCIUM 9.3 8.9 9.4   PHOS  --  5.9*  --    Liver Function Tests:  Recent Labs Lab 02/08/14 0645 02/10/14 0758  AST 18  --   ALT 16  --   ALKPHOS 56  --   BILITOT 0.6  --   PROT 6.0  --   ALBUMIN 2.9* 2.2*   No results found for this basename: LIPASE, AMYLASE,  in the last 168 hours CBC:  Recent Labs Lab 02/08/14 0645 02/09/14 1453 02/10/14 0757  WBC 11.0* 11.5* 7.2  NEUTROABS 10.0*  --   --   HGB 12.5* 11.1* 10.2*  HCT 39.6 35.7* 31.8*  MCV 101.8* 98.6 97.0  PLT 102* 98* 100*   Blood Culture    Component Value Date/Time   SDES BLOOD RIGHT HAND 02/09/2014 1845   SPECREQUEST BOTTLES DRAWN AEROBIC AND ANAEROBIC 5CC EA 02/09/2014 1845   CULT  Value:        BLOOD CULTURE RECEIVED NO GROWTH TO DATE CULTURE WILL BE HELD FOR 5 DAYS BEFORE ISSUING A FINAL NEGATIVE REPORT Note: Culture results may be compromised due to an excessive volume of blood received in culture bottles. Performed at [facility] 02/09/2014 1845   REPTSTATUS PENDING 02/09/2014 1845    Cardiac Enzymes: No results found for this basename: CKTOTAL, CKMB, CKMBINDEX, TROPONINI,  in the last 168 hours CBG: No results found  for this basename: GLUCAP,  in the last 168 hours [place] Studies: No results found for this basename: IRON, TIBC, TRANSFERRIN, FERRITIN,  in the last 72 hours @lablastinr3 @ Studies/Results: Ct Humerus Left W Contrast  02/10/2014   CLINICAL DATA:  Hemodialysis patient with draining wound at left upper arm fistula. Evaluate for soft tissue abscess.  EXAM: CT OF THE LEFT HUMERUS WITH CONTRAST  TECHNIQUE: Multidetector CT imaging was performed following the standard protocol during bolus administration of intravenous contrast.  CONTRAST:  85mL OMNIPAQUE IOHEXOL 300 MG/ML  SOLN  COMPARISON:  None.  FINDINGS: Metallic BBs were placed around the area of concern anteriorly in the mid upper arm. There is mild subcutaneous edema in this area, but no focal fluid collection. The hemodialysis graft is well opacified with  contrast. Its proximal anastomosis appears normal. The distal anastomosis is not well visualized due to artifact, but also appears grossly normal.  There is no evidence of acute fracture, dislocation or bone destruction. There is no elbow joint effusion. Mild glenohumeral degenerative changes are noted. The visualized left chest wall and left lung appear unremarkable.  IMPRESSION: 1. No evidence of soft tissue abscess in the left upper arm. 2. There is mild nonspecific subcutaneous edema in the area of concern consistent with cellulitis. 3. The hemodialysis graft appears unremarkable.   Electronically Signed   By: Camie Patience M.D.   On: 02/10/2014 16:16   Medications:   . aspirin  81 mg Oral Daily  . calcium acetate  1,334 mg Oral 5 X Daily  .  ceFAZolin (ANCEF) IV  2 g Intravenous Q M,W,F-HD  . darbepoetin (ARANESP) injection - DIALYSIS  60 mcg Intravenous Q Wed-HD  . folic acid  1 mg Oral Daily  . gabapentin  300 mg Oral QHS  . metoprolol succinate  25 mg Oral BID  . multivitamin  1 tablet Oral QHS  . pantoprazole  40 mg Oral Daily  . predniSONE  15 mg Oral Q breakfast  . sodium chloride  3 mL Intravenous Q12H     I have seen and examined this patient and agree with plan as outlined by B. Orvil Feil, NP.  Will use TDC for now until cellulitis clears and then will need to start new cannulation areas and would avoid buttonholes given recent infection.  Pt will get incenter HD at hometraining at [facility] as discussed with Hometraining team and Dr. Mercy Moore. Garner Dullea A,MD 02/12/2014 3:46 PM

## 2014-02-12 NOTE — Anesthesia Procedure Notes (Addendum)
Procedure Name: MAC Date/Time: 02/12/2014 12:18 PM Performed by: Barrington Ellison Pre-anesthesia Checklist: Patient identified, Emergency Drugs available, Suction available and Patient being monitored Patient Re-evaluated:Patient Re-evaluated prior to inductionOxygen Delivery Method: Simple face mask

## 2014-02-12 NOTE — Progress Notes (Signed)
Chart reviewed.                                                                                                                                                                                               Patient Demographics  Adam Walsh, is a 73 y.o. male, DOB - January 24, 1941, OEU:[id number]  Admit date - 02/08/2014   Admitting Physician Debbe Odea, MD  Outpatient Primary MD for the patient is Donnie Coffin, MD  LOS - 4   Chief Complaint  Patient presents with  . Vascular Access Problem  . Generalized Body Aches        Subjective:   Pain controlled. No f/c/n/v   Assessment & Plan    1. MSSA sepsis secondary to infected AVF. TEE negative. Dr. Bridgett Larsson debrided area: 20 cc pus. abx and dispo per ID and VVS  2. ESRD. Dialyzes 4 times a week at home, renal consulted.  3. Myasthenia gravis and rheumatoid arthritis. Stable. 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   5. History of gluten sensitivity. On gluten free diet.  6. SVT during dialysis 9 4015. On acute EKG, echo shows EF of 55% with grade 1 chronic diastolic CHF, stable TSH, cardiology consulted, currently placed on beta blocker. Monitor.  Code Status: DO NOT RESUSCITATE  Family Communication: multiple at bedside  Disposition Plan:    Procedures  TEE  1. Right internal jugular vein tunneled dialysis catheter placement  2. Right internal jugular vein cannulation under ultrasound guidance  3. Left arm fistulogram  4. Incision and drainage of left arm abscess   Consults  Renal, Vas Surgery, Cards, ID   Medications  Scheduled Meds: . aspirin  81 mg Oral Daily  . calcium acetate  1,334 mg Oral 5 X Daily  .  ceFAZolin (ANCEF) IV  2 g Intravenous Q M,W,F-HD  . darbepoetin (ARANESP) injection - DIALYSIS  60 mcg Intravenous Q Wed-HD  . folic acid  1 mg Oral Daily  . gabapentin  300 mg Oral QHS  . metoprolol succinate  25 mg Oral BID  . multivitamin  1 tablet  Oral QHS  . pantoprazole  40 mg Oral Daily  . predniSONE  15 mg Oral Q breakfast  . sodium chloride  3 mL Intravenous Q12H   Continuous Infusions: . sodium chloride     PRN Meds:.sodium chloride, sodium chloride, sodium chloride, acetaminophen, alum & mag hydroxide-simeth, feeding supplement (NEPRO CARB STEADY), HYDROcodone-acetaminophen, Influenza vac split quadrivalent PF, lidocaine (PF), lidocaine-prilocaine, metoprolol, ondansetron (ZOFRAN) IV, pentafluoroprop-tetrafluoroeth, sodium chloride  DVT Prophylaxis  SCD ( H/O possible HIT)  Lab Results  Component Value Date   PLT 100* 02/10/2014    Antibiotics     Anti-infectives   Start     Dose/Rate Route Frequency Ordered Stop   02/10/14 1400  ceFAZolin (ANCEF) IVPB 2 g/50 mL premix     2 g 100 mL/hr over 30 Minutes Intravenous Every M-W-F (Hemodialysis) 02/10/14 1353     02/10/14 1200  vancomycin (VANCOCIN) IVPB 1000 mg/200 mL premix  Status:  Discontinued     1,000 mg 200 mL/hr over 60 Minutes Intravenous Every M-W-F (Hemodialysis) 02/09/14 1229 02/11/14 0951   02/09/14 1600  vancomycin (VANCOCIN) IVPB 750 mg/150 ml premix     750 mg 150 mL/hr over 60 Minutes Intravenous  Once 02/09/14 1459 02/09/14 1813   02/09/14 1500  ceFAZolin (ANCEF) IVPB 2 g/50 mL premix     2 g 100 mL/hr over 30 Minutes Intravenous  Once 02/09/14 1434 02/09/14 1626   02/08/14 0645  vancomycin (VANCOCIN) IVPB 1000 mg/200 mL premix     1,000 mg 200 mL/hr over 60 Minutes Intravenous  Once 02/08/14 0644 02/08/14 0851   02/08/14 0645  piperacillin-tazobactam (ZOSYN) IVPB 2.25 g     2.25 g 100 mL/hr over 30 Minutes Intravenous  Once 02/08/14 0644 02/08/14 0932          Objective:   Filed Vitals:   02/11/14 1710 02/11/14 2059 02/12/14 0508 02/12/14 0910  BP: 127/62 124/58 113/58 136/64  Pulse: 62 58 60 86  Temp: 98.6 F (37 C) 98.3 F (36.8 C) 98.3 F (36.8 C) 97.6 F (36.4 C)  TempSrc: Oral Oral Oral Oral  Resp: 18 18 18    Height:        Weight:  82.399 kg (181 lb 10.5 oz)    SpO2: 95% 98% 97% 98%    Wt Readings from Last 3 Encounters:  02/11/14 82.399 kg (181 lb 10.5 oz)  02/11/14 82.399 kg (181 lb 10.5 oz)  02/11/14 82.399 kg (181 lb 10.5 oz)     Intake/Output Summary (Last 24 hours) at 02/12/14 0932 Last data filed at 02/12/14 0512  Gross per 24 hour  Intake    480 ml  Output      0 ml  Net    480 ml     Physical Exam  Awake Alert, slightly groggy. Oriented and appropriate Symmetrical CTAB without WRR RRR,No Gallops,Rubs or new Murmurs, +ve B.Sounds, Abd Soft, No tenderness, No organomegaly appriciated, No rebound - guarding or rigidity. No Cyanosis, Clubbing or edema, No new Rash or bruise. Left arm with dressing CDI  Data Review   Micro Results Recent Results (from the past 240 hour(s))  CULTURE, BLOOD (ROUTINE X 2)     Status: None   Collection Time    02/08/14  6:29 AM      Result Value Ref Range Status   Specimen Description BLOOD RIGHT ARM   Final   Special Requests BOTTLES DRAWN AEROBIC AND ANAEROBIC 5CC   Final   Culture  Setup Time     Final   Value: 02/08/2014 15:16     Performed at [facility]   Culture     Final   Value: STAPHYLOCOCCUS AUREUS     Note: SUSCEPTIBILITIES PERFORMED ON PREVIOUS CULTURE WITHIN THE LAST 5 DAYS.     Note: Gram Stain Report Called to,Read Back By and Verified With: Sima Matas RN on 02/09/14 at 02:00 by Rise Mu     Performed at [facility]   Report Status  02/11/2014 FINAL   Final  CULTURE, BLOOD (ROUTINE X 2)     Status: None   Collection Time    02/08/14  6:45 AM      Result Value Ref Range Status   Specimen Description BLOOD RIGHT ARM   Final   Special Requests BOTTLES DRAWN AEROBIC AND ANAEROBIC [facility]   Final   Culture  Setup Time     Final   Value: 02/08/2014 15:18     Performed at [facility]   Culture     Final   Value: STAPHYLOCOCCUS AUREUS     Note: RIFAMPIN AND GENTAMICIN SHOULD NOT BE USED AS SINGLE DRUGS FOR  TREATMENT OF STAPH INFECTIONS.     Note: Gram Stain Report Called to,Read Back By and Verified With: Sima Matas RN on 02/09/14 at 02:00 by Rise Mu     Performed at [facility]   Report Status 02/11/2014 FINAL   Final   Organism ID, Bacteria STAPHYLOCOCCUS AUREUS   Final  CULTURE, BLOOD (ROUTINE X 2)     Status: None   Collection Time    02/09/14  6:30 PM      Result Value Ref Range Status   Specimen Description BLOOD RIGHT ARM   Final   Special Requests BOTTLES DRAWN AEROBIC AND ANAEROBIC 5CC EA   Final   Culture  Setup Time     Final   Value: 02/10/2014 00:49     Performed at [facility]   Culture     Final   Value:        BLOOD CULTURE RECEIVED NO GROWTH TO DATE CULTURE WILL BE HELD FOR 5 DAYS BEFORE ISSUING A FINAL NEGATIVE REPORT     Performed at [facility]   Report Status PENDING   Incomplete  CULTURE, BLOOD (ROUTINE X 2)     Status: None   Collection Time    02/09/14  6:45 PM      Result Value Ref Range Status   Specimen Description BLOOD RIGHT HAND   Final   Special Requests BOTTLES DRAWN AEROBIC AND ANAEROBIC 5CC EA   Final   Culture  Setup Time     Final   Value: 02/10/2014 00:49     Performed at [facility]   Culture     Final   Value:        BLOOD CULTURE RECEIVED NO GROWTH TO DATE CULTURE WILL BE HELD FOR 5 DAYS BEFORE ISSUING A FINAL NEGATIVE REPORT     Note: Culture results may be compromised due to an excessive volume of blood received in culture bottles.     Performed at [facility]   Report Status PENDING   Incomplete    Radiology Reports Dg Chest [place] 1 View  02/08/2014   CLINICAL DATA:  Vascular access problem.  Generalized body aches.  EXAM: PORTABLE CHEST - 1 VIEW  COMPARISON:  None 10/2012  FINDINGS: The heart size and mediastinal contours are within normal limits. Both lungs are clear. Surgical clips near the gastroesophageal junction. The visualized skeletal structures are unremarkable.  IMPRESSION: No  active disease.   Electronically Signed   By: Curlene Dolphin M.D.   On: 02/08/2014 07:52     CBC  Recent Labs Lab 02/08/14 0645 02/09/14 1453 02/10/14 0757  WBC 11.0* 11.5* 7.2  HGB 12.5* 11.1* 10.2*  HCT 39.6 35.7* 31.8*  PLT 102* 98* 100*  MCV 101.8* 98.6 97.0  MCH 32.1 30.7 31.1  MCHC 31.6 31.1 32.1  RDW 15.2 15.1 15.0  LYMPHSABS 0.8  --   --   MONOABS 0.1  --   --   EOSABS 0.1  --   --   BASOSABS 0.0  --   --     Chemistries   Recent Labs Lab 02/08/14 0645 02/09/14 1453 02/10/14 0758 02/11/14 2335  NA 139 135* 138 137  K 4.2 5.6* 4.3 5.7*  CL 100 97 102 102  CO2 25 24 21 22   GLUCOSE 101* 211* 124* 154*  BUN 45* 66* 67* 50*  CREATININE 5.52* 7.50* 7.06* 5.82*  CALCIUM 9.4 9.3 8.9 9.4  AST 18  --   --   --   ALT 16  --   --   --   ALKPHOS 56  --   --   --   BILITOT 0.6  --   --   --    ------------------------------------------------------------------------------------------------------------------ estimated creatinine clearance is 12.1 ml/min (by C-G formula based on Cr of 5.82). ------------------------------------------------------------------------------------------------------------------ No results found for this basename: HGBA1C,  in the last 72 hours ------------------------------------------------------------------------------------------------------------------ No results found for this basename: CHOL, HDL, LDLCALC, TRIG, CHOLHDL, LDLDIRECT,  in the last 72 hours ------------------------------------------------------------------------------------------------------------------  Recent Labs  02/10/14 1230  TSH 2.510   ------------------------------------------------------------------------------------------------------------------ No results found for this basename: VITAMINB12, FOLATE, FERRITIN, TIBC, IRON, RETICCTPCT,  in the last 72 hours  Coagulation profile No results found for this basename: INR, PROTIME,  in the last 168 hours  No results  found for this basename: DDIMER,  in the last 72 hours  Cardiac Enzymes No results found for this basename: CK, CKMB, TROPONINI, MYOGLOBIN,  in the last 168 hours ------------------------------------------------------------------------------------------------------------------ No components found with this basename: POCBNP,      Time Spent in minutes   25   Kathlean Cinco L M.D on 02/12/2014 at 9:32 AM Between 7am to 7pm - Pager - [phone]  After 7pm [email or web address] - password [facility]  [facility]

## 2014-02-12 NOTE — Anesthesia Postprocedure Evaluation (Signed)
Anesthesia Post Note  Patient: Adam Walsh  Procedure(s) Performed: Procedure(s) (LRB): FISTULOGRAM (Left) INSERTION OF DIALYSIS CATHETER (Right) INCISION AND DRAINAGE ABSCESS (Left)  Anesthesia type: MAC  Patient location: PACU  Post pain: Pain level controlled  Post assessment: Patient's Cardiovascular Status Stable  Last Vitals:  Filed Vitals:   02/12/14 1430  BP: 125/62  Pulse: 47  Temp:   Resp: 16    Post vital signs: Reviewed and stable  Level of consciousness: sedated  Complications: No apparent anesthesia complications

## 2014-02-12 NOTE — Anesthesia Preprocedure Evaluation (Addendum)
Anesthesia Evaluation  Patient identified by MRN, date of birth, ID band Patient awake    Reviewed: Allergy & Precautions, H&P , NPO status , Patient's Chart, lab work & pertinent test results  History of Anesthesia Complications Negative for: history of anesthetic complications  Airway Mallampati: II TM Distance: <3 FB Neck ROM: Full    Dental  (+) Edentulous Upper, Edentulous Lower   Pulmonary former smoker,          Cardiovascular hypertension, Rhythm:Regular Rate:Bradycardia     Neuro/Psych Myesthenia Gravis negative psych ROS   GI/Hepatic negative GI ROS, Neg liver ROS,   Endo/Other    Renal/GU ESRF and DialysisRenal disease     Musculoskeletal   Abdominal   Peds  Hematology   Anesthesia Other Findings   Reproductive/Obstetrics                          Anesthesia Physical Anesthesia Plan  ASA: III  Anesthesia Plan: MAC   Post-op Pain Management:    Induction:   Airway Management Planned: Simple Face Mask  Additional Equipment:   Intra-op Plan:   Post-operative Plan:   Informed Consent:   Plan Discussed with: CRNA, Anesthesiologist and Surgeon  Anesthesia Plan Comments:         Anesthesia Quick Evaluation

## 2014-02-12 NOTE — Progress Notes (Signed)
PT Cancellation Note  Patient Details Name: ARNEL WYMER MRN: [id number] DOB: [birth year]   Cancelled Treatment:    Reason Eval/Treat Not Completed: Patient at procedure or test/unavailable  Off floor for debridement and placement of temporary HD cath; Will follow up later today as time allows;  Otherwise, will follow up for PT tomorrow;   Thank you,  Roney Marion, [place] Pager [phone] Office [phone]     Roney Marion [facility] 02/12/2014, 9:41 AM

## 2014-02-12 NOTE — Procedures (Signed)
Patient was seen on dialysis and the procedure was supervised. BFR 300 Via RIJ TDC BP is 125/62.  Patient appears to be tolerating treatment well

## 2014-02-12 NOTE — ED Provider Notes (Signed)
Medical screening examination/treatment/procedure(s) were conducted as a shared visit with non-physician practitioner(s) and myself.  I personally evaluated the patient during the encounter.  Redness, pain and swelling at RUE fistula site yesterday.  Receives home HD.  Hx myasthenia gravis.  Denies fever at home but febrile here.  Distal pulse intact. Labs, cultures, antibiotics.  Concern for infected HD fistula. No abscess seen on [place]. D/w Dr. Bridgett Larsson who has evaluated and agrees with medical admission.   EKG Interpretation None       Ezequiel Essex, MD 02/12/14 1121

## 2014-02-13 ENCOUNTER — Encounter (HOSPITAL_COMMUNITY): Payer: Self-pay | Admitting: Internal Medicine

## 2014-02-13 DIAGNOSIS — D7582 Heparin induced thrombocytopenia (HIT): Secondary | ICD-10-CM

## 2014-02-13 DIAGNOSIS — D75829 Heparin-induced thrombocytopenia, unspecified: Secondary | ICD-10-CM

## 2014-02-13 NOTE — Progress Notes (Signed)
Subjective:   Back hurt on HD yesterday but better now.   Objective Filed Vitals:   02/12/14 1930 02/12/14 2001 02/12/14 2022 02/13/14 0513  BP: 104/60 104/57 118/58 111/55  Pulse: 50 64 71 61  Temp:  97.6 F (36.4 C) 98.4 F (36.9 C) 99.1 F (37.3 C)  TempSrc:  Oral    Resp: 19 15 21 18   Height:      Weight:  82.8 kg (182 lb 8.7 oz)    SpO2:  98% 98% 94%   Physical Exam General: alert and oriented no acute distress Heart: RRR. No murmur  Lungs: CTA, unlabored  Abdomen: soft, nontender +BS  Extremities: no edema Dialysis Access:  R IJ - placed 9/16. L AVF I&D 9/16 Dr. Bridgett Larsson.  Edema and tenderness improving.    Dialysis Orders:  Home HD using NxStage with PureFlow 4X Week, MonWedFriSat, CAR 172, Therapy Fluid 1.0 K 45 Lactate, Volume per Tx 25 (liters), Flow Fraction 45 %, BFR 400, EDW 84  Epogen- 11,000u q week  Venofer 300mg  once 9/15  Paricalcititol 2 mcg 3x week   Assessment/Plan:  1. Cellulitis- . Blood cultures +Staph. Ancef per pharmacy. ID following. TEE 9/16- no evidence of endocardities. I&D of L arm abscess- few gream positive cocci- 9/16 Dr. Bridgett Larsson 2. ESRD - Home HD, Sunday, MWF. 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  3. Hypertension/volume - 111/55 under edw. Chest xray clear 4. Anemia - hgb 10.1, restart ESA, watch CBC post op. Cont Fe, give 9/14- tsat 10 5. Metabolic bone disease - Ca+ 9.8/10.9 corrected. Hold zemplar.  6. Nutrition - alb 2.6. Gluten free diet. 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 Myasthenia gravis- per primary 9. Arthritis- per primary- methotrexate outpt 10. SVT during HD- echo EF 55%. Cards started metop 11. dispo- will receive HD at home training via cath while being treated for infection when stable for discharge 12.   Shelle Iron, NP [place] [phone] 02/13/2014,8:43 AM  LOS: 5 days    Additional Objective Labs: Basic Metabolic Panel:  Recent Labs Lab 02/10/14 0758 02/11/14 2335 02/12/14 1600  NA 138 137 139   K 4.3 5.7* 4.1  CL 102 102 100  CO2 21 22 21   GLUCOSE 124* 154* 159*  BUN 67* 50* 57*  CREATININE 7.06* 5.82* 6.62*  CALCIUM 8.9 9.4 9.8  PHOS 5.9*  --  5.5*   Liver Function Tests:  Recent Labs Lab 02/08/14 0645 02/10/14 0758 02/12/14 1600  AST 18  --   --   ALT 16  --   --   ALKPHOS 56  --   --   BILITOT 0.6  --   --   PROT 6.0  --   --   ALBUMIN 2.9* 2.2* 2.6*   No results found for this basename: LIPASE, AMYLASE,  in the last 168 hours CBC:  Recent Labs Lab 02/08/14 0645 02/09/14 1453 02/10/14 0757 02/12/14 1600  WBC 11.0* 11.5* 7.2 2.5*  NEUTROABS 10.0*  --   --   --   HGB 12.5* 11.1* 10.2* 10.1*  HCT 39.6 35.7* 31.8* 32.5*  MCV 101.8* 98.6 97.0 100.9*  PLT 102* 98* 100* 69*   Blood Culture    Component Value Date/Time   SDES ABSCESS ARM LEFT 02/12/2014 1304   SDES ABSCESS ARM LEFT 02/12/2014 1304   SPECREQUEST PT ON ANCEF VANCO 02/12/2014 1304   SPECREQUEST PT ON ANCEF VANCO 02/12/2014 1304   CULT PENDING 02/12/2014 1304   CULT PENDING 02/12/2014 1304  REPTSTATUS PENDING 02/12/2014 1304   REPTSTATUS PENDING 02/12/2014 1304    Cardiac Enzymes: No results found for this basename: CKTOTAL, CKMB, CKMBINDEX, TROPONINI,  in the last 168 hours CBG: No results found for this basename: GLUCAP,  in the last 168 hours [place] Studies: No results found for this basename: IRON, TIBC, TRANSFERRIN, FERRITIN,  in the last 72 hours @lablastinr3 @ Studies/Results: Dg Chest Port 1 View  02/12/2014   CLINICAL DATA:  Central catheter placement  EXAM: PORTABLE CHEST - 1 VIEW  COMPARISON:  February 08, 2014  FINDINGS: Central catheter tip is at the cavoatrial junction. No pneumothorax. There is underlying emphysematous change. There is mild scarring in the left base. There is no edema or consolidation. Heart is upper normal in size with pulmonary vascularity within normal limits. No adenopathy.  IMPRESSION: Central catheter tip at cavoatrial junction. No pneumothorax. No edema or  consolidation. Underlying emphysema. Scarring left base.   Electronically Signed   By: Lowella Grip M.D.   On: 02/12/2014 15:05   Medications:   . aspirin  81 mg Oral Daily  . calcium acetate  1,334 mg Oral 5 X Daily  .  ceFAZolin (ANCEF) IV  2 g Intravenous Q M,W,F-HD  . darbepoetin (ARANESP) injection - DIALYSIS  60 mcg Intravenous Q Wed-HD  . folic acid  1 mg Oral Daily  . gabapentin  300 mg Oral QHS  . metoprolol succinate  25 mg Oral BID  . multivitamin  1 tablet Oral QHS  . pantoprazole  40 mg Oral Daily  . predniSONE  15 mg Oral Q breakfast  . sodium chloride  3 mL Intravenous Q12H     I have seen and examined this patient and agree with plan as outlined by B. Orvil Feil, NP.  Appreciate Dr. Lianne Moris assistance and are awaiting culture and sensitivity results of I&D of abscess before discharge to ensure adequate abx coverage. Crosby Oriordan A,MD 02/13/2014 11:56 AM

## 2014-02-13 NOTE — Progress Notes (Signed)
INFECTIOUS DISEASE PROGRESS NOTE  ID: Adam Walsh is a 73 y.o. male with  Principal Problem:   Staphylococcus aureus bacteremia Active Problems:   End stage renal disease on dialysis   HIT (heparin-induced thrombocytopenia)   Hypertension   Celiac sprue   Cellulitis of arm, left   Rheumatoid arthritis   Sepsis   Myasthenia exacerbation   Macrocytic anemia  Subjective: C/o access issues at HD last night.   Abtx:  Anti-infectives   Start     Dose/Rate Route Frequency Ordered Stop   02/10/14 1400  ceFAZolin (ANCEF) IVPB 2 g/50 mL premix     2 g 100 mL/hr over 30 Minutes Intravenous Every M-W-F (Hemodialysis) 02/10/14 1353     02/10/14 1200  vancomycin (VANCOCIN) IVPB 1000 mg/200 mL premix  Status:  Discontinued     1,000 mg 200 mL/hr over 60 Minutes Intravenous Every M-W-F (Hemodialysis) 02/09/14 1229 02/11/14 0951   02/09/14 1600  vancomycin (VANCOCIN) IVPB 750 mg/150 ml premix     750 mg 150 mL/hr over 60 Minutes Intravenous  Once 02/09/14 1459 02/09/14 1813   02/09/14 1500  ceFAZolin (ANCEF) IVPB 2 g/50 mL premix     2 g 100 mL/hr over 30 Minutes Intravenous  Once 02/09/14 1434 02/09/14 1626   02/08/14 0645  vancomycin (VANCOCIN) IVPB 1000 mg/200 mL premix     1,000 mg 200 mL/hr over 60 Minutes Intravenous  Once 02/08/14 0644 02/08/14 0851   02/08/14 0645  piperacillin-tazobactam (ZOSYN) IVPB 2.25 g     2.25 g 100 mL/hr over 30 Minutes Intravenous  Once 02/08/14 0644 02/08/14 0932      Medications:  Scheduled: . aspirin  81 mg Oral Daily  . calcium acetate  1,334 mg Oral 5 X Daily  .  ceFAZolin (ANCEF) IV  2 g Intravenous Q M,W,F-HD  . darbepoetin (ARANESP) injection - DIALYSIS  60 mcg Intravenous Q Wed-HD  . folic acid  1 mg Oral Daily  . gabapentin  300 mg Oral QHS  . metoprolol succinate  25 mg Oral BID  . multivitamin  1 tablet Oral QHS  . pantoprazole  40 mg Oral Daily  . predniSONE  15 mg Oral Q breakfast  . sodium chloride  3 mL Intravenous Q12H      Objective: Vital signs in last 24 hours: Temp:  [97 F (36.1 C)-99.2 F (37.3 C)] 99.2 F (37.3 C) (09/17 0913) Pulse Rate:  [46-71] 64 (09/17 0913) Resp:  [12-21] 18 (09/17 0913) BP: (89-125)/(47-67) 105/57 mmHg (09/17 0913) SpO2:  [94 %-98 %] 95 % (09/17 0913) Weight:  [82.8 kg (182 lb 8.7 oz)-84.1 kg (185 lb 6.5 oz)] 82.8 kg (182 lb 8.7 oz) (09/16 2001)   General appearance: alert, cooperative and no distress Resp: clear to auscultation bilaterally Cardio: regular rate and rhythm GI: normal findings: bowel sounds normal and soft, non-tender Incision/Wound:  LUE fistula wrapped, + bruit.  Lab Results  Recent Labs  02/11/14 2335 02/12/14 1600  WBC  --  2.5*  HGB  --  10.1*  HCT  --  32.5*  NA 137 139  K 5.7* 4.1  CL 102 100  CO2 22 21  BUN 50* 57*  CREATININE 5.82* 6.62*   Liver Panel  Recent Labs  02/12/14 1600  ALBUMIN 2.6*   Sedimentation Rate No results found for this basename: ESRSEDRATE,  in the last 72 hours C-Reactive Protein No results found for this basename: CRP,  in the last 72 hours  Microbiology: Recent  Results (from the past 240 hour(s))  CULTURE, BLOOD (ROUTINE X 2)     Status: None   Collection Time    02/08/14  6:29 AM      Result Value Ref Range Status   Specimen Description BLOOD RIGHT ARM   Final   Special Requests BOTTLES DRAWN AEROBIC AND ANAEROBIC 5CC   Final   Culture  Setup Time     Final   Value: 02/08/2014 15:16     Performed at [facility]   Culture     Final   Value: STAPHYLOCOCCUS AUREUS     Note: SUSCEPTIBILITIES PERFORMED ON PREVIOUS CULTURE WITHIN THE LAST 5 DAYS.     Note: Gram Stain Report Called to,Read Back By and Verified With: Sima Matas RN on 02/09/14 at 02:00 by Rise Mu     Performed at [facility]   Report Status 02/11/2014 FINAL   Final  CULTURE, BLOOD (ROUTINE X 2)     Status: None   Collection Time    02/08/14  6:45 AM      Result Value Ref Range Status   Specimen  Description BLOOD RIGHT ARM   Final   Special Requests BOTTLES DRAWN AEROBIC AND ANAEROBIC [facility]   Final   Culture  Setup Time     Final   Value: 02/08/2014 15:18     Performed at [facility]   Culture     Final   Value: STAPHYLOCOCCUS AUREUS     Note: RIFAMPIN AND GENTAMICIN SHOULD NOT BE USED AS SINGLE DRUGS FOR TREATMENT OF STAPH INFECTIONS.     Note: Gram Stain Report Called to,Read Back By and Verified With: Sima Matas RN on 02/09/14 at 02:00 by Rise Mu     Performed at [facility]   Report Status 02/11/2014 FINAL   Final   Organism ID, Bacteria STAPHYLOCOCCUS AUREUS   Final  CULTURE, BLOOD (ROUTINE X 2)     Status: None   Collection Time    02/09/14  6:30 PM      Result Value Ref Range Status   Specimen Description BLOOD RIGHT ARM   Final   Special Requests BOTTLES DRAWN AEROBIC AND ANAEROBIC 5CC EA   Final   Culture  Setup Time     Final   Value: 02/10/2014 00:49     Performed at [facility]   Culture     Final   Value:        BLOOD CULTURE RECEIVED NO GROWTH TO DATE CULTURE WILL BE HELD FOR 5 DAYS BEFORE ISSUING A FINAL NEGATIVE REPORT     Performed at [facility]   Report Status PENDING   Incomplete  CULTURE, BLOOD (ROUTINE X 2)     Status: None   Collection Time    02/09/14  6:45 PM      Result Value Ref Range Status   Specimen Description BLOOD RIGHT HAND   Final   Special Requests BOTTLES DRAWN AEROBIC AND ANAEROBIC 5CC EA   Final   Culture  Setup Time     Final   Value: 02/10/2014 00:49     Performed at [facility]   Culture     Final   Value:        BLOOD CULTURE RECEIVED NO GROWTH TO DATE CULTURE WILL BE HELD FOR 5 DAYS BEFORE ISSUING A FINAL NEGATIVE REPORT     Note: Culture results may be compromised due to an excessive volume of blood  received in culture bottles.     Performed at [facility]   Report Status PENDING   Incomplete  CULTURE, ROUTINE-ABSCESS     Status: None   Collection Time    02/12/14   1:04 PM      Result Value Ref Range Status   Specimen Description ABSCESS ARM LEFT   Final   Special Requests PT ON ANCEF VANCO   Final   Gram Stain     Final   Value: ABUNDANT WBC PRESENT,BOTH PMN AND MONONUCLEAR     NO SQUAMOUS EPITHELIAL CELLS SEEN     FEW GRAM POSITIVE COCCI     IN PAIRS     Performed at [facility]   Culture     Final   Value: Culture reincubated for better growth     Performed at [facility]   Report Status PENDING   Incomplete  ANAEROBIC CULTURE     Status: None   Collection Time    02/12/14  1:04 PM      Result Value Ref Range Status   Specimen Description ABSCESS ARM LEFT   Final   Special Requests PT ON ANCEF VANCO   Final   Gram Stain     Final   Value: ABUNDANT WBC PRESENT,BOTH PMN AND MONONUCLEAR     NO SQUAMOUS EPITHELIAL CELLS SEEN     FEW GRAM POSITIVE COCCI     IN PAIRS     Performed at [facility]   Culture     Final   Value: NO ANAEROBES ISOLATED; CULTURE IN PROGRESS FOR 5 DAYS     Performed at [facility]   Report Status PENDING   Incomplete    Studies/Results: Dg Chest Port 1 View  02/12/2014   CLINICAL DATA:  Central catheter placement  EXAM: PORTABLE CHEST - 1 VIEW  COMPARISON:  February 08, 2014  FINDINGS: Central catheter tip is at the cavoatrial junction. No pneumothorax. There is underlying emphysematous change. There is mild scarring in the left base. There is no edema or consolidation. Heart is upper normal in size with pulmonary vascularity within normal limits. No adenopathy.  IMPRESSION: Central catheter tip at cavoatrial junction. No pneumothorax. No edema or consolidation. Underlying emphysema. Scarring left base.   Electronically Signed   By: Lowella Grip M.D.   On: 02/12/2014 15:05     Assessment/Plan: Staph/MSSA bacteremia  ESRD  Fistula infection? CT (-)  RA on prednisone, methotrexate on hold   Total days of antibiotics: 5 ancef  TEE (9-16) negative AVF debridement yesterday    temporary HD line in R chest Repeat BCx 9-13 ngtd.   Plan for 3 more weeks of ancef if d/c home tomorrow         [place] (pager) [phone] [email or web address] 02/13/2014, 1:51 PM  LOS: 5 days

## 2014-02-13 NOTE — Progress Notes (Addendum)
   Daily Progress Note  Assessment/Planning: POD #1 s/p RIJ TDC, L arm I&D, L arm fistulogram   Abscess extended right adjacent to fistula but no evidence of fistula wall involvement  Awaiting C&S of abscess: pt cannot be d/c until C&S back  Wet-to-dry dressing to L arm BID: cannot be d/c until some signs of healing as this patient would die if his fistula dries up and dehisces at home  Subjective  - 1 Day Post-Op  Pain better in L arm  Objective Filed Vitals:   02/12/14 2001 02/12/14 2022 02/13/14 0513 02/13/14 0913  BP: 104/57 118/58 111/55 105/57  Pulse: 64 71 61 64  Temp: 97.6 F (36.4 C) 98.4 F (36.9 C) 99.1 F (37.3 C) 99.2 F (37.3 C)  TempSrc: Oral   Oral  Resp: 15 21 18 18   Height:      Weight: 182 lb 8.7 oz (82.8 kg)     SpO2: 98% 98% 94% 95%    Intake/Output Summary (Last 24 hours) at 02/13/14 1009 Last data filed at 02/13/14 0900  Gross per 24 hour  Intake    610 ml  Output   1283 ml  Net   -673 ml    PULM  CTAB CV  RRR GI  soft, NTND VASC  L arm incision clean without drainage, no bleeding, +bruit + thrill  Laboratory CBC    Component Value Date/Time   WBC 2.5* 02/12/2014 1600   WBC 9.6 08/19/2013 1358   HGB 10.1* 02/12/2014 1600   HCT 32.5* 02/12/2014 1600   PLT 69* 02/12/2014 1600    BMET    Component Value Date/Time   NA 139 02/12/2014 1600   NA 144 08/19/2013 1358   K 4.1 02/12/2014 1600   CL 100 02/12/2014 1600   CO2 21 02/12/2014 1600   GLUCOSE 159* 02/12/2014 1600   GLUCOSE 134* 08/19/2013 1358   BUN 57* 02/12/2014 1600   BUN 49* 08/19/2013 1358   CREATININE 6.62* 02/12/2014 1600   CALCIUM 9.8 02/12/2014 1600   GFRNONAA 7* 02/12/2014 1600   GFRAA 9* 02/12/2014 [place], MD [facility] Office: [phone] Pager: [phone]  02/13/2014, 10:09 AM

## 2014-02-13 NOTE — Progress Notes (Signed)
Physical Therapy Treatment Patient Details Name: Adam Walsh MRN: [id number] DOB: January 15, 1941 Today's Date: 02/13/2014    History of Present Illness HPI: Adam Walsh is a 73 y.o. male the old with past medical history of end-stage renal disease on hemodialysis, myasthenia gravis, rheumatoid arthritis, HIT. The patient underwent dialysis day before admission which was uneventful and later in the evening noticed redness around his fistula. By this morning he was having fevers and generalized weakness along with muscle aches and joint pains.he decided to come to the ER where it was noted that his fistula appeared to be infected. He was evaluated with an ultrasound and is negative for an abscess. He is being admitted for this infection.  S/p R IJ catheter insertion and left AVF fistulogram and I&D 9/16.     PT Comments    Patient progressing well with mobility. Tolerated stair negotiation today with Min guard for safety. Mild unsteadiness noted during gait training. Pt continues to fatigues easily requiring longer rest breaks. Vitals stable throughout. Pt may not need RW for d/c if balance and gait continue to improve. Will continue to follow and progress as tolerated.    Follow Up Recommendations  Home health PT;Supervision/Assistance - 24 hour     Equipment Recommendations  Rolling walker with 5" wheels;3in1 (PT) (pending improvement.)    Recommendations for Other Services       Precautions / Restrictions Precautions Precautions: Fall Restrictions Weight Bearing Restrictions: No    Mobility  Bed Mobility Overal bed mobility: Modified Independent Bed Mobility: Supine to Sit     Supine to sit: Modified independent (Device/Increase time)     General bed mobility comments: HOB elevated, use of rails.  Transfers Overall transfer level: Needs assistance Equipment used: None Transfers: Sit to/from Stand Sit to Stand: Supervision         General transfer comment:  supervision for safety  Ambulation/Gait Ambulation/Gait assistance: Min guard Ambulation Distance (Feet): 75 Feet (+ 150' with rest break.) Assistive device: None Gait Pattern/deviations: Step-through pattern;Decreased stride length Gait velocity: decreased    General Gait Details: Initially pt wrapping arm around therapist for balance (does this with wife) progressing to no UE support during gait. No LOB. mildly unsteady but no overt LOB.   Stairs Stairs: Yes Stairs assistance: Min guard Stair Management: One rail Right;Alternating pattern Number of Stairs: 4 General stair comments: VC for safety and technique.   Wheelchair Mobility    Modified Rankin (Stroke Patients Only)       Balance Overall balance assessment: Needs assistance   Sitting balance-Leahy Scale: Good Sitting balance - Comments: Able to reach outside BoS without difficulty and donn socks/shoes without LOB.     Standing balance-Leahy Scale: Fair                      Cognition Arousal/Alertness: Awake/alert Behavior During Therapy: WFL for tasks assessed/performed Overall Cognitive Status: Within Functional Limits for tasks assessed                      Exercises Other Exercises Other Exercises: Performed sit<-> stand from recliner x7 with VC for slow descent for strengthening, no UE support.    General Comments        Pertinent Vitals/Pain Pain Assessment: No/denies pain    Home Living                      Prior Function  PT Goals (current goals can now be found in the care plan section) Progress towards PT goals: Progressing toward goals    Frequency       PT Plan Current plan remains appropriate    Co-evaluation             End of Session Equipment Utilized During Treatment: Gait belt Activity Tolerance: Patient tolerated treatment well Patient left: in chair;with call bell/phone within reach;with family/visitor present     Time:  1036-1100 PT Time Calculation (min): 24 min  Charges:  $Gait Training: 8-22 mins $Therapeutic Exercise: 8-22 mins                    G CodesCandy Sledge A Feb 19, 2014, 11:45 AM Candy Sledge, [place], DPT [phone]

## 2014-02-13 NOTE — Progress Notes (Signed)
UR complete. Ebrahim Deremer RN CCM Case Mgmt phone [phone] 

## 2014-02-13 NOTE — Progress Notes (Signed)
CARE MANAGEMENT NOTE 02/13/2014  Patient:  Adam Walsh, Adam Walsh   Account Number:  [id number]  Date Initiated:  02/10/2014  Documentation initiated by:  [facility]  Subjective/Objective Assessment:   ESRD, Sepsis with cellulitis     Action/Plan:   lives at home with wife, Jana Half   Anticipated DC Date:  02/14/2014   Anticipated DC Plan:  [place]  CM consult      Choice offered to / List presented to:             Status of service:  Completed, signed off Medicare Important Message given?  YES (If response is "NO", the following Medicare IM given date fields will be blank) Date Medicare IM given:  02/11/2014 Medicare IM given by:  [facility] Date Additional Medicare IM given:  02/13/2014 Additional Medicare IM given by:  Jonnie Finner  Discharge Disposition:  HOME/SELF CARE  Per UR Regulation:  Reviewed for med. necessity/level of care/duration of stay  If discussed at [place] of Stay Meetings, dates discussed:   02/13/2014    Comments:  02/13/2014 1400 NCM spoke to pt and gave permission to speak to wife, Jana Half. Wife states no DME or HH PT needed at this time. Waiting final recommendations for home for dressing changes at home. Wife states pt was receiving home dialysis and RN was coming to their home. Jonnie Finner RN CCM Case Mgmt phone [phone]

## 2014-02-13 NOTE — Progress Notes (Signed)
Pt refuses telemetry, states he doesn't see point in it and it hurts his HD cath.

## 2014-02-14 DIAGNOSIS — D696 Thrombocytopenia, unspecified: Secondary | ICD-10-CM

## 2014-02-14 DIAGNOSIS — M5380 Other specified dorsopathies, site unspecified: Secondary | ICD-10-CM

## 2014-02-14 DIAGNOSIS — M62830 Muscle spasm of back: Secondary | ICD-10-CM | POA: Diagnosis not present

## 2014-02-14 LAB — RENAL FUNCTION PANEL
ALBUMIN: 2.8 g/dL — AB (ref 3.5–5.2)
Anion gap: 14 (ref 5–15)
BUN: 50 mg/dL — AB (ref 6–23)
CHLORIDE: 98 meq/L (ref 96–112)
CO2: 24 mEq/L (ref 19–32)
Calcium: 9.9 mg/dL (ref 8.4–10.5)
Creatinine, Ser: 6.33 mg/dL — ABNORMAL HIGH (ref 0.50–1.35)
GFR calc Af Amer: 9 mL/min — ABNORMAL LOW (ref >90)
GFR calc non Af Amer: 8 mL/min — ABNORMAL LOW (ref >90)
GLUCOSE: 155 mg/dL — AB (ref 70–99)
Phosphorus: 4.5 mg/dL (ref 2.3–4.6)
Potassium: 4.6 mEq/L (ref 3.7–5.3)
Sodium: 136 mEq/L — ABNORMAL LOW (ref 137–147)

## 2014-02-14 LAB — CBC WITH DIFFERENTIAL/PLATELET
BASOS PCT: 0 % (ref 0–1)
Basophils Absolute: 0 10*3/uL (ref 0.0–0.1)
Eosinophils Absolute: 0.1 10*3/uL (ref 0.0–0.7)
Eosinophils Relative: 2 % (ref 0–5)
HEMATOCRIT: 33.1 % — AB (ref 39.0–52.0)
HEMOGLOBIN: 10.5 g/dL — AB (ref 13.0–17.0)
LYMPHS ABS: 1.5 10*3/uL (ref 0.7–4.0)
Lymphocytes Relative: 27 % (ref 12–46)
MCH: 30.6 pg (ref 26.0–34.0)
MCHC: 31.7 g/dL (ref 30.0–36.0)
MCV: 96.5 fL (ref 78.0–100.0)
MONO ABS: 0 10*3/uL — AB (ref 0.1–1.0)
MONOS PCT: 0 % — AB (ref 3–12)
NEUTROS ABS: 3.8 10*3/uL (ref 1.7–7.7)
Neutrophils Relative %: 71 % (ref 43–77)
Platelets: 56 10*3/uL — ABNORMAL LOW (ref 150–400)
RBC: 3.43 MIL/uL — ABNORMAL LOW (ref 4.22–5.81)
RDW: 14.6 % (ref 11.5–15.5)
WBC: 5.4 10*3/uL (ref 4.0–10.5)

## 2014-02-14 MED ORDER — OXYCODONE HCL 5 MG PO TABS: 5.0000 mg | ORAL_TABLET | Freq: Once | ORAL | Status: AC | PRN

## 2014-02-14 MED ORDER — ALTEPLASE 2 MG IJ SOLR: 2.0000 mg | Freq: Once | INTRAMUSCULAR | Status: DC | PRN

## 2014-02-14 MED ORDER — PROMETHAZINE HCL 25 MG/ML IJ SOLN: 6.2500 mg | INTRAMUSCULAR | Status: DC | PRN

## 2014-02-14 MED ORDER — SODIUM CHLORIDE 0.9 % IV SOLN
100.0000 mL | INTRAVENOUS | Status: DC | PRN
Start: 2014-02-14 — End: 2014-02-14

## 2014-02-14 MED ORDER — LIDOCAINE-PRILOCAINE 2.5-2.5 % EX CREA: 1.0000 "application " | TOPICAL_CREAM | CUTANEOUS | Status: DC | PRN

## 2014-02-14 MED ORDER — NEPRO/CARBSTEADY PO LIQD: 237.0000 mL | ORAL | Status: DC | PRN

## 2014-02-14 MED ORDER — VANCOMYCIN HCL 10 G IV SOLR
1250.0000 mg | Freq: Once | INTRAVENOUS | Status: AC
  Administered 2014-02-14: 1250 mg via INTRAVENOUS
  Filled 2014-02-14: qty 1250

## 2014-02-14 MED ORDER — METOPROLOL SUCCINATE 12.5 MG HALF TABLET
12.5000 mg | ORAL_TABLET | Freq: Two times a day (BID) | ORAL | Status: DC
  Administered 2014-02-14: 12.5 mg via ORAL
  Filled 2014-02-14 (×5): qty 1

## 2014-02-14 MED ORDER — PENTAFLUOROPROP-TETRAFLUOROETH EX AERO
1.0000 | INHALATION_SPRAY | CUTANEOUS | Status: DC | PRN
Start: 2014-02-14 — End: 2014-02-14

## 2014-02-14 MED ORDER — OXYCODONE HCL 5 MG/5ML PO SOLN: 5.0000 mg | Freq: Once | ORAL | Status: AC | PRN

## 2014-02-14 MED ORDER — ANTICOAGULANT SODIUM CITRATE 4% (200MG/5ML) IV SOLN
5.0000 mL | Freq: Once | Status: DC
  Filled 2014-02-14 (×3): qty 250

## 2014-02-14 MED ORDER — VANCOMYCIN HCL IN DEXTROSE 1-5 GM/200ML-% IV SOLN: 1000.0000 mg | INTRAVENOUS | Status: DC

## 2014-02-14 MED ORDER — HYDROMORPHONE HCL 1 MG/ML IJ SOLN: 0.2500 mg | INTRAMUSCULAR | Status: DC | PRN

## 2014-02-14 MED ORDER — LIDOCAINE HCL (PF) 1 % IJ SOLN: 5.0000 mL | INTRAMUSCULAR | Status: DC | PRN

## 2014-02-14 MED ORDER — CYCLOBENZAPRINE HCL 10 MG PO TABS
5.0000 mg | ORAL_TABLET | Freq: Three times a day (TID) | ORAL | Status: DC | PRN
  Administered 2014-02-14: 5 mg via ORAL
  Filled 2014-02-14: qty 1

## 2014-02-14 NOTE — Progress Notes (Addendum)
INFECTIOUS DISEASE PROGRESS NOTE  ID: Adam Walsh is a 73 y.o. male with  Principal Problem:   Staphylococcus aureus bacteremia Active Problems:   End stage renal disease on dialysis   HIT (heparin-induced thrombocytopenia)   Hypertension   Celiac sprue   Cellulitis of arm, left   Rheumatoid arthritis   Sepsis   Myasthenia exacerbation   Macrocytic anemia   Back spasm   Thrombocytopenia, unspecified  Subjective: No nose or gum bleeds.   Abtx:  Anti-infectives   Start     Dose/Rate Route Frequency Ordered Stop   02/10/14 1400  ceFAZolin (ANCEF) IVPB 2 g/50 mL premix     2 g 100 mL/hr over 30 Minutes Intravenous Every M-W-F (Hemodialysis) 02/10/14 1353     02/10/14 1200  vancomycin (VANCOCIN) IVPB 1000 mg/200 mL premix  Status:  Discontinued     1,000 mg 200 mL/hr over 60 Minutes Intravenous Every M-W-F (Hemodialysis) 02/09/14 1229 02/11/14 0951   02/09/14 1600  vancomycin (VANCOCIN) IVPB 750 mg/150 ml premix     750 mg 150 mL/hr over 60 Minutes Intravenous  Once 02/09/14 1459 02/09/14 1813   02/09/14 1500  ceFAZolin (ANCEF) IVPB 2 g/50 mL premix     2 g 100 mL/hr over 30 Minutes Intravenous  Once 02/09/14 1434 02/09/14 1626   02/08/14 0645  vancomycin (VANCOCIN) IVPB 1000 mg/200 mL premix     1,000 mg 200 mL/hr over 60 Minutes Intravenous  Once 02/08/14 0644 02/08/14 0851   02/08/14 0645  piperacillin-tazobactam (ZOSYN) IVPB 2.25 g     2.25 g 100 mL/hr over 30 Minutes Intravenous  Once 02/08/14 0644 02/08/14 0932      Medications:  Scheduled: . aspirin  81 mg Oral Daily  . calcium acetate  1,334 mg Oral 5 X Daily  .  ceFAZolin (ANCEF) IV  2 g Intravenous Q M,W,F-HD  . darbepoetin (ARANESP) injection - DIALYSIS  60 mcg Intravenous Q Wed-HD  . folic acid  1 mg Oral Daily  . gabapentin  300 mg Oral QHS  . metoprolol succinate  12.5 mg Oral BID  . multivitamin  1 tablet Oral QHS  . pantoprazole  40 mg Oral Daily  . predniSONE  15 mg Oral Q breakfast  .  sodium chloride  3 mL Intravenous Q12H    Objective: Vital signs in last 24 hours: Temp:  [97.8 F (36.6 C)-100 F (37.8 C)] 98.5 F (36.9 C) (09/18 1145) Pulse Rate:  [48-73] 56 (09/18 1145) Resp:  [18] 18 (09/18 1145) BP: (95-130)/(54-71) 95/54 mmHg (09/18 1145) SpO2:  [94 %-98 %] 98 % (09/18 1145) Weight:  [79.2 kg (174 lb 9.7 oz)-82.2 kg (181 lb 3.5 oz)] 79.2 kg (174 lb 9.7 oz) (09/18 1145)   General appearance: alert, cooperative and no distress Resp: clear to auscultation bilaterally Cardio: regular rate and rhythm GI: normal findings: bowel sounds normal and soft, non-tender Extremities: edema +bruit LUE.   Lab Results  Recent Labs  02/12/14 1600 02/14/14 0720 02/14/14 0728  WBC 2.5* 5.4  --   HGB 10.1* 10.5*  --   HCT 32.5* 33.1*  --   NA 139  --  136*  K 4.1  --  4.6  CL 100  --  98  CO2 21  --  24  BUN 57*  --  50*  CREATININE 6.62*  --  6.33*   Liver Panel  Recent Labs  02/12/14 1600 02/14/14 0728  ALBUMIN 2.6* 2.8*   Sedimentation Rate No results  found for this basename: ESRSEDRATE,  in the last 72 hours C-Reactive Protein No results found for this basename: CRP,  in the last 72 hours  Microbiology: Recent Results (from the past 240 hour(s))  CULTURE, BLOOD (ROUTINE X 2)     Status: None   Collection Time    02/08/14  6:29 AM      Result Value Ref Range Status   Specimen Description BLOOD RIGHT ARM   Final   Special Requests BOTTLES DRAWN AEROBIC AND ANAEROBIC 5CC   Final   Culture  Setup Time     Final   Value: 02/08/2014 15:16     Performed at [facility]   Culture     Final   Value: STAPHYLOCOCCUS AUREUS     Note: SUSCEPTIBILITIES PERFORMED ON PREVIOUS CULTURE WITHIN THE LAST 5 DAYS.     Note: Gram Stain Report Called to,Read Back By and Verified With: Sima Matas RN on 02/09/14 at 02:00 by Rise Mu     Performed at [facility]   Report Status 02/11/2014 FINAL   Final  CULTURE, BLOOD (ROUTINE X 2)     Status:  None   Collection Time    02/08/14  6:45 AM      Result Value Ref Range Status   Specimen Description BLOOD RIGHT ARM   Final   Special Requests BOTTLES DRAWN AEROBIC AND ANAEROBIC [facility]   Final   Culture  Setup Time     Final   Value: 02/08/2014 15:18     Performed at [facility]   Culture     Final   Value: STAPHYLOCOCCUS AUREUS     Note: RIFAMPIN AND GENTAMICIN SHOULD NOT BE USED AS SINGLE DRUGS FOR TREATMENT OF STAPH INFECTIONS.     Note: Gram Stain Report Called to,Read Back By and Verified With: Sima Matas RN on 02/09/14 at 02:00 by Rise Mu     Performed at [facility]   Report Status 02/11/2014 FINAL   Final   Organism ID, Bacteria STAPHYLOCOCCUS AUREUS   Final  CULTURE, BLOOD (ROUTINE X 2)     Status: None   Collection Time    02/09/14  6:30 PM      Result Value Ref Range Status   Specimen Description BLOOD RIGHT ARM   Final   Special Requests BOTTLES DRAWN AEROBIC AND ANAEROBIC 5CC EA   Final   Culture  Setup Time     Final   Value: 02/10/2014 00:49     Performed at [facility]   Culture     Final   Value:        BLOOD CULTURE RECEIVED NO GROWTH TO DATE CULTURE WILL BE HELD FOR 5 DAYS BEFORE ISSUING A FINAL NEGATIVE REPORT     Performed at [facility]   Report Status PENDING   Incomplete  CULTURE, BLOOD (ROUTINE X 2)     Status: None   Collection Time    02/09/14  6:45 PM      Result Value Ref Range Status   Specimen Description BLOOD RIGHT HAND   Final   Special Requests BOTTLES DRAWN AEROBIC AND ANAEROBIC 5CC EA   Final   Culture  Setup Time     Final   Value: 02/10/2014 00:49     Performed at [facility]   Culture     Final   Value:        BLOOD CULTURE RECEIVED NO GROWTH TO DATE CULTURE  WILL BE HELD FOR 5 DAYS BEFORE ISSUING A FINAL NEGATIVE REPORT     Note: Culture results may be compromised due to an excessive volume of blood received in culture bottles.     Performed at [facility]   Report Status  PENDING   Incomplete  CULTURE, ROUTINE-ABSCESS     Status: None   Collection Time    02/12/14  1:04 PM      Result Value Ref Range Status   Specimen Description ABSCESS ARM LEFT   Final   Special Requests PT ON ANCEF VANCO   Final   Gram Stain     Final   Value: ABUNDANT WBC PRESENT,BOTH PMN AND MONONUCLEAR     NO SQUAMOUS EPITHELIAL CELLS SEEN     FEW GRAM POSITIVE COCCI     IN PAIRS     Performed at [facility]   Culture     Final   Value: FEW STAPHYLOCOCCUS AUREUS     Note: RIFAMPIN AND GENTAMICIN SHOULD NOT BE USED AS SINGLE DRUGS FOR TREATMENT OF STAPH INFECTIONS.     Performed at [facility]   Report Status PENDING   Incomplete  ANAEROBIC CULTURE     Status: None   Collection Time    02/12/14  1:04 PM      Result Value Ref Range Status   Specimen Description ABSCESS ARM LEFT   Final   Special Requests PT ON ANCEF VANCO   Final   Gram Stain     Final   Value: ABUNDANT WBC PRESENT,BOTH PMN AND MONONUCLEAR     NO SQUAMOUS EPITHELIAL CELLS SEEN     FEW GRAM POSITIVE COCCI     IN PAIRS     Performed at [facility]   Culture     Final   Value: NO ANAEROBES ISOLATED; CULTURE IN PROGRESS FOR 5 DAYS     Performed at [facility]   Report Status PENDING   Incomplete    Studies/Results: Dg Chest Port 1 View  02/12/2014   CLINICAL DATA:  Central catheter placement  EXAM: PORTABLE CHEST - 1 VIEW  COMPARISON:  February 08, 2014  FINDINGS: Central catheter tip is at the cavoatrial junction. No pneumothorax. There is underlying emphysematous change. There is mild scarring in the left base. There is no edema or consolidation. Heart is upper normal in size with pulmonary vascularity within normal limits. No adenopathy.  IMPRESSION: Central catheter tip at cavoatrial junction. No pneumothorax. No edema or consolidation. Underlying emphysema. Scarring left base.   Electronically Signed   By: Lowella Grip M.D.   On: 02/12/2014 15:05      Assessment/Plan: Staph/MSSA bacteremia  ESRD  Fistula infection? CT (-)  RA on prednisone, methotrexate on hold  Previous HIT (2013)  Total days of antibiotics: 6 ancef  TEE (9-16) negative  AVF debridement 9-16 temporary HD line in R chest  Repeat BCx 9-13 ngtd. His wound Cx is growing Staph aurues, this is concerning that Cx was + on anbx.  Plt are dropping.  Will change ancef to vanco. Not clear that he is not getting heparin? Greatly appreciate pharm assistance with this.   Consider mri spine due to back pain?  Plan for 3 more weeks of anbx  [name] Infectious Diseases (pager) [phone] [email or web address] 02/14/2014, 1:59 PM  LOS: 6 days

## 2014-02-14 NOTE — Progress Notes (Signed)
PT Cancellation Note  Patient Details Name: Adam Walsh MRN: [id number] DOB: [birth year]   Cancelled Treatment:    Reason Eval/Treat Not Completed: Patient at procedure or test/unavailable  Pt off floor at HD. Will check on patient later in PM if time allows.  Candy Sledge A 02/14/2014, 9:02 AM Candy Sledge, PT, DPT [phone]

## 2014-02-14 NOTE — Progress Notes (Addendum)
ANTIBIOTIC CONSULT NOTE - FOLLOW UP  Pharmacy Consult for Vancomcyin Indication: MSSA bacteremia d/t infected AVF  Allergies  Allergen Reactions  . Avelox [Moxifloxacin Hcl In Nacl]     Pt quit breathing  . Gluten   . Gluten Meal Anaphylaxis  . Ciprofloxacin Other (See Comments)    myasthenia  . Erythromycin Other (See Comments)    myasthenia  . Erythromycin Base Other (See Comments)    Myasthenia gravis  . Gemifloxacin Other (See Comments)    myasthenia  . Gentamicin Other (See Comments)    myasthenia  . Heparin Other (See Comments)    myasthenia  . Kanamycin Other (See Comments)    myasthenia  . Levofloxacin Other (See Comments)    myasthenia  . 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 Neomycin Other (See Comments)    myasthenia  . Norfloxacin Other (See Comments)    myasthenia  . Quinolones Other (See Comments)    Myasthenia gravis  . Streptomycin Other (See Comments)    myasthenia  . Tobramycin Other (See Comments)    myasthenia    Patient Measurements: Height: 5' 10.08" (178 cm) Weight: 174 lb 9.7 oz (79.2 kg) IBW/kg (Calculated) : 73.18  Vital Signs: Temp: 98.5 F (36.9 C) (09/18 1145) Temp src: Oral (09/18 1145) BP: 95/54 mmHg (09/18 1145) Pulse Rate: 56 (09/18 1145) Intake/Output from previous day: 09/17 0701 - 09/18 0700 In: 600 [P.O.:600] Out: 0  Intake/Output from this shift: Total I/O In: -  Out: 2000 [Other:2000]  Labs:  Recent Labs  02/11/14 2335 02/12/14 1600 02/14/14 0720 02/14/14 0728  WBC  --  2.5* 5.4  --   HGB  --  10.1* 10.5*  --   PLT  --  69* 56*  --   CREATININE 5.82* 6.62*  --  6.33*   Estimated Creatinine Clearance: 11.1 ml/min (by C-G formula based on Cr of 6.33). No results found for this basename: VANCOTROUGH, Corlis Leak, VANCORANDOM, Story, GENTPEAK, GENTRANDOM, TOBRATROUGH, TOBRAPEAK, TOBRARND, AMIKACINPEAK, AMIKACINTROU, AMIKACIN,  in the last  72 hours   Microbiology: Recent Results (from the past 720 hour(s))  CULTURE, BLOOD (ROUTINE X 2)     Status: None   Collection Time    02/08/14  6:29 AM      Result Value Ref Range Status   Specimen Description BLOOD RIGHT ARM   Final   Special Requests BOTTLES DRAWN AEROBIC AND ANAEROBIC 5CC   Final   Culture  Setup Time     Final   Value: 02/08/2014 15:16     Performed at [facility]   Culture     Final   Value: STAPHYLOCOCCUS AUREUS     Note: SUSCEPTIBILITIES PERFORMED ON PREVIOUS CULTURE WITHIN THE LAST 5 DAYS.     Note: Gram Stain Report Called to,Read Back By and Verified With: Sima Matas RN on 02/09/14 at 02:00 by Rise Mu     Performed at [facility]   Report Status 02/11/2014 FINAL   Final  CULTURE, BLOOD (ROUTINE X 2)     Status: None   Collection Time    02/08/14  6:45 AM      Result Value Ref Range Status   Specimen Description BLOOD RIGHT ARM   Final   Special Requests BOTTLES DRAWN AEROBIC AND ANAEROBIC [facility]   Final   Culture  Setup Time     Final   Value: 02/08/2014 15:18     Performed at  [facility]     Final   Value: STAPHYLOCOCCUS AUREUS     Note: RIFAMPIN AND GENTAMICIN SHOULD NOT BE USED AS SINGLE DRUGS FOR TREATMENT OF STAPH INFECTIONS.     Note: Gram Stain Report Called to,Read Back By and Verified With: Sima Matas RN on 02/09/14 at 02:00 by Rise Mu     Performed at [facility]   Report Status 02/11/2014 FINAL   Final   Organism ID, Bacteria STAPHYLOCOCCUS AUREUS   Final  CULTURE, BLOOD (ROUTINE X 2)     Status: None   Collection Time    02/09/14  6:30 PM      Result Value Ref Range Status   Specimen Description BLOOD RIGHT ARM   Final   Special Requests BOTTLES DRAWN AEROBIC AND ANAEROBIC 5CC EA   Final   Culture  Setup Time     Final   Value: 02/10/2014 00:49     Performed at [facility]   Culture     Final   Value:        BLOOD CULTURE RECEIVED NO GROWTH TO DATE CULTURE WILL BE  HELD FOR 5 DAYS BEFORE ISSUING A FINAL NEGATIVE REPORT     Performed at [facility]   Report Status PENDING   Incomplete  CULTURE, BLOOD (ROUTINE X 2)     Status: None   Collection Time    02/09/14  6:45 PM      Result Value Ref Range Status   Specimen Description BLOOD RIGHT HAND   Final   Special Requests BOTTLES DRAWN AEROBIC AND ANAEROBIC 5CC EA   Final   Culture  Setup Time     Final   Value: 02/10/2014 00:49     Performed at [facility]   Culture     Final   Value:        BLOOD CULTURE RECEIVED NO GROWTH TO DATE CULTURE WILL BE HELD FOR 5 DAYS BEFORE ISSUING A FINAL NEGATIVE REPORT     Note: Culture results may be compromised due to an excessive volume of blood received in culture bottles.     Performed at [facility]   Report Status PENDING   Incomplete  CULTURE, ROUTINE-ABSCESS     Status: None   Collection Time    02/12/14  1:04 PM      Result Value Ref Range Status   Specimen Description ABSCESS ARM LEFT   Final   Special Requests PT ON ANCEF VANCO   Final   Gram Stain     Final   Value: ABUNDANT WBC PRESENT,BOTH PMN AND MONONUCLEAR     NO SQUAMOUS EPITHELIAL CELLS SEEN     FEW GRAM POSITIVE COCCI     IN PAIRS     Performed at [facility]   Culture     Final   Value: FEW STAPHYLOCOCCUS AUREUS     Note: RIFAMPIN AND GENTAMICIN SHOULD NOT BE USED AS SINGLE DRUGS FOR TREATMENT OF STAPH INFECTIONS.     Performed at [facility]   Report Status PENDING   Incomplete  ANAEROBIC CULTURE     Status: None   Collection Time    02/12/14  1:04 PM      Result Value Ref Range Status   Specimen Description ABSCESS ARM LEFT   Final   Special Requests PT ON ANCEF VANCO   Final   Gram Stain     Final   Value:  ABUNDANT WBC PRESENT,BOTH PMN AND MONONUCLEAR     NO SQUAMOUS EPITHELIAL CELLS SEEN     FEW GRAM POSITIVE COCCI     IN PAIRS     Performed at [facility]   Culture     Final   Value: NO ANAEROBES ISOLATED; CULTURE IN  PROGRESS FOR 5 DAYS     Performed at [facility]   Report Status PENDING   Incomplete    Anti-infectives   Start     Dose/Rate Route Frequency Ordered Stop   02/10/14 1400  ceFAZolin (ANCEF) IVPB 2 g/50 mL premix     2 g 100 mL/hr over 30 Minutes Intravenous Every M-W-F (Hemodialysis) 02/10/14 1353     02/10/14 1200  vancomycin (VANCOCIN) IVPB 1000 mg/200 mL premix  Status:  Discontinued     1,000 mg 200 mL/hr over 60 Minutes Intravenous Every M-W-F (Hemodialysis) 02/09/14 1229 02/11/14 0951   02/09/14 1600  vancomycin (VANCOCIN) IVPB 750 mg/150 ml premix     750 mg 150 mL/hr over 60 Minutes Intravenous  Once 02/09/14 1459 02/09/14 1813   02/09/14 1500  ceFAZolin (ANCEF) IVPB 2 g/50 mL premix     2 g 100 mL/hr over 30 Minutes Intravenous  Once 02/09/14 1434 02/09/14 1626   02/08/14 0645  vancomycin (VANCOCIN) IVPB 1000 mg/200 mL premix     1,000 mg 200 mL/hr over 60 Minutes Intravenous  Once 02/08/14 0644 02/08/14 0851   02/08/14 0645  piperacillin-tazobactam (ZOSYN) IVPB 2.25 g     2.25 g 100 mL/hr over 30 Minutes Intravenous  Once 02/08/14 0644 02/08/14 0932      Assessment: 50 YOM with ESRD who was on Ancef for MSSA bacteremia in the setting of an infected AVF with noted decline in platelets. ID on board - now plans are to switch to Vancomycin while awaiting platelet recovery. TTE and TEE negative for vegetations. Imaging of AVF did not show abscess however did show cellulitis - s/p I&D on 9/16 with cultures growing Staph Aureus however sensitivities still pending.  The patient was on Vancomycin previously this admission. The last dose was on 9/14 - the patient has received 2 HD sessions since that dose was given. The doses/HD sessions are as follows: * Vancomycin doses: 1750 mg LD on 9/13, 1g MD post HD on 9/14 * HD sessions: 9/14 (3.5 hr BFR 350), 9/16 (4 hr BFR 300), 9/18 (4 hr BFR 400)  It is estimated that the patient may still have some residual Vancomycin on  board from prior dosing that has not yet been fully removed with Hemodialysis. With this in mind, will re-load with a slightly lower dose then start maintenance doses.   The patient's normal HD schedule appears to be Mon/Wed/Fri/Sat however it is unclear if the patient will receive dialysis on Saturday while inpatient. Will continue with scheduled Vanc on MWF and will f/u plans for HD tomorrow (Saturday). ID planning to treat for 4 weeks.  Goal of Therapy:  Proper antibiotics for infection/cultures adjusted for renal/hepatic function   Plan:  1. Vancomycin 1250 mg x 1 dose to re-load 2. Vancomycin 1g post HD-MWF (will f/u plans for an additional dose on Sat if the patient goes to HD this day) 3. Will continue to follow HD schedule/duration, culture results, LOT, and antibiotic de-escalation plans   Alycia Rossetti, PharmD, BCPS Clinical Pharmacist Pager: [phone] 02/14/2014 12:07 PM

## 2014-02-14 NOTE — Progress Notes (Signed)
Pt left floor via wheelchair accompanied by staff. 

## 2014-02-14 NOTE — Progress Notes (Signed)
Physical Therapy Treatment Patient Details Name: Adam Walsh MRN: [id number] DOB: 03-Feb-1941 Today's Date: 02/14/2014    History of Present Illness HPI: Adam Walsh is a 73 y.o. male the old with past medical history of end-stage renal disease on hemodialysis, myasthenia gravis, rheumatoid arthritis, HIT. The patient underwent dialysis day before admission which was uneventful and later in the evening noticed redness around his fistula. By this morning he was having fevers and generalized weakness along with muscle aches and joint pains.he decided to come to the ER where it was noted that his fistula appeared to be infected. He was evaluated with an ultrasound and is negative for an abscess. He is being admitted for this infection.  S/p R IJ catheter insertion and left AVF fistulogram and I&D 9/16.     PT Comments    Patient progressing well with mobility. Tolerated less rest breaks during gait training and able to negotiate steps with Min guard for safety. Required encouragement to participate due to back pain. Encourage mobility daily with wife or nursing to maintain strength and mobility while in hospital. Continues to exhibit balance deficits - will perform higher level balance challenges next session as tolerated.   Follow Up Recommendations  Home health PT;Supervision/Assistance - 24 hour     Equipment Recommendations  None recommended by PT    Recommendations for Other Services       Precautions / Restrictions Precautions Precautions: Fall Restrictions Weight Bearing Restrictions: No    Mobility  Bed Mobility               General bed mobility comments: Received sitting in chair upon PT arrival.  Transfers Overall transfer level: Needs assistance Equipment used: None Transfers: Sit to/from Stand Sit to Stand: Supervision         General transfer comment: supervision for safety  Ambulation/Gait Ambulation/Gait assistance: Min guard Ambulation  Distance (Feet): 150 Feet Assistive device: None Gait Pattern/deviations: Step-through pattern;Decreased stride length;Shuffle Gait velocity: 1.8 ft/sec   General Gait Details: Shuffling of Bil feet on ground causing stubbing of toe, VC to pick up feet to avoid tripping. Holding onto rail for support as needed. Mildly unsteady but no LOB. Mild dyspnea noted however difficult to obtain Sa02.   Stairs Stairs: Yes Stairs assistance: Min guard Stair Management: Alternating pattern;One rail Right Number of Stairs: 6 General stair comments: VC for safety and technique.   Wheelchair Mobility    Modified Rankin (Stroke Patients Only)       Balance Overall balance assessment: Needs assistance   Sitting balance-Leahy Scale: Good Sitting balance - Comments: Able to reach outside BoS without difficulty and donn socks/shoes without LOB.     Standing balance-Leahy Scale: Fair                      Cognition Arousal/Alertness: Awake/alert Behavior During Therapy: WFL for tasks assessed/performed Overall Cognitive Status: Within Functional Limits for tasks assessed                      Exercises      General Comments        Pertinent Vitals/Pain Pain Assessment: 0-10 Pain Score:  (not rated on pain scale.) Pain Location: back pain Pain Descriptors / Indicators: Aching;Sore ("feels like I got a crick in my back.") Pain Intervention(s): Monitored during session;Premedicated before session;Repositioned;Heat applied    Home Living  Prior Function            PT Goals (current goals can now be found in the care plan section) Progress towards PT goals: Progressing toward goals    Frequency       PT Plan Current plan remains appropriate    Co-evaluation             End of Session Equipment Utilized During Treatment: Gait belt Activity Tolerance: Patient limited by pain Patient left: in chair;with call bell/phone within  reach;with family/visitor present     Time: 1536-1550 PT Time Calculation (min): 14 min  Charges:  $Gait Training: 8-22 mins                    G CodesCandy Sledge A 03-14-14, 3:51 PM Candy Sledge, [place], DPT [phone]

## 2014-02-14 NOTE — Procedures (Signed)
Patient was seen on dialysis and the procedure was supervised. BFR 400 Via RIJ TDC BP is 95/61.  Patient appears to be tolerating treatment well.  Awaiting final culture and sensitivity of abscess on AVF.

## 2014-02-14 NOTE — Progress Notes (Signed)
PT Cancellation Note  Patient Details Name: Adam Walsh MRN: [id number] DOB: 25-Apr-1941   Cancelled Treatment:    Reason Eval/Treat Not Completed: Patient declined, no reason specified Pt declined participating in therapy today due to having back/hip pain. Asked for therapist to check back later- will try if time allows.   Candy Sledge A 02/14/2014, 1:50 PM Candy Sledge, [place], DPT [phone]

## 2014-02-14 NOTE — Progress Notes (Signed)
Pt IV RAC infiltrated. Per wife request paged IV team, pt is terrible stick and has left limb alert.

## 2014-02-14 NOTE — Progress Notes (Signed)
Patient Demographics  Adam Walsh, is a 73 y.o. male, DOB - Oct 27, 1940, LDJ:[id number]  Admit date - 02/08/2014   Admitting Physician Debbe Odea, MD  Outpatient Primary MD for the patient is Donnie Coffin, MD  LOS - 6   Chief Complaint  Patient presents with  . Vascular Access Problem  . Generalized Body Aches        Subjective:   Pain controlled. No f/c/n/v   Assessment & Plan    1. MSSA sepsis secondary to infected AVF. TEE negative. Dr. Bridgett Larsson debrided area: 20 cc pus. 3 weeks ancef. Per [facility] keep in house until able to see some healing of would  Pancytopenia: monitor  2. ESRD. Dialyzes 4 times a week at home, per renal  3. Myasthenia gravis and rheumatoid arthritis. Stable. 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   5. History of gluten sensitivity. On gluten free diet.  6. SVT during dialysis 9 4015. On acute EKG, echo shows EF of 55% with grade 1 chronic diastolic CHF, stable TSH, cardiology consulted, currently placed on beta blocker. Monitor.  Code Status: DO NOT RESUSCITATE  Family Communication: wife at bedside  Disposition Plan:    Procedures  TEE  1. Right internal jugular vein tunneled dialysis catheter placement  2. Right internal jugular vein cannulation under ultrasound guidance  3. Left arm fistulogram  4. Incision and drainage of left arm abscess   Consults  Renal, Vas Surgery, Cards, ID   Medications  Scheduled Meds: . aspirin  81 mg Oral Daily  . calcium acetate  1,334 mg Oral 5 X Daily  .  ceFAZolin (ANCEF) IV  2 g Intravenous Q M,W,F-HD  . darbepoetin (ARANESP) injection - DIALYSIS  60 mcg Intravenous Q Wed-HD  . folic acid  1 mg Oral Daily  . gabapentin  300 mg Oral QHS  . metoprolol succinate   25 mg Oral BID  . multivitamin  1 tablet Oral QHS  . pantoprazole  40 mg Oral Daily  . predniSONE  15 mg Oral Q breakfast  . sodium chloride  3 mL Intravenous Q12H   Continuous Infusions:   PRN Meds:.sodium chloride, sodium chloride, sodium chloride, acetaminophen, alum & mag hydroxide-simeth, feeding supplement (NEPRO CARB STEADY), HYDROcodone-acetaminophen, Influenza vac split quadrivalent PF, lidocaine (PF), lidocaine-prilocaine, metoprolol, ondansetron (ZOFRAN) IV, pentafluoroprop-tetrafluoroeth, sodium chloride  DVT Prophylaxis  SCD (  H/O possible HIT)  Lab Results  Component Value Date   PLT 69* 02/12/2014    Antibiotics     Anti-infectives   Start     Dose/Rate Route Frequency Ordered Stop   02/10/14 1400  ceFAZolin (ANCEF) IVPB 2 g/50 mL premix     2 g 100 mL/hr over 30 Minutes Intravenous Every M-W-F (Hemodialysis) 02/10/14 1353     02/10/14 1200  vancomycin (VANCOCIN) IVPB 1000 mg/200 mL premix  Status:  Discontinued     1,000 mg 200 mL/hr over 60 Minutes Intravenous Every M-W-F (Hemodialysis) 02/09/14 1229 02/11/14 0951   02/09/14 1600  vancomycin (VANCOCIN) IVPB 750 mg/150 ml premix     750 mg 150 mL/hr over 60 Minutes Intravenous  Once 02/09/14 1459 02/09/14 1813   02/09/14 1500  ceFAZolin (ANCEF) IVPB 2 g/50 mL premix     2 g 100 mL/hr over 30 Minutes Intravenous  Once 02/09/14 1434 02/09/14 1626   02/08/14 0645  vancomycin (VANCOCIN) IVPB 1000 mg/200 mL premix     1,000 mg 200 mL/hr over 60 Minutes Intravenous  Once 02/08/14 0644 02/08/14 0851   02/08/14 0645  piperacillin-tazobactam (ZOSYN) IVPB 2.25 g     2.25 g 100 mL/hr over 30 Minutes Intravenous  Once 02/08/14 0644 02/08/14 0932          Objective:   Filed Vitals:   02/13/14 0513 02/13/14 0913 02/13/14 1802 02/13/14 2100  BP: 111/55 105/57 122/65 117/60  Pulse: 61 64 63 61  Temp: 99.1 F (37.3 C) 99.2 F (37.3 C) 98.2 F (36.8 C) 99.2 F (37.3 C)  TempSrc:  Oral Oral Oral  Resp: 18 18 18  18   Height:      Weight:      SpO2: 94% 95% 94% 96%    Wt Readings from Last 3 Encounters:  02/12/14 82.8 kg (182 lb 8.7 oz)  02/12/14 82.8 kg (182 lb 8.7 oz)  02/12/14 82.8 kg (182 lb 8.7 oz)     Intake/Output Summary (Last 24 hours) at 02/14/14 0620 Last data filed at 02/13/14 1814  Gross per 24 hour  Intake    600 ml  Output      0 ml  Net    600 ml     Physical Exam  Awake Alert, slightly groggy. Oriented and appropriate Symmetrical CTAB without WRR RRR,No Gallops,Rubs or new Murmurs, +ve B.Sounds, Abd Soft, No tenderness, No organomegaly appriciated, No rebound - guarding or rigidity. No Cyanosis, Clubbing or edema, No new Rash or bruise. Left arm with dressing CDI  Data Review   Micro Results Recent Results (from the past 240 hour(s))  CULTURE, BLOOD (ROUTINE X 2)     Status: None   Collection Time    02/08/14  6:29 AM      Result Value Ref Range Status   Specimen Description BLOOD RIGHT ARM   Final   Special Requests BOTTLES DRAWN AEROBIC AND ANAEROBIC 5CC   Final   Culture  Setup Time     Final   Value: 02/08/2014 15:16     Performed at [facility]   Culture     Final   Value: STAPHYLOCOCCUS AUREUS     Note: SUSCEPTIBILITIES PERFORMED ON PREVIOUS CULTURE WITHIN THE LAST 5 DAYS.     Note: Gram Stain Report Called to,Read Back By and Verified With: Sima Matas RN on 02/09/14 at 02:00 by Rise Mu     Performed at [facility]   Report Status 02/11/2014 FINAL  Final  CULTURE, BLOOD (ROUTINE X 2)     Status: None   Collection Time    02/08/14  6:45 AM      Result Value Ref Range Status   Specimen Description BLOOD RIGHT ARM   Final   Special Requests BOTTLES DRAWN AEROBIC AND ANAEROBIC [facility]   Final   Culture  Setup Time     Final   Value: 02/08/2014 15:18     Performed at [facility]   Culture     Final   Value: STAPHYLOCOCCUS AUREUS     Note: RIFAMPIN AND GENTAMICIN SHOULD NOT BE USED AS SINGLE DRUGS FOR TREATMENT OF STAPH  INFECTIONS.     Note: Gram Stain Report Called to,Read Back By and Verified With: Sima Matas RN on 02/09/14 at 02:00 by Rise Mu     Performed at [facility]   Report Status 02/11/2014 FINAL   Final   Organism ID, Bacteria STAPHYLOCOCCUS AUREUS   Final  CULTURE, BLOOD (ROUTINE X 2)     Status: None   Collection Time    02/09/14  6:30 PM      Result Value Ref Range Status   Specimen Description BLOOD RIGHT ARM   Final   Special Requests BOTTLES DRAWN AEROBIC AND ANAEROBIC 5CC EA   Final   Culture  Setup Time     Final   Value: 02/10/2014 00:49     Performed at [facility]   Culture     Final   Value:        BLOOD CULTURE RECEIVED NO GROWTH TO DATE CULTURE WILL BE HELD FOR 5 DAYS BEFORE ISSUING A FINAL NEGATIVE REPORT     Performed at [facility]   Report Status PENDING   Incomplete  CULTURE, BLOOD (ROUTINE X 2)     Status: None   Collection Time    02/09/14  6:45 PM      Result Value Ref Range Status   Specimen Description BLOOD RIGHT HAND   Final   Special Requests BOTTLES DRAWN AEROBIC AND ANAEROBIC 5CC EA   Final   Culture  Setup Time     Final   Value: 02/10/2014 00:49     Performed at [facility]   Culture     Final   Value:        BLOOD CULTURE RECEIVED NO GROWTH TO DATE CULTURE WILL BE HELD FOR 5 DAYS BEFORE ISSUING A FINAL NEGATIVE REPORT     Note: Culture results may be compromised due to an excessive volume of blood received in culture bottles.     Performed at [facility]   Report Status PENDING   Incomplete  CULTURE, ROUTINE-ABSCESS     Status: None   Collection Time    02/12/14  1:04 PM      Result Value Ref Range Status   Specimen Description ABSCESS ARM LEFT   Final   Special Requests PT ON ANCEF VANCO   Final   Gram Stain     Final   Value: ABUNDANT WBC PRESENT,BOTH PMN AND MONONUCLEAR     NO SQUAMOUS EPITHELIAL CELLS SEEN     FEW GRAM POSITIVE COCCI     IN PAIRS     Performed at [facility]    Culture     Final   Value: Culture reincubated for better growth     Performed at [facility]   Report Status PENDING   Incomplete  ANAEROBIC CULTURE     Status: None   Collection Time    02/12/14  1:04 PM      Result Value Ref Range Status   Specimen Description ABSCESS ARM LEFT   Final   Special Requests PT ON ANCEF VANCO   Final   Gram Stain     Final   Value: ABUNDANT WBC PRESENT,BOTH PMN AND MONONUCLEAR     NO SQUAMOUS EPITHELIAL CELLS SEEN     FEW GRAM POSITIVE COCCI     IN PAIRS     Performed at [facility]   Culture     Final   Value: NO ANAEROBES ISOLATED; CULTURE IN PROGRESS FOR 5 DAYS     Performed at [facility]   Report Status PENDING   Incomplete    Radiology Reports Dg Chest Port 1 View  02/08/2014   CLINICAL DATA:  Vascular access problem.  Generalized body aches.  EXAM: PORTABLE CHEST - 1 VIEW  COMPARISON:  None 10/2012  FINDINGS: The heart size and mediastinal contours are within normal limits. Both lungs are clear. Surgical clips near the gastroesophageal junction. The visualized skeletal structures are unremarkable.  IMPRESSION: No active disease.   Electronically Signed   By: Curlene Dolphin M.D.   On: 02/08/2014 07:52     CBC  Recent Labs Lab 02/08/14 0645 02/09/14 1453 02/10/14 0757 02/12/14 1600  WBC 11.0* 11.5* 7.2 2.5*  HGB 12.5* 11.1* 10.2* 10.1*  HCT 39.6 35.7* 31.8* 32.5*  PLT 102* 98* 100* 69*  MCV 101.8* 98.6 97.0 100.9*  MCH 32.1 30.7 31.1 31.4  MCHC 31.6 31.1 32.1 31.1  RDW 15.2 15.1 15.0 14.8  LYMPHSABS 0.8  --   --   --   MONOABS 0.1  --   --   --   EOSABS 0.1  --   --   --   BASOSABS 0.0  --   --   --     Chemistries   Recent Labs Lab 02/08/14 0645 02/09/14 1453 02/10/14 0758 02/11/14 2335 02/12/14 1600  NA 139 135* 138 137 139  K 4.2 5.6* 4.3 5.7* 4.1  CL 100 97 102 102 100  CO2 25 24 21 22 21   GLUCOSE 101* 211* 124* 154* 159*  BUN 45* 66* 67* 50* 57*  CREATININE 5.52* 7.50* 7.06* 5.82* 6.62*   CALCIUM 9.4 9.3 8.9 9.4 9.8  AST 18  --   --   --   --   ALT 16  --   --   --   --   ALKPHOS 56  --   --   --   --   BILITOT 0.6  --   --   --   --    ------------------------------------------------------------------------------------------------------------------ estimated creatinine clearance is 10.6 ml/min (by C-G formula based on Cr of 6.62). ------------------------------------------------------------------------------------------------------------------ No results found for this basename: HGBA1C,  in the last 72 hours ------------------------------------------------------------------------------------------------------------------ No results found for this basename: CHOL, HDL, LDLCALC, TRIG, CHOLHDL, LDLDIRECT,  in the last 72 hours ------------------------------------------------------------------------------------------------------------------ No results found for this basename: TSH, T4TOTAL, FREET3, T3FREE, THYROIDAB,  in the last 72 hours ------------------------------------------------------------------------------------------------------------------ No results found for this basename: VITAMINB12, FOLATE, FERRITIN, TIBC, IRON, RETICCTPCT,  in the last 72 hours  Coagulation profile No results found for this basename: INR, PROTIME,  in the last 168 hours  No results found for this basename: DDIMER,  in the last 72 hours  Cardiac Enzymes No results found for this basename: CK, CKMB,  TROPONINI, MYOGLOBIN,  in the last 168 hours ------------------------------------------------------------------------------------------------------------------ No components found with this basename: POCBNP,      Time Spent in minutes   15   Lowry Bala L M.D on 02/14/2014 at 6:20 AM Between 7am to 7pm - Pager - [phone]  After 7pm [email or web address] - password [facility]  [facility]

## 2014-02-14 NOTE — Progress Notes (Signed)
Patient Demographics  Adam Walsh, is a 73 y.o. male, DOB - 26-Jul-1940, ZMO:[id number]  Admit date - 02/08/2014   Admitting Physician Debbe Odea, MD  Outpatient Primary MD for the patient is Donnie Coffin, MD  LOS - 6   Chief Complaint  Patient presents with  . Vascular Access Problem  . Generalized Body Aches        Subjective:   C/o low back spasm. Gets it at home.    Assessment & Plan    1. MSSA sepsis secondary to infected AVF. TEE negative. Dr. Bridgett Larsson debrided area: 20 cc pus. 3 weeks ancef. Per [facility] keep in house until able to see some healing of wound  Pancytopenia: Wbc improved, but platelet count down. Might have to change abx. Will d/w ID  2. ESRD. Dialyzes 4 times a week at home, per renal  3. Myasthenia gravis and rheumatoid arthritis. Stable. 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   5. History of gluten sensitivity. On gluten free diet.  6. SVT during dialysis 9 4015. On acute EKG, echo shows EF of 55% with grade 1 chronic diastolic CHF, stable TSH, cardiology consulted, currently placed on beta blocker. Per RN, refuses tele and threw the box across the room. Decrease toprol dose due to borderline bp and bradycardia  Back spasm: heat, flexeril  Code Status: DO NOT RESUSCITATE  Family Communication: wife at bedside  Disposition Plan:    Procedures  TEE  1. Right internal jugular vein tunneled dialysis catheter placement  2. Right internal jugular vein cannulation under ultrasound guidance  3. Left arm fistulogram  4. Incision and drainage of left arm abscess   Consults  Renal, Vas Surgery, Cards, ID   Medications  Scheduled Meds: . aspirin  81 mg Oral Daily  . calcium acetate  1,334 mg Oral 5 X Daily  .   ceFAZolin (ANCEF) IV  2 g Intravenous Q M,W,F-HD  . darbepoetin (ARANESP) injection - DIALYSIS  60 mcg Intravenous Q Wed-HD  . folic acid  1 mg Oral Daily  . gabapentin  300 mg Oral QHS  . metoprolol succinate  12.5 mg Oral BID  . multivitamin  1 tablet Oral QHS  . pantoprazole  40 mg Oral Daily  . predniSONE  15 mg Oral Q breakfast  . sodium chloride  3 mL Intravenous Q12H  Continuous Infusions:   PRN Meds:.sodium chloride, sodium chloride, sodium chloride, acetaminophen, alum & mag hydroxide-simeth, cyclobenzaprine, HYDROcodone-acetaminophen, Influenza vac split quadrivalent PF, ondansetron (ZOFRAN) IV, sodium chloride  DVT Prophylaxis  SCD ( H/O possible HIT)  Lab Results  Component Value Date   PLT 56* 02/14/2014    Antibiotics     Anti-infectives   Start     Dose/Rate Route Frequency Ordered Stop   02/10/14 1400  ceFAZolin (ANCEF) IVPB 2 g/50 mL premix     2 g 100 mL/hr over 30 Minutes Intravenous Every M-W-F (Hemodialysis) 02/10/14 1353     02/10/14 1200  vancomycin (VANCOCIN) IVPB 1000 mg/200 mL premix  Status:  Discontinued     1,000 mg 200 mL/hr over 60 Minutes Intravenous Every M-W-F (Hemodialysis) 02/09/14 1229 02/11/14 0951   02/09/14 1600  vancomycin (VANCOCIN) IVPB 750 mg/150 ml premix     750 mg 150 mL/hr over 60 Minutes Intravenous  Once 02/09/14 1459 02/09/14 1813   02/09/14 1500  ceFAZolin (ANCEF) IVPB 2 g/50 mL premix     2 g 100 mL/hr over 30 Minutes Intravenous  Once 02/09/14 1434 02/09/14 1626   02/08/14 0645  vancomycin (VANCOCIN) IVPB 1000 mg/200 mL premix     1,000 mg 200 mL/hr over 60 Minutes Intravenous  Once 02/08/14 0644 02/08/14 0851   02/08/14 0645  piperacillin-tazobactam (ZOSYN) IVPB 2.25 g     2.25 g 100 mL/hr over 30 Minutes Intravenous  Once 02/08/14 0644 02/08/14 0932          Objective:   Filed Vitals:   02/14/14 1030 02/14/14 1100 02/14/14 1130 02/14/14 1145  BP: 100/57 99/71 105/61 95/54  Pulse: 53 49 56 56  Temp:    98.5  F (36.9 C)  TempSrc:    Oral  Resp:    18  Height:      Weight:    79.2 kg (174 lb 9.7 oz)  SpO2:    98%    Wt Readings from Last 3 Encounters:  02/14/14 79.2 kg (174 lb 9.7 oz)  02/14/14 79.2 kg (174 lb 9.7 oz)  02/14/14 79.2 kg (174 lb 9.7 oz)     Intake/Output Summary (Last 24 hours) at 02/14/14 1248 Last data filed at 02/14/14 1145  Gross per 24 hour  Intake    360 ml  Output   2000 ml  Net  -1640 ml     Physical Exam  Awake Alert, uncomfortable lying flat Symmetrical CTAB without WRR Regular slow +ve B.Sounds, Abd Soft, No tenderness, No organomegaly appriciated, No rebound  No Cyanosis, Clubbing or edema, No new Rash or bruise. Left arm with dressing CDI  Data Review   Micro Results Recent Results (from the past 240 hour(s))  CULTURE, BLOOD (ROUTINE X 2)     Status: None   Collection Time    02/08/14  6:29 AM      Result Value Ref Range Status   Specimen Description BLOOD RIGHT ARM   Final   Special Requests BOTTLES DRAWN AEROBIC AND ANAEROBIC 5CC   Final   Culture  Setup Time     Final   Value: 02/08/2014 15:16     Performed at [facility]   Culture     Final   Value: STAPHYLOCOCCUS AUREUS     Note: SUSCEPTIBILITIES PERFORMED ON PREVIOUS CULTURE WITHIN THE LAST 5 DAYS.     Note: Gram Stain Report Called to,Read Back By and Verified With: Sima Matas RN on 02/09/14 at 02:00 by  Rise Mu     Performed at [facility]   Report Status 02/11/2014 FINAL   Final  CULTURE, BLOOD (ROUTINE X 2)     Status: None   Collection Time    02/08/14  6:45 AM      Result Value Ref Range Status   Specimen Description BLOOD RIGHT ARM   Final   Special Requests BOTTLES DRAWN AEROBIC AND ANAEROBIC [facility]   Final   Culture  Setup Time     Final   Value: 02/08/2014 15:18     Performed at [facility]   Culture     Final   Value: STAPHYLOCOCCUS AUREUS     Note: RIFAMPIN AND GENTAMICIN SHOULD NOT BE USED AS SINGLE DRUGS FOR TREATMENT OF STAPH  INFECTIONS.     Note: Gram Stain Report Called to,Read Back By and Verified With: Sima Matas RN on 02/09/14 at 02:00 by Rise Mu     Performed at [facility]   Report Status 02/11/2014 FINAL   Final   Organism ID, Bacteria STAPHYLOCOCCUS AUREUS   Final  CULTURE, BLOOD (ROUTINE X 2)     Status: None   Collection Time    02/09/14  6:30 PM      Result Value Ref Range Status   Specimen Description BLOOD RIGHT ARM   Final   Special Requests BOTTLES DRAWN AEROBIC AND ANAEROBIC 5CC EA   Final   Culture  Setup Time     Final   Value: 02/10/2014 00:49     Performed at [facility]   Culture     Final   Value:        BLOOD CULTURE RECEIVED NO GROWTH TO DATE CULTURE WILL BE HELD FOR 5 DAYS BEFORE ISSUING A FINAL NEGATIVE REPORT     Performed at [facility]   Report Status PENDING   Incomplete  CULTURE, BLOOD (ROUTINE X 2)     Status: None   Collection Time    02/09/14  6:45 PM      Result Value Ref Range Status   Specimen Description BLOOD RIGHT HAND   Final   Special Requests BOTTLES DRAWN AEROBIC AND ANAEROBIC 5CC EA   Final   Culture  Setup Time     Final   Value: 02/10/2014 00:49     Performed at [facility]   Culture     Final   Value:        BLOOD CULTURE RECEIVED NO GROWTH TO DATE CULTURE WILL BE HELD FOR 5 DAYS BEFORE ISSUING A FINAL NEGATIVE REPORT     Note: Culture results may be compromised due to an excessive volume of blood received in culture bottles.     Performed at [facility]   Report Status PENDING   Incomplete  CULTURE, ROUTINE-ABSCESS     Status: None   Collection Time    02/12/14  1:04 PM      Result Value Ref Range Status   Specimen Description ABSCESS ARM LEFT   Final   Special Requests PT ON ANCEF VANCO   Final   Gram Stain     Final   Value: ABUNDANT WBC PRESENT,BOTH PMN AND MONONUCLEAR     NO SQUAMOUS EPITHELIAL CELLS SEEN     FEW GRAM POSITIVE COCCI     IN PAIRS     Performed at [facility]    Culture     Final   Value: FEW STAPHYLOCOCCUS AUREUS  Note: RIFAMPIN AND GENTAMICIN SHOULD NOT BE USED AS SINGLE DRUGS FOR TREATMENT OF STAPH INFECTIONS.     Performed at [facility]   Report Status PENDING   Incomplete  ANAEROBIC CULTURE     Status: None   Collection Time    02/12/14  1:04 PM      Result Value Ref Range Status   Specimen Description ABSCESS ARM LEFT   Final   Special Requests PT ON ANCEF VANCO   Final   Gram Stain     Final   Value: ABUNDANT WBC PRESENT,BOTH PMN AND MONONUCLEAR     NO SQUAMOUS EPITHELIAL CELLS SEEN     FEW GRAM POSITIVE COCCI     IN PAIRS     Performed at [facility]   Culture     Final   Value: NO ANAEROBES ISOLATED; CULTURE IN PROGRESS FOR 5 DAYS     Performed at [facility]   Report Status PENDING   Incomplete    Radiology Reports Dg Chest Port 1 View  02/08/2014   CLINICAL DATA:  Vascular access problem.  Generalized body aches.  EXAM: PORTABLE CHEST - 1 VIEW  COMPARISON:  None 10/2012  FINDINGS: The heart size and mediastinal contours are within normal limits. Both lungs are clear. Surgical clips near the gastroesophageal junction. The visualized skeletal structures are unremarkable.  IMPRESSION: No active disease.   Electronically Signed   By: Curlene Dolphin M.D.   On: 02/08/2014 07:52     CBC  Recent Labs Lab 02/08/14 0645 02/09/14 1453 02/10/14 0757 02/12/14 1600 02/14/14 0720  WBC 11.0* 11.5* 7.2 2.5* 5.4  HGB 12.5* 11.1* 10.2* 10.1* 10.5*  HCT 39.6 35.7* 31.8* 32.5* 33.1*  PLT 102* 98* 100* 69* 56*  MCV 101.8* 98.6 97.0 100.9* 96.5  MCH 32.1 30.7 31.1 31.4 30.6  MCHC 31.6 31.1 32.1 31.1 31.7  RDW 15.2 15.1 15.0 14.8 14.6  LYMPHSABS 0.8  --   --   --  1.5  MONOABS 0.1  --   --   --  0.0*  EOSABS 0.1  --   --   --  0.1  BASOSABS 0.0  --   --   --  0.0    Chemistries   Recent Labs Lab 02/08/14 0645 02/09/14 1453 02/10/14 0758 02/11/14 2335 02/12/14 1600 02/14/14 0728  NA 139 135* 138  137 139 136*  K 4.2 5.6* 4.3 5.7* 4.1 4.6  CL 100 97 102 102 100 98  CO2 25 24 21 22 21 24   GLUCOSE 101* 211* 124* 154* 159* 155*  BUN 45* 66* 67* 50* 57* 50*  CREATININE 5.52* 7.50* 7.06* 5.82* 6.62* 6.33*  CALCIUM 9.4 9.3 8.9 9.4 9.8 9.9  AST 18  --   --   --   --   --   ALT 16  --   --   --   --   --   ALKPHOS 56  --   --   --   --   --   BILITOT 0.6  --   --   --   --   --    ------------------------------------------------------------------------------------------------------------------ estimated creatinine clearance is 11.1 ml/min (by C-G formula based on Cr of 6.33). ------------------------------------------------------------------------------------------------------------------ No results found for this basename: HGBA1C,  in the last 72 hours ------------------------------------------------------------------------------------------------------------------ No results found for this basename: CHOL, HDL, LDLCALC, TRIG, CHOLHDL, LDLDIRECT,  in the last 72 hours ------------------------------------------------------------------------------------------------------------------ No results found for this basename: TSH, T4TOTAL,  FREET3, T3FREE, THYROIDAB,  in the last 72 hours ------------------------------------------------------------------------------------------------------------------ No results found for this basename: VITAMINB12, FOLATE, FERRITIN, TIBC, IRON, RETICCTPCT,  in the last 72 hours  Coagulation profile No results found for this basename: INR, PROTIME,  in the last 168 hours  No results found for this basename: DDIMER,  in the last 72 hours  Cardiac Enzymes No results found for this basename: CK, CKMB, TROPONINI, MYOGLOBIN,  in the last 168 hours ------------------------------------------------------------------------------------------------------------------ No components found with this basename: POCBNP,      Time Spent in minutes   25   Filip Luten L  M.D on 02/14/2014 at 12:48 PM Between 7am to 7pm - Pager - [phone]  After 7pm [email or web address] - password [facility]  [facility]

## 2014-02-14 NOTE — Progress Notes (Signed)
Pt BP 95/54 and hr 56, has been in 40's and 50's during dialysis, paged Dr. Conley Canal pt to get metoprolol 25 mg 24 hr does she want to hold.

## 2014-02-14 NOTE — Progress Notes (Signed)
   Daily Progress Note  Assessment/Planning: POD #2 s/p RIJ TDC, L arm I&D, L arm fistulogram   Awaiting abscess C&S  Continue wet-to-dry dressing BID  Some venous bleeding is not unexpected due to presence of superficial veins overlying the abscess cavity  If this patient has a significant bleed, the fistula will need to be ligated.  Subjective  - 2 Days Post-Op  Pain greatly better in L arm  Objective Filed Vitals:   02/14/14 0735 02/14/14 0745 02/14/14 0750 02/14/14 0800  BP: 130/68 101/71 106/65 102/61  Pulse: 53 73 50 52  Temp: 97.8 F (36.6 C)     TempSrc: Oral     Resp: 18     Height:      Weight: 181 lb 3.5 oz (82.2 kg)     SpO2: 97%       Intake/Output Summary (Last 24 hours) at 02/14/14 0836 Last data filed at 02/13/14 1814  Gross per 24 hour  Intake    600 ml  Output      0 ml  Net    600 ml   VASC  L arm incision clean with venous ooze, +bruit, +thrill  Laboratory CBC    Component Value Date/Time   WBC 5.4 02/14/2014 0720   WBC 9.6 08/19/2013 1358   HGB 10.5* 02/14/2014 0720   HCT 33.1* 02/14/2014 0720   PLT 56* 02/14/2014 0720    BMET    Component Value Date/Time   NA 136* 02/14/2014 0728   NA 144 08/19/2013 1358   K 4.6 02/14/2014 0728   CL 98 02/14/2014 0728   CO2 24 02/14/2014 0728   GLUCOSE 155* 02/14/2014 0728   GLUCOSE 134* 08/19/2013 1358   BUN 50* 02/14/2014 0728   BUN 49* 08/19/2013 1358   CREATININE 6.33* 02/14/2014 0728   CALCIUM 9.9 02/14/2014 0728   GFRNONAA 8* 02/14/2014 0728   GFRAA 9* 02/14/2014 0728    Adele Barthel, MD [facility]: [phone] Pager: [phone]  02/14/2014, 8:36 AM

## 2014-02-15 LAB — CULTURE, ROUTINE-ABSCESS

## 2014-02-15 LAB — CBC
HEMATOCRIT: 31.7 % — AB (ref 39.0–52.0)
Hemoglobin: 10.1 g/dL — ABNORMAL LOW (ref 13.0–17.0)
MCH: 30.9 pg (ref 26.0–34.0)
MCHC: 31.9 g/dL (ref 30.0–36.0)
MCV: 96.9 fL (ref 78.0–100.0)
Platelets: 62 10*3/uL — ABNORMAL LOW (ref 150–400)
RBC: 3.27 MIL/uL — ABNORMAL LOW (ref 4.22–5.81)
RDW: 14.6 % (ref 11.5–15.5)
WBC: 5.3 10*3/uL (ref 4.0–10.5)

## 2014-02-15 NOTE — Progress Notes (Signed)
Patient ID: Adam Walsh, male   DOB: 13-Sep-1940, 73 y.o.   MRN: [id number] Comfortable. Excellent thrill of left upper arm AV fistula. Dressing removed. He does have a skin tear from tape near the area of incision and drainage. The actual wound looks quite good is sealed. No evidence of active infection or disruption of his AV fistula

## 2014-02-15 NOTE — Progress Notes (Signed)
Subjective: No current complaints, walking around  Objective: Vital signs in last 24 hours: Temp:  [97.9 F (36.6 C)-98.7 F (37.1 C)] 97.9 F (36.6 C) (09/19 0500) Pulse Rate:  [48-73] 51 (09/19 0500) Resp:  [18] 18 (09/19 0500) BP: (95-113)/(44-71) 113/57 mmHg (09/19 0500) SpO2:  [94 %-98 %] 96 % (09/19 0500) Weight:  [79.2 kg (174 lb 9.7 oz)] 79.2 kg (174 lb 9.7 oz) (09/18 2100) Weight change:   Intake/Output from previous day: 09/18 0701 - 09/19 0700 In: 360 [P.O.:360] Out: 2000  Intake/Output this shift:   Lab Results:  Recent Labs  02/12/14 1600 02/14/14 0720  WBC 2.5* 5.4  HGB 10.1* 10.5*  HCT 32.5* 33.1*  PLT 69* 56*   BMET:  Recent Labs  02/12/14 1600 02/14/14 0728  NA 139 136*  K 4.1 4.6  CL 100 98  CO2 21 24  GLUCOSE 159* 155*  BUN 57* 50*  CREATININE 6.62* 6.33*  CALCIUM 9.8 9.9  ALBUMIN 2.6* 2.8*   No results found for this basename: PTH,  in the last 72 hours Iron Studies: No results found for this basename: IRON, TIBC, TRANSFERRIN, FERRITIN,  in the last 72 hours  Studies/Results: No results found.  EXAM: General appearance:  Alert, in no apparent distress Resp:  CTA without rales, rhonchi, or wheezes Cardio:  RRR without murmur or rub GI:  + BS, soft and nontender Extremities:  No edema Access:  R IJ catheter (placed 9/16), AVF @ LUA with + bruit  Dialysis Orders:  Home HD using NxStage with PureFlow 4X Week, MonWedFriSat, CAR 172, Therapy Fluid 1.0 K 45 Lactate, Volume per Tx 25 (liters), Flow Fraction 45 %, BFR 400, EDW 84  Epogen- 11,000u q week  Venofer 300mg  once 9/15  Paricalcititol 2 mcg 3x week  Assessment/Plan: 1. Cellulitis - BCs 9/13 + for MSSA bacteremia, TEE 9/16 negative, now on Vancomycin per ID since BCs were + while on antibiotics; I&D of abscess @ LUA AVF 9/16 per Dr. Bridgett Larsson (culture with few Gr + cocci). 2. 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 3. HTN/Volume - BP 113/57, started Metoprolol; no signs  or symptoms of fluid overload.   4. Anemia - Hgb 10.5, Aranesp 60 mcg on Wed. 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 6. Nutrition - Alb 2.8, gluten-free diet, vitamin, supplements. 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  8. Myasthenia gravis - on Prednisone. 9. Arthritis - per primary. 10. SVT - during HD, EF 55% per echo, started Metoprolol. 11. Dispo - HD @ Home training via catheter until infection resolves.   LOS: 7 days   LYLES,CHARLES 02/15/2014,7:39 AM   I have seen and examined this patient and agree with plan as outlined by C. Lyles, PA-C.  Appreciate ID input and VVS follow up. Quentina Fronek A,MD 02/15/2014 1:43 PM

## 2014-02-15 NOTE — Progress Notes (Signed)
Patient Demographics  Adam Walsh, is a 73 y.o. male, DOB - 03-09-1941, JME:[id number]  Admit date - 02/08/2014   Admitting Physician Debbe Odea, MD  Outpatient Primary MD for the patient is Donnie Coffin, MD  LOS - 7   Chief Complaint  Patient presents with  . Vascular Access Problem  . Generalized Body Aches        Subjective:   Back pain better. Better with ambulation heating pad. Flexeril helped. Same as what he gets home.   Assessment & Plan    1. MSSA sepsis secondary to infected AVF. TEE negative. Dr. Bridgett Larsson debrided area: 20 cc pus. 3 weeks ancef. Per VVS keep in house until able to see some healing of wound  Thrombocytopenia: Discussed with ID. Antibiotics changed to vancomycin. Also notified by pharmacy that patient was getting dialysis catheter packed with heparin, so it is possible that the thrombocytopenia is related to the heparin rather than antibiotic. This has since been changed to sodium citrate. Monitor daily.  2. ESRD. Dialyzes 4 times a week at home, per renal  3. Myasthenia gravis and rheumatoid arthritis. Stable. 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   5. History of gluten sensitivity. On gluten free diet.  6. SVT during dialysis 9 4015. On acute EKG, echo shows EF of 55% with grade 1 chronic diastolic CHF, stable TSH, cardiology consulted, currently placed on beta blocker. Per RN, refuses tele and threw the box across the room. Toprol dose decreased to 2 borderline hypertension and mild bradycardia.  Back spasm: Doubt infection. Patient refuses MRI at this time. If back pain continues, will revisit tomorrow.    Code Status: DO NOT RESUSCITATE  Family Communication: wife at bedside 9/18  Disposition Plan: Home  when cleared by vascular.   Procedures  TEE  1. Right internal jugular vein tunneled dialysis catheter placement  2. Right internal jugular vein cannulation under ultrasound guidance  3. Left arm fistulogram  4. Incision and drainage of left arm abscess   Consults  Renal, Vas Surgery, Cards, ID   Medications  Scheduled Meds: . anticoagulant sodium citrate  5 mL Intravenous Once  . aspirin  81 mg Oral Daily  . calcium acetate  1,334 mg Oral 5 X Daily  . darbepoetin (ARANESP) injection - DIALYSIS  60 mcg Intravenous Q Wed-HD  . folic acid  1 mg Oral Daily  . gabapentin  300 mg Oral QHS  . metoprolol succinate  12.5 mg Oral BID  . multivitamin  1 tablet Oral QHS  . pantoprazole  40 mg Oral Daily  . predniSONE  15 mg Oral Q breakfast  . sodium chloride  3 mL Intravenous Q12H  . [START ON 02/17/2014] vancomycin  1,000 mg Intravenous Q M,W,F-HD   Continuous Infusions:   PRN Meds:.sodium chloride, sodium chloride, sodium chloride, acetaminophen, alum & mag hydroxide-simeth, cyclobenzaprine, HYDROcodone-acetaminophen, HYDROmorphone (DILAUDID) injection, Influenza vac split quadrivalent PF, ondansetron (ZOFRAN) IV, promethazine, sodium chloride  DVT Prophylaxis  SCD ( H/O possible HIT)  Lab Results  Component Value Date   PLT 56* 02/14/2014    Antibiotics     Anti-infectives   Start     Dose/Rate Route Frequency Ordered Stop   02/17/14 1200  vancomycin (VANCOCIN) IVPB 1000 mg/200 mL premix     1,000 mg 200 mL/hr over 60 Minutes Intravenous Every M-W-F (Hemodialysis) 02/14/14 1501     02/14/14 1600  vancomycin (VANCOCIN) 1,250 mg in sodium chloride 0.9 % 250 mL IVPB     1,250 mg 166.7 mL/hr over 90 Minutes Intravenous  Once 02/14/14 1459 02/14/14 1849   02/10/14 1400  ceFAZolin (ANCEF) IVPB 2 g/50 mL premix  Status:  Discontinued     2 g 100 mL/hr over 30 Minutes Intravenous Every M-W-F (Hemodialysis) 02/10/14 1353 02/14/14 1418   02/10/14 1200  vancomycin (VANCOCIN) IVPB  1000 mg/200 mL premix  Status:  Discontinued     1,000 mg 200 mL/hr over 60 Minutes Intravenous Every M-W-F (Hemodialysis) 02/09/14 1229 02/11/14 0951   02/09/14 1600  vancomycin (VANCOCIN) IVPB 750 mg/150 ml premix     750 mg 150 mL/hr over 60 Minutes Intravenous  Once 02/09/14 1459 02/09/14 1813   02/09/14 1500  ceFAZolin (ANCEF) IVPB 2 g/50 mL premix     2 g 100 mL/hr over 30 Minutes Intravenous  Once 02/09/14 1434 02/09/14 1626   02/08/14 0645  vancomycin (VANCOCIN) IVPB 1000 mg/200 mL premix     1,000 mg 200 mL/hr over 60 Minutes Intravenous  Once 02/08/14 0644 02/08/14 0851   02/08/14 0645  piperacillin-tazobactam (ZOSYN) IVPB 2.25 g     2.25 g 100 mL/hr over 30 Minutes Intravenous  Once 02/08/14 0644 02/08/14 0932          Objective:   Filed Vitals:   02/14/14 1145 02/14/14 1754 02/14/14 2100 02/15/14 0500  BP: 95/54 113/58 104/44 113/57  Pulse: 56 58 62 51  Temp: 98.5 F (36.9 C) 98.7 F (37.1 C) 98.3 F (36.8 C) 97.9 F (36.6 C)  TempSrc: Oral Oral Oral Oral  Resp: 18 18 18 18   Height:      Weight: 79.2 kg (174 lb 9.7 oz)  79.2 kg (174 lb 9.7 oz)   SpO2: 98% 98% 94% 96%    Wt Readings from Last 3 Encounters:  02/14/14 79.2 kg (174 lb 9.7 oz)  02/14/14 79.2 kg (174 lb 9.7 oz)  02/14/14 79.2 kg (174 lb 9.7 oz)     Intake/Output Summary (Last 24 hours) at 02/15/14 0916 Last data filed at 02/14/14 1754  Gross per 24 hour  Intake    360 ml  Output   2000 ml  Net  -1640 ml     Physical Exam  Awake Alert, oriented. Comfortable. Symmetrical CTAB without WRR Regular slow +ve B.Sounds, Abd Soft, No tenderness, No organomegaly appriciated, No rebound  No Cyanosis, Clubbing or edema,  No new Rash or bruise. Left arm with dressing CDI  Data Review   Micro Results Recent Results (from the past 240 hour(s))  CULTURE, BLOOD (ROUTINE X 2)     Status: None   Collection Time    02/08/14  6:29 AM      Result Value Ref Range Status   Specimen Description  BLOOD RIGHT ARM   Final   Special Requests BOTTLES DRAWN AEROBIC AND ANAEROBIC 5CC   Final   Culture  Setup Time     Final   Value: 02/08/2014 15:16     Performed at [facility]   Culture     Final   Value: STAPHYLOCOCCUS AUREUS     Note: SUSCEPTIBILITIES PERFORMED ON PREVIOUS CULTURE WITHIN THE LAST 5 DAYS.     Note: Gram Stain Report Called to,Read Back By and Verified With: Sima Matas RN on 02/09/14 at 02:00 by Rise Mu     Performed at [facility]   Report Status 02/11/2014 FINAL   Final  CULTURE, BLOOD (ROUTINE X 2)     Status: None   Collection Time    02/08/14  6:45 AM      Result Value Ref Range Status   Specimen Description BLOOD RIGHT ARM   Final   Special Requests BOTTLES DRAWN AEROBIC AND ANAEROBIC [facility]   Final   Culture  Setup Time     Final   Value: 02/08/2014 15:18     Performed at [facility]   Culture     Final   Value: STAPHYLOCOCCUS AUREUS     Note: RIFAMPIN AND GENTAMICIN SHOULD NOT BE USED AS SINGLE DRUGS FOR TREATMENT OF STAPH INFECTIONS.     Note: Gram Stain Report Called to,Read Back By and Verified With: Sima Matas RN on 02/09/14 at 02:00 by Rise Mu     Performed at [facility]   Report Status 02/11/2014 FINAL   Final   Organism ID, Bacteria STAPHYLOCOCCUS AUREUS   Final  CULTURE, BLOOD (ROUTINE X 2)     Status: None   Collection Time    02/09/14  6:30 PM      Result Value Ref Range Status   Specimen Description BLOOD RIGHT ARM   Final   Special Requests BOTTLES DRAWN AEROBIC AND ANAEROBIC 5CC EA   Final   Culture  Setup Time     Final   Value: 02/10/2014 00:49     Performed at [facility]   Culture     Final   Value:        BLOOD CULTURE RECEIVED NO GROWTH TO DATE CULTURE WILL BE HELD FOR 5 DAYS BEFORE ISSUING A FINAL NEGATIVE REPORT     Performed at [facility]   Report Status PENDING   Incomplete  CULTURE, BLOOD (ROUTINE X 2)     Status: None   Collection Time    02/09/14  6:45 PM        Result Value Ref Range Status   Specimen Description BLOOD RIGHT HAND   Final   Special Requests BOTTLES DRAWN AEROBIC AND ANAEROBIC 5CC EA   Final   Culture  Setup Time     Final   Value: 02/10/2014 00:49     Performed at [facility]   Culture     Final   Value:        BLOOD CULTURE RECEIVED NO GROWTH TO DATE CULTURE WILL BE HELD FOR 5 DAYS BEFORE ISSUING A  FINAL NEGATIVE REPORT     Note: Culture results may be compromised due to an excessive volume of blood received in culture bottles.     Performed at [facility]   Report Status PENDING   Incomplete  CULTURE, ROUTINE-ABSCESS     Status: None   Collection Time    02/12/14  1:04 PM      Result Value Ref Range Status   Specimen Description ABSCESS ARM LEFT   Final   Special Requests PT ON ANCEF VANCO   Final   Gram Stain     Final   Value: ABUNDANT WBC PRESENT,BOTH PMN AND MONONUCLEAR     NO SQUAMOUS EPITHELIAL CELLS SEEN     FEW GRAM POSITIVE COCCI     IN PAIRS     Performed at [facility]   Culture     Final   Value: FEW STAPHYLOCOCCUS AUREUS     Note: RIFAMPIN AND GENTAMICIN SHOULD NOT BE USED AS SINGLE DRUGS FOR TREATMENT OF STAPH INFECTIONS.     Performed at [facility]   Report Status PENDING   Incomplete  ANAEROBIC CULTURE     Status: None   Collection Time    02/12/14  1:04 PM      Result Value Ref Range Status   Specimen Description ABSCESS ARM LEFT   Final   Special Requests PT ON ANCEF VANCO   Final   Gram Stain     Final   Value: ABUNDANT WBC PRESENT,BOTH PMN AND MONONUCLEAR     NO SQUAMOUS EPITHELIAL CELLS SEEN     FEW GRAM POSITIVE COCCI     IN PAIRS     Performed at [facility]   Culture     Final   Value: NO ANAEROBES ISOLATED; CULTURE IN PROGRESS FOR 5 DAYS     Performed at [facility]   Report Status PENDING   Incomplete    Radiology Reports Dg Chest Port 1 View  02/08/2014   CLINICAL DATA:  Vascular access problem.  Generalized body aches.   EXAM: PORTABLE CHEST - 1 VIEW  COMPARISON:  None 10/2012  FINDINGS: The heart size and mediastinal contours are within normal limits. Both lungs are clear. Surgical clips near the gastroesophageal junction. The visualized skeletal structures are unremarkable.  IMPRESSION: No active disease.   Electronically Signed   By: Curlene Dolphin M.D.   On: 02/08/2014 07:52     CBC  Recent Labs Lab 02/09/14 1453 02/10/14 0757 02/12/14 1600 02/14/14 0720  WBC 11.5* 7.2 2.5* 5.4  HGB 11.1* 10.2* 10.1* 10.5*  HCT 35.7* 31.8* 32.5* 33.1*  PLT 98* 100* 69* 56*  MCV 98.6 97.0 100.9* 96.5  MCH 30.7 31.1 31.4 30.6  MCHC 31.1 32.1 31.1 31.7  RDW 15.1 15.0 14.8 14.6  LYMPHSABS  --   --   --  1.5  MONOABS  --   --   --  0.0*  EOSABS  --   --   --  0.1  BASOSABS  --   --   --  0.0    Chemistries   Recent Labs Lab 02/09/14 1453 02/10/14 0758 02/11/14 2335 02/12/14 1600 02/14/14 0728  NA 135* 138 137 139 136*  K 5.6* 4.3 5.7* 4.1 4.6  CL 97 102 102 100 98  CO2 24 21 22 21 24   GLUCOSE 211* 124* 154* 159* 155*  BUN 66* 67* 50* 57* 50*  CREATININE 7.50* 7.06* 5.82* 6.62* 6.33*  CALCIUM  9.3 8.9 9.4 9.8 9.9   ------------------------------------------------------------------------------------------------------------------ estimated creatinine clearance is 11.1 ml/min (by C-G formula based on Cr of 6.33). ------------------------------------------------------------------------------------------------------------------ No results found for this basename: HGBA1C,  in the last 72 hours ------------------------------------------------------------------------------------------------------------------ No results found for this basename: CHOL, HDL, LDLCALC, TRIG, CHOLHDL, LDLDIRECT,  in the last 72 hours ------------------------------------------------------------------------------------------------------------------ No results found for this basename: TSH, T4TOTAL, FREET3, T3FREE, THYROIDAB,  in the last  72 hours ------------------------------------------------------------------------------------------------------------------ No results found for this basename: VITAMINB12, FOLATE, FERRITIN, TIBC, IRON, RETICCTPCT,  in the last 72 hours  Coagulation profile No results found for this basename: INR, PROTIME,  in the last 168 hours  No results found for this basename: DDIMER,  in the last 72 hours  Cardiac Enzymes No results found for this basename: CK, CKMB, TROPONINI, MYOGLOBIN,  in the last 168 hours ------------------------------------------------------------------------------------------------------------------ No components found with this basename: POCBNP,      Time Spent in minutes   25   Saraia Platner L M.D on 02/15/2014 at 9:16 AM Between 7am to 7pm - Pager - [phone]  After 7pm [email or web address] - password [facility]  [facility]

## 2014-02-15 NOTE — Progress Notes (Signed)
Pt scheduled for dose of metoprolol. HR 56. Parameters per MD  to hold if <60. Medication not given. Will continue to monitor. Manya Silvas, RN

## 2014-02-16 DIAGNOSIS — A4101 Sepsis due to Methicillin susceptible Staphylococcus aureus: Secondary | ICD-10-CM | POA: Diagnosis not present

## 2014-02-16 LAB — CBC
HCT: 34.5 % — ABNORMAL LOW (ref 39.0–52.0)
Hemoglobin: 10.8 g/dL — ABNORMAL LOW (ref 13.0–17.0)
MCH: 30.7 pg (ref 26.0–34.0)
MCHC: 31.3 g/dL (ref 30.0–36.0)
MCV: 98 fL (ref 78.0–100.0)
PLATELETS: 88 10*3/uL — AB (ref 150–400)
RBC: 3.52 MIL/uL — ABNORMAL LOW (ref 4.22–5.81)
RDW: 14.9 % (ref 11.5–15.5)
WBC: 5.1 10*3/uL (ref 4.0–10.5)

## 2014-02-16 LAB — CULTURE, BLOOD (ROUTINE X 2)
CULTURE: NO GROWTH
Culture: NO GROWTH

## 2014-02-16 MED ORDER — DEXTROSE 5 % IV SOLN: 2.0000 g | INTRAVENOUS | Status: DC

## 2014-02-16 MED ORDER — ACETAMINOPHEN 325 MG PO TABS: 650.0000 mg | ORAL_TABLET | Freq: Four times a day (QID) | ORAL | Status: AC | PRN

## 2014-02-16 MED ORDER — METHOTREXATE 2.5 MG PO TABS: 10.0000 mg | ORAL_TABLET | ORAL | Status: DC

## 2014-02-16 MED ORDER — CYCLOBENZAPRINE HCL 5 MG PO TABS: 5.0000 mg | ORAL_TABLET | Freq: Three times a day (TID) | ORAL | Status: DC | PRN

## 2014-02-16 MED ORDER — METOPROLOL SUCCINATE 12.5 MG HALF TABLET: 12.5000 mg | ORAL_TABLET | Freq: Two times a day (BID) | ORAL | Status: DC

## 2014-02-16 MED ORDER — HYDROCODONE-ACETAMINOPHEN 5-325 MG PO TABS: 1.0000 | ORAL_TABLET | Freq: Four times a day (QID) | ORAL | Status: DC | PRN

## 2014-02-16 NOTE — Progress Notes (Signed)
Subjective:  Comfortable in recliner, no complaints  Objective: Vital signs in last 24 hours: Temp:  [97.9 F (36.6 C)-98.6 F (37 C)] 97.9 F (36.6 C) (09/20 0551) Pulse Rate:  [56-64] 56 (09/20 0551) Resp:  [18-20] 18 (09/20 0551) BP: (106-115)/(49-61) 109/49 mmHg (09/20 0551) SpO2:  [94 %-100 %] 100 % (09/20 0551) Weight:  [79.201 kg (174 lb 9.7 oz)] 79.201 kg (174 lb 9.7 oz) (09/19 2041) Weight change: -2.999 kg (-6 lb 9.8 oz)  Intake/Output from previous day: 09/19 0701 - 09/20 0700 In: 1503 [P.O.:1500; I.V.:3] Out: -  Intake/Output this shift:   Lab Results:  Recent Labs  02/15/14 1050 02/16/14 0634  WBC 5.3 5.1  HGB 10.1* 10.8*  HCT 31.7* 34.5*  PLT 62* 88*   BMET:  Recent Labs  02/14/14 0728  NA 136*  K 4.6  CL 98  CO2 24  GLUCOSE 155*  BUN 50*  CREATININE 6.33*  CALCIUM 9.9  ALBUMIN 2.8*   No results found for this basename: PTH,  in the last 72 hours Iron Studies: No results found for this basename: IRON, TIBC, TRANSFERRIN, FERRITIN,  in the last 72 hours  Studies/Results: No results found.  EXAM:  General appearance: Alert, in no apparent distress  Resp: CTA without rales, rhonchi, or wheezes  Cardio: RRR without murmur or rub  GI: + BS, soft and nontender  Extremities: No edema  Access: R IJ catheter (placed 9/16), AVF @ LUA with + bruit   Dialysis Orders:  Home HD using NxStage with PureFlow 4X Week, MonWedFriSat, CAR 172, Therapy Fluid 1.0 K 45 Lactate, Volume per Tx 25 (liters), Flow Fraction 45 %, BFR 400, EDW 84  Epogen- 11,000u q week  Venofer 300mg  once 9/15  Paricalcititol 2 mcg 3x week  Assessment/Plan: 1. Cellulitis - BCs 9/13 + for MSSA bacteremia, TEE 9/16 negative, now on Vancomycin per ID since BCs were + while on antibiotics; I&D of abscess @ LUA AVF 9/16 per Dr. Bridgett Larsson (culture with few Gr + cocci), currently healing well. 2. 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 3. HTN/Volume - BP 109/49, started  Metoprolol; no signs or symptoms of fluid overload.  4. Anemia - Hgb 10.8, Aranesp 60 mcg on Wed. 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 6. Nutrition - Alb 2.8, gluten-free diet, vitamin, supplements. 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  8. Myasthenia gravis - on Prednisone. 9. Arthritis - per primary. 10. SVT - during HD, EF 55% per echo, started Metoprolol. 11. Dispo - HD @ Home training via catheter until infection resolves.     LOS: 8 days   LYLES,CHARLES 02/16/2014,8:58 AM   I have seen and examined this patient and agree with plan as outlined by C. Lyles, PA-C.  Pt stable for discharge and will go to hometraining for abx and HD with TDC until we are able to cannulate his AVF from VVS. Emmry Hinsch A,MD 02/16/2014 11:37 AM

## 2014-02-16 NOTE — Progress Notes (Signed)
Spoke with Dr. Conley Canal, abtx plan will be to resume back to cefazolin 2gm after each HD for 28 days, using 9/17 day 1 for MSSA abscess and bacteremia. Thrombocytopenia thought to be due to heparin and not cefazolin. Will resume back to cefazolin instead of vancomycin. Have HD check CBcx 2 per week in teh first week, then weekly. rtc at [facility] in 3 wks.

## 2014-02-16 NOTE — Care Management Note (Signed)
    Page 1 of 2   02/16/2014     1:36:53 PM CARE MANAGEMENT NOTE 02/16/2014  Patient:  Adam Walsh, Adam Walsh   Account Number:  [id number]  Date Initiated:  02/10/2014  Documentation initiated by:  [facility]  Subjective/Objective Assessment:   ESRD, Sepsis with cellulitis     Action/Plan:   lives at home with wife, Adam Walsh   Anticipated DC Date:  02/14/2014   Anticipated DC Plan:  [place]  CM consult      [place]   Choice offered to / List presented to:  C-1 Patient        [place] arranged  HH-1 RN  [place].   Status of service:  Completed, signed off Medicare Important Message given?  YES (If response is "NO", the following Medicare IM given date fields will be blank) Date Medicare IM given:  02/11/2014 Medicare IM given by:  [facility] Date Additional Medicare IM given:  02/13/2014 Additional Medicare IM given by:  Jonnie Finner  Discharge Disposition:  [place]  Per UR Regulation:  Reviewed for med. necessity/level of care/duration of stay  If discussed at [place] of Stay Meetings, dates discussed:   02/13/2014    Comments:    02-16-14 [place], RN, BSN [phone] Medicare IM given to pt today at 1300 by CM. Referal received for [facility] RN for wound care. CM did speak to pt and wife in regards to [facility] agency and choice made for [facility]. CM did call [facility] for referral. SOC to begin within 24-48 hr post d/c.   02/13/2014 1400 NCM spoke to pt and gave permission to speak to wife, Adam Walsh. Wife states no DME or HH PT needed at this time. Waiting final recommendations for home for dressing changes at home. Wife states pt was receiving home dialysis and RN was coming to their home. Jonnie Finner RN CCM Case Mgmt phone [phone]

## 2014-02-16 NOTE — Progress Notes (Signed)
Patient Discharge:  Disposition: Discharge home with wife   Education: Pt educated on medications, follow up appointments, discharge instructions and  dressing changes  IV: discontinued   Telemetry: N/A  Follow-up appointments: Wife will set up for Pt.   Prescriptions: Scripts given to pt  Transportation: transport home by wife vis car  Belongings:Glasses, dentures, all other belongings take with pt

## 2014-02-16 NOTE — Discharge Summary (Signed)
Physician Discharge Summary  Adam Walsh HEN:[id number] DOB: [birth year] DOA: 02/08/2014  PCP: Donnie Coffin, MD  Admit date: 02/08/2014 Discharge date: 02/16/2014  Time spent: 28 MINUTES  Recommendations for Outpatient Follow-up:  1. Check CBC twice next week, then weekly while on ancef 2. No heparin with dialysis or in catheter 3. Home health arranged for dressing changes  Discharge Diagnoses:  Principal Problem:  methicillin sensitive Staphylococcus aureus bacteremia Active Problems:   Cellulitis abscess of arm, left near AVF, MSSA   End stage renal disease on dialysis   HIT (heparin-induced thrombocytopenia)   Hypertension   Celiac sprue   Rheumatoid arthritis   Sepsis   Myasthenia exacerbation   Macrocytic anemia   Back spasm pSVT  Discharge Condition: stable  Filed Weights   02/14/14 1145 02/14/14 2100 02/15/14 2041  Weight: 79.2 kg (174 lb 9.7 oz) 79.2 kg (174 lb 9.7 oz) 79.201 kg (174 lb 9.7 oz)    History of present illness:  73 y.o. male with ESRD, myasthenia gravis, rheumatoid arthritis, HIT. The patient underwent dialysis yesterday which was uneventful and later in the evening noticed redness around his fistula. By this morning he was having fevers and generalized weakness along with muscle aches and joint pains.he decided to come to the ER where it was noted that his fistula appeared to be infected. He was evaluated with an ultrasound and is negative for an abscess.   Hospital Course:  Admitted to hospitalists, vascular surgery consulted. Blood cultures positive for MSSA. ID consulted.  Patient eventually taken to OR for I and D and 20 cc pus drained from near AVF   MSSA sepsis secondary to infected AVF. TEE negative. Dr. Bridgett Larsson debrided area: 20 cc pus. ID recommends 4 weeks ancef from 02/13/14 (stop date 03/12/14). Repeat cultures negative to date. Will get 2 gm ancef after each dialysis. ID will arrange f/u later this week. Cleared by Dr. Donnetta Hutching for discharge.  Patient is to call Dr. Lianne Moris office tomorrow to arrange f/u. Home health arranged for woundcare.  Right IJ tunneled dialysis catheter placed for dialysis while AVF infection being treated   Thrombocytopenia: patient developed thrombocytopenia during hospitalization. Has h/o HIT. After some inquiry, realized dialysis had instilled heparin into HD cath. This has since been changed to sodium citrate, and platelet count climbing.   ESRD. Dialyzes 4 times a week at home usually, will go to center 3 times weekly while on antibiotics   Myasthenia gravis and rheumatoid arthritis. Stable. MTX held in the setting of active infection   SVT during dialysis 9 4015. On acute EKG, echo shows EF of 55% with grade 1 chronic diastolic CHF, stable TSH, cardiology consulted, currently placed beta blocker.   Code Status: DO NOT RESUSCITATE   Procedures  TEE  1. Right internal jugular vein tunneled dialysis catheter placement  2. Right internal jugular vein cannulation under ultrasound guidance  3. Left arm fistulogram  4. Incision and drainage of left arm abscess   Consults Renal, Vas Surgery, Cards, ID   Discharge Exam: Filed Vitals:   02/16/14 0920  BP: 102/57  Pulse: 55  Temp: 97.8 F (36.6 C)  Resp: 16    General: comfortable Cardiovascular: CTA Respiratory: RRR Ext no CCE. Dressing CDI   Discharge Instructions   Discharge instructions    Complete by:  As directed   RENAL DIET. HOLD METHOTREXATE UNTIL INFECTION COMPLETELY TREATED.     Discharge wound care:    Complete by:  As directed   WET TO DRY  DRESSING WITH SALINE SOAKED 2X2 COVERED WITH KERLEX BID     Increase activity slowly    Complete by:  As directed           Current Discharge Medication List    START taking these medications   Details  acetaminophen (TYLENOL) 325 MG tablet Take 2 tablets (650 mg total) by mouth every 6 (six) hours as needed for mild pain (or Fever >/= 101).    ceFAZolin 2 g in dextrose 5 % 50 mL  ivpb Inject 2 g into the vein every Monday, Wednesday, and Friday. AFTER DIALYSIS THOUGH 03/12/14 THEN STOP.    cyclobenzaprine (FLEXERIL) 5 MG tablet Take 1 tablet (5 mg total) by mouth 3 (three) times daily as needed for muscle spasms. Qty: 20 tablet, Refills: 0    HYDROcodone-acetaminophen (NORCO/VICODIN) 5-325 MG per tablet Take 1 tablet by mouth every 6 (six) hours as needed for moderate pain. Qty: 20 tablet, Refills: 0    metoprolol succinate (TOPROL-XL) 12.5 mg TB24 24 hr tablet Take 0.5 tablets (12.5 mg total) by mouth 2 (two) times daily. Qty: 60 tablet, Refills: 0      CONTINUE these medications which have CHANGED   Details  methotrexate (RHEUMATREX) 2.5 MG tablet Take 4 tablets (10 mg total) by mouth once a week. HOLD UNTIL INFECTION CLEARED Qty: 4 tablet, Refills: 0      CONTINUE these medications which have NOT CHANGED   Details  aspirin 81 MG tablet Take 81 mg by mouth daily.     B Complex-C-Folic Acid (DIALYVITE 267 PO) Take 1 tablet by mouth daily.    calcium acetate (PHOSLO) 667 MG capsule Take 1,334 mg by mouth 5 (five) times daily. With each meal and snack.    clonazePAM (KLONOPIN) 0.5 MG tablet Take 0.5 mg by mouth daily as needed.    EPOETIN ALFA IJ Inject 11,000 Units as directed once a week.     folic acid (FOLVITE) 1 MG tablet Take 1 mg by mouth daily.    gabapentin (NEURONTIN) 300 MG capsule Take 300 mg by mouth at bedtime as needed. For feet/nerve pain    omeprazole (PRILOSEC) 20 MG capsule Take 20 mg by mouth daily.    paricalcitol (ZEMPLAR) 5 MCG/ML injection Inject 2 mcg into the vein every Monday, Wednesday, and Friday. In home dialysis    predniSONE (DELTASONE) 5 MG tablet Take 3 tablets (15 mg total) by mouth daily with breakfast. Qty: 90 tablet, Refills: 5       Allergies  Allergen Reactions  . Avelox [Moxifloxacin Hcl In Nacl]     Pt quit breathing  . Gluten   . Gluten Meal Anaphylaxis  . Ciprofloxacin Other (See Comments)     myasthenia  . Erythromycin Other (See Comments)    myasthenia  . Erythromycin Base Other (See Comments)    Myasthenia gravis  . Gemifloxacin Other (See Comments)    myasthenia  . Gentamicin Other (See Comments)    myasthenia  . Heparin Other (See Comments)    myasthenia  . Kanamycin Other (See Comments)    myasthenia  . Levofloxacin Other (See Comments)    myasthenia  . 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 Neomycin Other (See Comments)    myasthenia  . Norfloxacin Other (See Comments)    myasthenia  . Quinolones Other (See Comments)    Myasthenia gravis  . Streptomycin Other (See Comments)    myasthenia  .  Tobramycin Other (See Comments)    myasthenia      The results of significant diagnostics from this hospitalization (including imaging, microbiology, ancillary and laboratory) are listed below for reference.    Significant Diagnostic Studies: Ct Humerus Left W Contrast  02/10/2014   CLINICAL DATA:  Hemodialysis patient with draining wound at left upper arm fistula. Evaluate for soft tissue abscess.  EXAM: CT OF THE LEFT HUMERUS WITH CONTRAST  TECHNIQUE: Multidetector CT imaging was performed following the standard protocol during bolus administration of intravenous contrast.  CONTRAST:  34mL OMNIPAQUE IOHEXOL 300 MG/ML  SOLN  COMPARISON:  None.  FINDINGS: Metallic BBs were placed around the area of concern anteriorly in the mid upper arm. There is mild subcutaneous edema in this area, but no focal fluid collection. The hemodialysis graft is well opacified with contrast. Its proximal anastomosis appears normal. The distal anastomosis is not well visualized due to artifact, but also appears grossly normal.  There is no evidence of acute fracture, dislocation or bone destruction. There is no elbow joint effusion. Mild glenohumeral degenerative changes are noted. The visualized left chest wall and left lung appear  unremarkable.  IMPRESSION: 1. No evidence of soft tissue abscess in the left upper arm. 2. There is mild nonspecific subcutaneous edema in the area of concern consistent with cellulitis. 3. The hemodialysis graft appears unremarkable.   Electronically Signed   By: Camie Patience M.D.   On: 02/10/2014 16:16   Dg Chest Port 1 View  02/12/2014   CLINICAL DATA:  Central catheter placement  EXAM: PORTABLE CHEST - 1 VIEW  COMPARISON:  February 08, 2014  FINDINGS: Central catheter tip is at the cavoatrial junction. No pneumothorax. There is underlying emphysematous change. There is mild scarring in the left base. There is no edema or consolidation. Heart is upper normal in size with pulmonary vascularity within normal limits. No adenopathy.  IMPRESSION: Central catheter tip at cavoatrial junction. No pneumothorax. No edema or consolidation. Underlying emphysema. Scarring left base.   Electronically Signed   By: Lowella Grip M.D.   On: 02/12/2014 15:05   Dg Chest Port 1 View  02/08/2014   CLINICAL DATA:  Vascular access problem.  Generalized body aches.  EXAM: PORTABLE CHEST - 1 VIEW  COMPARISON:  None 10/2012  FINDINGS: The heart size and mediastinal contours are within normal limits. Both lungs are clear. Surgical clips near the gastroesophageal junction. The visualized skeletal structures are unremarkable.  IMPRESSION: No active disease.   Electronically Signed   By: Curlene Dolphin M.D.   On: 02/08/2014 07:52    Microbiology: Recent Results (from the past 240 hour(s))  CULTURE, BLOOD (ROUTINE X 2)     Status: None   Collection Time    02/08/14  6:29 AM      Result Value Ref Range Status   Specimen Description BLOOD RIGHT ARM   Final   Special Requests BOTTLES DRAWN AEROBIC AND ANAEROBIC 5CC   Final   Culture  Setup Time     Final   Value: 02/08/2014 15:16     Performed at [facility]   Culture     Final   Value: STAPHYLOCOCCUS AUREUS     Note: SUSCEPTIBILITIES PERFORMED ON PREVIOUS  CULTURE WITHIN THE LAST 5 DAYS.     Note: Gram Stain Report Called to,Read Back By and Verified With: Sima Matas RN on 02/09/14 at 02:00 by Rise Mu     Performed at [facility]   Report Status  02/11/2014 FINAL   Final  CULTURE, BLOOD (ROUTINE X 2)     Status: None   Collection Time    02/08/14  6:45 AM      Result Value Ref Range Status   Specimen Description BLOOD RIGHT ARM   Final   Special Requests BOTTLES DRAWN AEROBIC AND ANAEROBIC [facility]   Final   Culture  Setup Time     Final   Value: 02/08/2014 15:18     Performed at [facility]   Culture     Final   Value: STAPHYLOCOCCUS AUREUS     Note: RIFAMPIN AND GENTAMICIN SHOULD NOT BE USED AS SINGLE DRUGS FOR TREATMENT OF STAPH INFECTIONS.     Note: Gram Stain Report Called to,Read Back By and Verified With: Sima Matas RN on 02/09/14 at 02:00 by Rise Mu     Performed at [facility]   Report Status 02/11/2014 FINAL   Final   Organism ID, Bacteria STAPHYLOCOCCUS AUREUS   Final  CULTURE, BLOOD (ROUTINE X 2)     Status: None   Collection Time    02/09/14  6:30 PM      Result Value Ref Range Status   Specimen Description BLOOD RIGHT ARM   Final   Special Requests BOTTLES DRAWN AEROBIC AND ANAEROBIC 5CC EA   Final   Culture  Setup Time     Final   Value: 02/10/2014 00:49     Performed at [facility]   Culture     Final   Value:        BLOOD CULTURE RECEIVED NO GROWTH TO DATE CULTURE WILL BE HELD FOR 5 DAYS BEFORE ISSUING A FINAL NEGATIVE REPORT     Performed at [facility]   Report Status PENDING   Incomplete  CULTURE, BLOOD (ROUTINE X 2)     Status: None   Collection Time    02/09/14  6:45 PM      Result Value Ref Range Status   Specimen Description BLOOD RIGHT HAND   Final   Special Requests BOTTLES DRAWN AEROBIC AND ANAEROBIC 5CC EA   Final   Culture  Setup Time     Final   Value: 02/10/2014 00:49     Performed at [facility]   Culture     Final   Value:         BLOOD CULTURE RECEIVED NO GROWTH TO DATE CULTURE WILL BE HELD FOR 5 DAYS BEFORE ISSUING A FINAL NEGATIVE REPORT     Note: Culture results may be compromised due to an excessive volume of blood received in culture bottles.     Performed at [facility]   Report Status PENDING   Incomplete  CULTURE, ROUTINE-ABSCESS     Status: None   Collection Time    02/12/14  1:04 PM      Result Value Ref Range Status   Specimen Description ABSCESS ARM LEFT   Final   Special Requests PT ON ANCEF VANCO   Final   Gram Stain     Final   Value: ABUNDANT WBC PRESENT,BOTH PMN AND MONONUCLEAR     NO SQUAMOUS EPITHELIAL CELLS SEEN     FEW GRAM POSITIVE COCCI     IN PAIRS     Performed at [facility]   Culture     Final   Value: FEW STAPHYLOCOCCUS AUREUS     Note: RIFAMPIN AND GENTAMICIN SHOULD NOT BE USED AS SINGLE DRUGS FOR TREATMENT  OF STAPH INFECTIONS.     Performed at [facility]   Report Status 02/15/2014 FINAL   Final   Organism ID, Bacteria STAPHYLOCOCCUS AUREUS   Final  ANAEROBIC CULTURE     Status: None   Collection Time    02/12/14  1:04 PM      Result Value Ref Range Status   Specimen Description ABSCESS ARM LEFT   Final   Special Requests PT ON ANCEF VANCO   Final   Gram Stain     Final   Value: ABUNDANT WBC PRESENT,BOTH PMN AND MONONUCLEAR     NO SQUAMOUS EPITHELIAL CELLS SEEN     FEW GRAM POSITIVE COCCI     IN PAIRS     Performed at [facility]   Culture     Final   Value: NO ANAEROBES ISOLATED; CULTURE IN PROGRESS FOR 5 DAYS     Performed at [facility]   Report Status PENDING   Incomplete     Labs: Basic Metabolic Panel:  Recent Labs Lab 02/09/14 1453 02/10/14 0758 02/11/14 2335 02/12/14 1600 02/14/14 0728  NA 135* 138 137 139 136*  K 5.6* 4.3 5.7* 4.1 4.6  CL 97 102 102 100 98  CO2 24 21 22 21 24   GLUCOSE 211* 124* 154* 159* 155*  BUN 66* 67* 50* 57* 50*  CREATININE 7.50* 7.06* 5.82* 6.62* 6.33*  CALCIUM 9.3 8.9 9.4 9.8 9.9   PHOS  --  5.9*  --  5.5* 4.5   Liver Function Tests:  Recent Labs Lab 02/10/14 0758 02/12/14 1600 02/14/14 0728  ALBUMIN 2.2* 2.6* 2.8*   No results found for this basename: LIPASE, AMYLASE,  in the last 168 hours No results found for this basename: AMMONIA,  in the last 168 hours CBC:  Recent Labs Lab 02/10/14 0757 02/12/14 1600 02/14/14 0720 02/15/14 1050 02/16/14 0634  WBC 7.2 2.5* 5.4 5.3 5.1  NEUTROABS  --   --  3.8  --   --   HGB 10.2* 10.1* 10.5* 10.1* 10.8*  HCT 31.8* 32.5* 33.1* 31.7* 34.5*  MCV 97.0 100.9* 96.5 96.9 98.0  PLT 100* 69* 56* 62* 88*   Cardiac Enzymes: No results found for this basename: CKTOTAL, CKMB, CKMBINDEX, TROPONINI,  in the last 168 hours BNP: BNP (last 3 results) No results found for this basename: PROBNP,  in the last 8760 hours CBG: No results found for this basename: GLUCAP,  in the last 168 hours     Signed:  Jonny Dearden L  [facility] 02/16/2014, 12:15 PM

## 2014-02-17 LAB — ANAEROBIC CULTURE

## 2014-02-19 ENCOUNTER — Telehealth: Payer: Self-pay | Admitting: Vascular Surgery

## 2014-02-19 NOTE — Telephone Encounter (Signed)
Spoke with pts wife to schedule, dpm °

## 2014-02-19 NOTE — Telephone Encounter (Signed)
Message copied by Gena Fray on Wed Feb 19, 2014  2:13 PM ------      Message from: Denman George      Created: Tue Feb 18, 2014  1:25 PM      Regarding: RE: Follow Up?       Dr. Bridgett Larsson did an I & D left arm abscess on 02/12/14.  Discharge note on 9/20 stated to call Dr. Lianne Moris office for f/u.  I think he should f/u 02/28/14; hosp. f/u s/p I & D left arm abscess, and Fistulogram of 9/16.             ----- Message -----         From: Gena Fray         Sent: 02/18/2014  11:02 AM           To: Lynetta Mare Pullins, RN      Subject: Follow Up?                                               Arbie Cookey,            I received a call from Cecilio Asper, he recently had a fistulogram and wanted to know when to follow up. I did not receive any staff messages on him, and normally we do not follow up with fistulograms. Could you let me know if he requires any follow up?            Thanks,       Hinton Dyer       ------

## 2014-02-20 ENCOUNTER — Ambulatory Visit (HOSPITAL_COMMUNITY): Admission: RE | Admit: 2014-02-20 | Payer: Medicare Other | Source: Ambulatory Visit

## 2014-02-25 ENCOUNTER — Other Ambulatory Visit: Payer: Self-pay | Admitting: Rheumatology

## 2014-02-25 DIAGNOSIS — G8929 Other chronic pain: Secondary | ICD-10-CM

## 2014-02-25 DIAGNOSIS — M533 Sacrococcygeal disorders, not elsewhere classified: Principal | ICD-10-CM

## 2014-02-26 ENCOUNTER — Ambulatory Visit
Admission: RE | Admit: 2014-02-26 | Discharge: 2014-02-26 | Disposition: A | Discharge status: Home / Self Care | Payer: Medicare Other | Source: Ambulatory Visit | Attending: Rheumatology | Admitting: Rheumatology

## 2014-02-26 DIAGNOSIS — M533 Sacrococcygeal disorders, not elsewhere classified: Principal | ICD-10-CM

## 2014-02-26 DIAGNOSIS — G8929 Other chronic pain: Secondary | ICD-10-CM

## 2014-02-27 ENCOUNTER — Encounter: Payer: Self-pay | Admitting: Vascular Surgery

## 2014-02-28 ENCOUNTER — Ambulatory Visit (INDEPENDENT_AMBULATORY_CARE_PROVIDER_SITE_OTHER): Payer: Medicare Other | Admitting: Vascular Surgery

## 2014-02-28 ENCOUNTER — Encounter: Payer: Self-pay | Admitting: Vascular Surgery

## 2014-02-28 VITALS — BP 131/66 | HR 78 | Ht 70.0 in | Wt 187.4 lb

## 2014-02-28 DIAGNOSIS — N186 End stage renal disease: Secondary | ICD-10-CM

## 2014-02-28 NOTE — Progress Notes (Signed)
    Postoperative Access Visit   History of Present Illness  Adam Walsh is a 73 y.o. year old male who presents for postoperative follow-up for: RIJ TDC, L arm I&D  (Date: 02/12/14).  The patient's wounds are healed.  The patient notes no steal symptoms.  The patient is able to complete their activities of daily living.  The patient's current symptoms are: none.  For VQI Use Only  PRE-ADM LIVING: Home  AMB STATUS: Ambulatory  Physical Examination Filed Vitals:   02/28/14 1051  BP: 131/66  Pulse: 78    LUE: Incision is healed, skin feels warm, hand grip is 5/5, sensation in digits is intact, palpable thrill, bruit can  be auscultated   Medical Decision Making  Adam Walsh is a 73 y.o. year old male who presents s/p RIJ TDC , L arm I&D for abscess at button hole site.  I would let the left arm heal for another 2 weeks before starting to use the L BVT again.  The patient's tunneled dialysis catheter can be removed after two successful cannulations and completed dialysis treatments.  Thank you for allowing [place] to participate in this patient's care.  Adele Barthel, MD [facility] Office: [phone] Pager: [phone]  02/28/2014, 11:38 AM

## 2014-03-11 ENCOUNTER — Encounter: Payer: Self-pay | Admitting: Internal Medicine

## 2014-03-11 ENCOUNTER — Other Ambulatory Visit: Payer: Medicare Other | Admitting: Internal Medicine

## 2014-03-11 NOTE — Progress Notes (Signed)
   Subjective:    Patient ID: Adam Walsh, male    DOB: 1940-10-05, 73 y.o.   MRN: [id number]  HPI    Review of Systems     Objective:   Physical Exam        Assessment & Plan:  Repeat blood culture at the end of treatment for complicated staph aureus infection

## 2014-03-24 ENCOUNTER — Telehealth: Payer: Self-pay | Admitting: Neurology

## 2014-03-24 NOTE — Telephone Encounter (Signed)
Spoke to patient and he relayed that his hands are shaking more and his legs are getting weaker.  He prefers to speak to Dr. Jannifer Franklin and will wait for his return tomorrow.  I will wait for advice from doctor before scheduling appointment.

## 2014-03-24 NOTE — Telephone Encounter (Signed)
I called the patient. He reports a generalized weakness and tremor that has worsened since he was seen last. I will get a RV within the next several weeks.

## 2014-03-24 NOTE — Telephone Encounter (Signed)
Patient's spouse calling and stated patient is getting weaker.  Patient takes 15 mg Prednisone once a day.  Questioning if he needs to be seen before 12/8.  Please call anytime and may detailed message on voicemail.  Call spouses cell if not able to reach on home #.  Cell [phone].

## 2014-03-25 ENCOUNTER — Telehealth: Payer: Self-pay | Admitting: Neurology

## 2014-03-25 ENCOUNTER — Encounter: Payer: Self-pay | Admitting: Neurology

## 2014-03-25 ENCOUNTER — Ambulatory Visit
Admission: RE | Admit: 2014-03-25 | Discharge: 2014-03-25 | Disposition: A | Discharge status: Home / Self Care | Payer: Medicare Other | Source: Ambulatory Visit | Attending: Neurology | Admitting: Neurology

## 2014-03-25 ENCOUNTER — Ambulatory Visit (INDEPENDENT_AMBULATORY_CARE_PROVIDER_SITE_OTHER): Payer: Medicare Other | Admitting: Neurology

## 2014-03-25 VITALS — BP 128/76 | HR 77 | Ht 70.0 in | Wt 183.0 lb

## 2014-03-25 DIAGNOSIS — G7001 Myasthenia gravis with (acute) exacerbation: Secondary | ICD-10-CM

## 2014-03-25 DIAGNOSIS — M25512 Pain in left shoulder: Secondary | ICD-10-CM

## 2014-03-25 MED ORDER — PREDNISONE 20 MG PO TABS: 20.0000 mg | ORAL_TABLET | Freq: Every day | ORAL | Status: DC

## 2014-03-25 NOTE — Patient Instructions (Signed)
Myasthenia Gravis Myasthenia gravis is a disease that causes muscle weakness throughout the body. The muscles affected are the ones we can control (voluntary muscles). An example of a voluntary muscle is your hand muscles. You can control the muscles to make the hand pick something up. An example of an involuntary muscle is the heart. The heart beats without any direction from you.  Myasthenia Gravis is thought to be an autoimmune disease. That means that normal defenses of the body begin to attack the body. In this case, the immune system begins to attack cells located at the junctions of the muscles and the nerves. Women are affected more often. Women are affected at a younger age than men. Babies born to affected women frequently develop symptoms at an early age. SYMPTOMS Initially in the disease, the facial muscles are affected first. After this, a person may develop droopy eyelids. They may have difficulty controlling facial muscles. They may have problems chewing. Swallowing and speaking may become impaired. The weakness gradually spreads to the arms and legs. It begins to affect breathing. Sometimes, the symptoms lessen or go away without any apparent cause. DIAGNOSIS  Diagnosis can be made with blood tests. Tests such as electromyography may be done to examine the electrical activity in the muscle. An improvement in symptoms after having an anti-cholinesterase drug helps confirm the diagnosis.  TREATMENT  Medicines are usually prescribed as the first treatment. These medicines help, but they do not cure the disease. A plasma cleansing procedure (plasmapheresis) can be used to treat a crisis. It can also be used to prepare a person for surgery. This procedure produces short-term improvement. Some cases are helped by removing the thymus gland. Steroids are used for short-term benefits. Document Released: 08/22/2000 Document Revised: 08/08/2011 Document Reviewed: 07/17/2013 [employer] Patient  Information 2015 [employer]. This information is not intended to replace advice given to you by your health care provider. Make sure you discuss any questions you have with your health care provider.  

## 2014-03-25 NOTE — Telephone Encounter (Signed)
I called the patient. The X ray of the left shoulder appears unremarkable. The patient could have a rotator cuff tear. We will look for improvement in the strength of the shoulders on both sides over the next several weeks.

## 2014-03-25 NOTE — Telephone Encounter (Signed)
Spoke to patient coming in today at 1340 for appointment.

## 2014-03-25 NOTE — Progress Notes (Signed)
Reason for visit: Myasthenia gravis  Adam Walsh is an 73 y.o. male  History of present illness:  Adam Walsh is a 73 year old right-handed white male with a history of myasthenia gravis. The patient has not been able to tolerate the CellCept secondary to a drop in hemoglobin, and he could not tolerate Mestinon. The patient was recently in the hospital with a staph infection involving the left arm in September 2015. The patient was treated with antibiotics, and he just recently was restarted on methotrexate, currently taking 10 mg once a week. The patient believes that this medication has helped his neck discomfort. The patient believes that he has had a gradually progressive decline in his strength of the arms and legs over the last several months. The patient is on 15 mg of prednisone. He has noted a fatigue tremor in the arms associated with the weakness. He is having increasing weakness in the legs, but he reports no recent falls. He comes to this office for an evaluation. He denies problems with double vision, ptosis, difficulty with chewing or swallowing.  Past Medical History  Diagnosis Date  . Hemodialysis patient     Monday, Wednesday and Friday  . HIT (heparin-induced thrombocytopenia)   . Myasthenia gravis   . Hypertension   . History of pyelonephritis   . History of nephrolithiasis   . Celiac sprue   . Hearing deficit     left hearing aid  . Neuropathy     Peripheral neuropathy  . Pancreatic cyst     Past Surgical History  Procedure Laterality Date  . Exploratory laparotomy    . Transurethral resection of prostate    . Cholecystectomy    . Av fistula placement, radiocephalic  16/10/96    Left  . Basilic vein transposition  10/06/10    2nd stage Left arm  . Hematoma evacuation  11/23/10    Left Arm   . Cardiac catheterization  04/2012    nl cors, no LV gram; EF 65% on nuc stress  . Tee without cardioversion N/A 02/12/2014    Procedure: TRANSESOPHAGEAL  ECHOCARDIOGRAM (TEE);  Surgeon: Pixie Casino, MD;  Location: [place];  Service: Cardiovascular;  Laterality: N/A;  . Fistulogram Left 02/12/2014    Procedure: FISTULOGRAM;  Surgeon: Conrad Salem, MD;  Location: [place];  Service: Vascular;  Laterality: Left;  . Insertion of dialysis catheter Right 02/12/2014    Procedure: INSERTION OF DIALYSIS CATHETER;  Surgeon: Conrad , MD;  Location: [place];  Service: Vascular;  Laterality: Right;  . Incision and drainage abscess Left 02/12/2014    Procedure: INCISION AND DRAINAGE ABSCESS;  Surgeon: Conrad , MD;  Location: [place];  Service: Vascular;  Laterality: Left;    Family History  Problem Relation Age of Onset  . Stroke Mother   . Diabetes Mother   . Heart disease Brother   . Dementia Sister     Social history:  reports that he quit smoking about 44 years ago. His smoking use included Cigarettes. He smoked 0.00 packs per day. He has never used smokeless tobacco. He reports that he does not drink alcohol or use illicit drugs.    Allergies  Allergen Reactions  . Avelox [Moxifloxacin Hcl In Nacl]     Pt quit breathing  . Gluten   . Gluten Meal Anaphylaxis  . Ciprofloxacin Other (See Comments)    myasthenia  . Erythromycin Other (See Comments)    myasthenia  . Erythromycin Base  Other (See Comments)    Myasthenia gravis  . Gemifloxacin Other (See Comments)    myasthenia  . Gentamicin Other (See Comments)    myasthenia  . Heparin Other (See Comments)    myasthenia  . Kanamycin Other (See Comments)    myasthenia  . Levofloxacin Other (See Comments)    myasthenia  . 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 Neomycin Other (See Comments)    myasthenia  . Norfloxacin Other (See Comments)    myasthenia  . Quinolones Other (See Comments)    Myasthenia gravis  . Streptomycin Other (See Comments)    myasthenia  . Tobramycin Other (See Comments)    myasthenia     Medications:  Current Outpatient Prescriptions on File Prior to Visit  Medication Sig Dispense Refill  . acetaminophen (TYLENOL) 325 MG tablet Take 2 tablets (650 mg total) by mouth every 6 (six) hours as needed for mild pain (or Fever >/= 101).      Marland Kitchen aspirin 81 MG tablet Take 162 mg by mouth daily.       . B Complex-C-Folic Acid (DIALYVITE 433 PO) Take 1 tablet by mouth daily.      . calcium acetate (PHOSLO) 667 MG capsule Take 1,334 mg by mouth 5 (five) times daily. With each meal and snack.      Marland Kitchen ceFAZolin 2 g in dextrose 5 % 50 mL ivpb Inject 2 g into the vein every Monday, Wednesday, and Friday. AFTER DIALYSIS THOUGH 03/12/14 THEN STOP.      Marland Kitchen clonazePAM (KLONOPIN) 0.5 MG tablet Take 0.5 mg by mouth daily as needed.      . cyclobenzaprine (FLEXERIL) 5 MG tablet Take 1 tablet (5 mg total) by mouth 3 (three) times daily as needed for muscle spasms.  20 tablet  0  . EPOETIN ALFA IJ Inject 11,000 Units as directed once a week.       . folic acid (FOLVITE) 1 MG tablet Take 1 mg by mouth daily.      Marland Kitchen gabapentin (NEURONTIN) 300 MG capsule Take 300 mg by mouth at bedtime as needed. For feet/nerve pain      . HYDROcodone-acetaminophen (NORCO) 10-325 MG per tablet       . methotrexate (RHEUMATREX) 2.5 MG tablet Take 4 tablets (10 mg total) by mouth once a week. HOLD UNTIL INFECTION CLEARED  4 tablet  0  . metoprolol succinate (TOPROL-XL) 12.5 mg TB24 24 hr tablet Take 0.5 tablets (12.5 mg total) by mouth 2 (two) times daily.  60 tablet  0  . metroNIDAZOLE (FLAGYL) 250 MG tablet       . omeprazole (PRILOSEC) 20 MG capsule Take 20 mg by mouth daily.      . paricalcitol (ZEMPLAR) 5 MCG/ML injection Inject 2 mcg into the vein every Monday, Wednesday, and Friday. In home dialysis       No current facility-administered medications on file prior to visit.    ROS:  Out of a complete 14 system review of symptoms, the patient complains only of the following symptoms, and all other reviewed systems  are negative.  Fatigue Ringing in the ears Blurred vision Cold intolerance Restless legs Food allergies Joint pain, back pain, aching muscles, muscle cramps, walking difficulties, neck pain, neck stiffness Bruising easily Dizziness, weakness Anxiety   Blood pressure 128/76, pulse 77, height 5\' 10"  (1.778 m), weight 183 lb (83.008 kg).  Physical Exam  General: The patient is alert  and cooperative at the time of the examination.  Skin: No significant peripheral edema is noted.   Neurologic Exam  Mental status: The patient is oriented x 3.  Cranial nerves: Facial symmetry is present. Speech is normal, no aphasia or dysarthria is noted. Extraocular movements are full. Visual fields are full.With superior gaze for 1 minute, no ptosis or.pertinence of days is seen, no subjective double vision was noted.  Motor: The patient has good strength in all 4 extremities, With the exception that there is 4/5 weakness proximally in the legs, and the deltoid muscle on the right. The patient has 3/5 strength on the left shoulder, but he does have pain with abduction. The patient is unable to rise from a seated position with arms crossed. With the arm on the right abducted for 1 minute, fatigue of the deltoid muscle is seen.  Sensory examination: Soft touch sensation is symmetric on the face, arms, and legs.  Coordination: The patient has good finger-nose-finger and heel-to-shin bilaterally.  Gait and station: The patient has a normal gait. Tandem gait is unsteady.  Romberg is negative. No drift is seen.  Reflexes: Deep tendon reflexes are symmetric.   Assessment/Plan:  1. Myasthenia gravis  2. Seronegative arthritis   3. End-stage renal disease  The patient has gotten weaker with the proximal muscles of the legs and arms. He is having pain with abduction of the left arm, but both arms are weaker, and both legs are weaker proximally. The patient has recently been treated for  diverticulitis and a staph infection. The patient is now back on methotrexate which could potentially help the myasthenia. The patient will be increased on the prednisone taking 20 mg daily. The patient will be sent for an x-ray of the left shoulder given the pain involved with abduction. The patient may be at risk for avascular necrosis of the left shoulder. The patient will follow-up in December 2015. The patient continues to worsen, he may be sent for IVIG or plasmapheresis therapy.   Jill Alexanders MD 03/25/2014 8:03 PM  [facility] [address] [place], [place] [address]  Phone [phone] Fax [phone]

## 2014-03-31 ENCOUNTER — Telehealth: Payer: Self-pay | Admitting: Neurology

## 2014-03-31 MED ORDER — CLOTRIMAZOLE 10 MG MT TROC: 10.0000 mg | Freq: Every day | OROMUCOSAL | Status: AC

## 2014-03-31 NOTE — Telephone Encounter (Signed)
Patient's wife is calling regarding patient. Patient was given Prednisone last Tuesday and now patient has a very sore mouth. Can a Rx for Clotrimazole 10mg  lozenges be called in to soothe his mouth. [facility]. Thank you.

## 2014-03-31 NOTE — Telephone Encounter (Signed)
I called patient. 2 days ago, he started getting sore in the mouth, has a white coating on the tongue and mouth. He likely has oral thrush, went up from 15-20 mg daily of prednisone. I will call in clotrimazole for the next 2 weeks.

## 2014-04-02 ENCOUNTER — Telehealth: Payer: Self-pay | Admitting: Neurology

## 2014-04-02 NOTE — Telephone Encounter (Signed)
Patient's spouse stated patient is experiencing swollen face and mouth very sore after taking clotrimazole (MYCELEX) 10 MG troche.  Please call home until 10:15 this am, after 10:15 call cell [phone].

## 2014-04-02 NOTE — Telephone Encounter (Signed)
I called the patient. I talk with the wife. The patient believes that he is getting worse instead of better with the clotrimazole. I have recommended they see the primary care physician to ensure that the patient actually has an oral thrush issue, and to determine whether or not he is allergic to clotrimazole.

## 2014-04-04 ENCOUNTER — Inpatient Hospital Stay (HOSPITAL_COMMUNITY): Payer: Medicare Other

## 2014-04-04 ENCOUNTER — Emergency Department (HOSPITAL_COMMUNITY): Payer: Medicare Other

## 2014-04-04 ENCOUNTER — Encounter (HOSPITAL_COMMUNITY): Payer: Self-pay | Admitting: Emergency Medicine

## 2014-04-04 ENCOUNTER — Inpatient Hospital Stay (HOSPITAL_COMMUNITY)
Admission: EM | Admit: 2014-04-04 | Discharge: 2014-04-10 | DRG: 056 | Disposition: A | Discharge status: Hospice | Payer: Medicare Other | Attending: Internal Medicine | Admitting: Internal Medicine

## 2014-04-04 DIAGNOSIS — I471 Supraventricular tachycardia, unspecified: Secondary | ICD-10-CM | POA: Diagnosis present

## 2014-04-04 DIAGNOSIS — M549 Dorsalgia, unspecified: Secondary | ICD-10-CM | POA: Diagnosis present

## 2014-04-04 DIAGNOSIS — R531 Weakness: Secondary | ICD-10-CM | POA: Diagnosis not present

## 2014-04-04 DIAGNOSIS — R29898 Other symptoms and signs involving the musculoskeletal system: Secondary | ICD-10-CM | POA: Diagnosis present

## 2014-04-04 DIAGNOSIS — G7000 Myasthenia gravis without (acute) exacerbation: Secondary | ICD-10-CM | POA: Diagnosis present

## 2014-04-04 DIAGNOSIS — A047 Enterocolitis due to Clostridium difficile: Secondary | ICD-10-CM | POA: Diagnosis present

## 2014-04-04 DIAGNOSIS — A0472 Enterocolitis due to Clostridium difficile, not specified as recurrent: Secondary | ICD-10-CM | POA: Diagnosis present

## 2014-04-04 DIAGNOSIS — Z833 Family history of diabetes mellitus: Secondary | ICD-10-CM

## 2014-04-04 DIAGNOSIS — I120 Hypertensive chronic kidney disease with stage 5 chronic kidney disease or end stage renal disease: Secondary | ICD-10-CM | POA: Diagnosis present

## 2014-04-04 DIAGNOSIS — G629 Polyneuropathy, unspecified: Secondary | ICD-10-CM | POA: Diagnosis present

## 2014-04-04 DIAGNOSIS — Z823 Family history of stroke: Secondary | ICD-10-CM

## 2014-04-04 DIAGNOSIS — N186 End stage renal disease: Secondary | ICD-10-CM | POA: Diagnosis present

## 2014-04-04 DIAGNOSIS — Z515 Encounter for palliative care: Secondary | ICD-10-CM

## 2014-04-04 DIAGNOSIS — N390 Urinary tract infection, site not specified: Secondary | ICD-10-CM | POA: Diagnosis present

## 2014-04-04 DIAGNOSIS — B370 Candidal stomatitis: Secondary | ICD-10-CM | POA: Diagnosis not present

## 2014-04-04 DIAGNOSIS — Z8249 Family history of ischemic heart disease and other diseases of the circulatory system: Secondary | ICD-10-CM

## 2014-04-04 DIAGNOSIS — I10 Essential (primary) hypertension: Secondary | ICD-10-CM | POA: Diagnosis present

## 2014-04-04 DIAGNOSIS — Z87891 Personal history of nicotine dependence: Secondary | ICD-10-CM

## 2014-04-04 DIAGNOSIS — R109 Unspecified abdominal pain: Secondary | ICD-10-CM | POA: Insufficient documentation

## 2014-04-04 DIAGNOSIS — D61818 Other pancytopenia: Secondary | ICD-10-CM | POA: Diagnosis present

## 2014-04-04 DIAGNOSIS — R197 Diarrhea, unspecified: Secondary | ICD-10-CM | POA: Diagnosis present

## 2014-04-04 DIAGNOSIS — G7001 Myasthenia gravis with (acute) exacerbation: Secondary | ICD-10-CM | POA: Diagnosis not present

## 2014-04-04 DIAGNOSIS — G8929 Other chronic pain: Secondary | ICD-10-CM | POA: Diagnosis present

## 2014-04-04 DIAGNOSIS — Z992 Dependence on renal dialysis: Secondary | ICD-10-CM

## 2014-04-04 DIAGNOSIS — K900 Celiac disease: Secondary | ICD-10-CM | POA: Diagnosis present

## 2014-04-04 DIAGNOSIS — N2581 Secondary hyperparathyroidism of renal origin: Secondary | ICD-10-CM | POA: Diagnosis present

## 2014-04-04 HISTORY — DX: Diverticulitis of intestine, part unspecified, without perforation or abscess without bleeding: K57.92

## 2014-04-04 LAB — CBC WITH DIFFERENTIAL/PLATELET
BASOS PCT: 1 % (ref 0–1)
Basophils Absolute: 0 10*3/uL (ref 0.0–0.1)
EOS PCT: 11 % — AB (ref 0–5)
Eosinophils Absolute: 0.1 10*3/uL (ref 0.0–0.7)
HEMATOCRIT: 30.9 % — AB (ref 39.0–52.0)
HEMOGLOBIN: 10.3 g/dL — AB (ref 13.0–17.0)
LYMPHS PCT: 66 % — AB (ref 12–46)
Lymphs Abs: 0.9 10*3/uL (ref 0.7–4.0)
MCH: 31.7 pg (ref 26.0–34.0)
MCHC: 33.3 g/dL (ref 30.0–36.0)
MCV: 95.1 fL (ref 78.0–100.0)
MONOS PCT: 2 % — AB (ref 3–12)
Monocytes Absolute: 0 10*3/uL — ABNORMAL LOW (ref 0.1–1.0)
Neutro Abs: 0.3 10*3/uL — ABNORMAL LOW (ref 1.7–7.7)
Neutrophils Relative %: 20 % — ABNORMAL LOW (ref 43–77)
Platelets: 32 10*3/uL — ABNORMAL LOW (ref 150–400)
RBC: 3.25 MIL/uL — ABNORMAL LOW (ref 4.22–5.81)
RDW: 16.4 % — ABNORMAL HIGH (ref 11.5–15.5)
WBC: 1.3 10*3/uL — CL (ref 4.0–10.5)

## 2014-04-04 LAB — COMPREHENSIVE METABOLIC PANEL
ALT: 36 U/L (ref 0–53)
ANION GAP: 16 — AB (ref 5–15)
AST: 31 U/L (ref 0–37)
Albumin: 2.7 g/dL — ABNORMAL LOW (ref 3.5–5.2)
Alkaline Phosphatase: 57 U/L (ref 39–117)
BUN: 40 mg/dL — AB (ref 6–23)
CALCIUM: 8.9 mg/dL (ref 8.4–10.5)
CO2: 22 meq/L (ref 19–32)
CREATININE: 5.4 mg/dL — AB (ref 0.50–1.35)
Chloride: 98 mEq/L (ref 96–112)
GFR, EST AFRICAN AMERICAN: 11 mL/min — AB (ref >90)
GFR, EST NON AFRICAN AMERICAN: 10 mL/min — AB (ref >90)
GLUCOSE: 135 mg/dL — AB (ref 70–99)
Potassium: 4.5 mEq/L (ref 3.7–5.3)
Sodium: 136 mEq/L — ABNORMAL LOW (ref 137–147)
Total Bilirubin: 0.9 mg/dL (ref 0.3–1.2)
Total Protein: 5.3 g/dL — ABNORMAL LOW (ref 6.0–8.3)

## 2014-04-04 LAB — URINALYSIS, ROUTINE W REFLEX MICROSCOPIC
Bilirubin Urine: NEGATIVE
GLUCOSE, UA: 500 mg/dL — AB
KETONES UR: NEGATIVE mg/dL
NITRITE: NEGATIVE
PROTEIN: 30 mg/dL — AB
Specific Gravity, Urine: 1.015 (ref 1.005–1.030)
Urobilinogen, UA: 0.2 mg/dL (ref 0.0–1.0)
pH: 8 (ref 5.0–8.0)

## 2014-04-04 LAB — URINE MICROSCOPIC-ADD ON

## 2014-04-04 LAB — DIFFERENTIAL
BASOS ABS: 0 10*3/uL (ref 0.0–0.1)
Basophils Relative: 0 % (ref 0–1)
Eosinophils Absolute: 0.1 10*3/uL (ref 0.0–0.7)
Eosinophils Relative: 7 % — ABNORMAL HIGH (ref 0–5)
LYMPHS PCT: 33 % (ref 12–46)
Lymphs Abs: 0.3 10*3/uL — ABNORMAL LOW (ref 0.7–4.0)
Monocytes Absolute: 0 10*3/uL — ABNORMAL LOW (ref 0.1–1.0)
Monocytes Relative: 0 % — ABNORMAL LOW (ref 3–12)
NEUTROS ABS: 0.6 10*3/uL — AB (ref 1.7–7.7)
Neutrophils Relative %: 60 % (ref 43–77)

## 2014-04-04 LAB — CBC
HEMATOCRIT: 31.4 % — AB (ref 39.0–52.0)
Hemoglobin: 10.5 g/dL — ABNORMAL LOW (ref 13.0–17.0)
MCH: 31.8 pg (ref 26.0–34.0)
MCHC: 33.4 g/dL (ref 30.0–36.0)
MCV: 95.2 fL (ref 78.0–100.0)
Platelets: 35 10*3/uL — ABNORMAL LOW (ref 150–400)
RBC: 3.3 MIL/uL — ABNORMAL LOW (ref 4.22–5.81)
RDW: 16.4 % — AB (ref 11.5–15.5)
WBC: 1.1 10*3/uL — CL (ref 4.0–10.5)

## 2014-04-04 LAB — I-STAT TROPONIN, ED: Troponin i, poc: 0.01 ng/mL (ref 0.00–0.08)

## 2014-04-04 LAB — TSH: TSH: 3.22 u[IU]/mL (ref 0.350–4.500)

## 2014-04-04 MED ORDER — IOHEXOL 300 MG/ML  SOLN
25.0000 mL | INTRAMUSCULAR | Status: AC
  Administered 2014-04-05 (×2): 25 mL via ORAL

## 2014-04-04 MED ORDER — FOLIC ACID 1 MG PO TABS
1.0000 mg | ORAL_TABLET | Freq: Every day | ORAL | Status: DC
  Administered 2014-04-05 – 2014-04-09 (×5): 1 mg via ORAL
  Filled 2014-04-04 (×6): qty 1

## 2014-04-04 MED ORDER — SODIUM CHLORIDE 0.9 % IJ SOLN: 3.0000 mL | INTRAMUSCULAR | Status: DC | PRN

## 2014-04-04 MED ORDER — CALCIUM ACETATE 667 MG PO CAPS
1334.0000 mg | ORAL_CAPSULE | Freq: Two times a day (BID) | ORAL | Status: DC | PRN
  Filled 2014-04-04: qty 2

## 2014-04-04 MED ORDER — SODIUM CHLORIDE 0.9 % IJ SOLN
3.0000 mL | Freq: Two times a day (BID) | INTRAMUSCULAR | Status: DC
  Administered 2014-04-09: 3 mL via INTRAVENOUS

## 2014-04-04 MED ORDER — IOHEXOL 300 MG/ML  SOLN: 25.0000 mL | INTRAMUSCULAR | Status: AC

## 2014-04-04 MED ORDER — RENA-VITE PO TABS
1.0000 | ORAL_TABLET | Freq: Every day | ORAL | Status: DC
  Administered 2014-04-05 – 2014-04-09 (×5): 1 via ORAL
  Filled 2014-04-04 (×6): qty 1

## 2014-04-04 MED ORDER — DEXTROSE 5 % IV SOLN
1.0000 g | INTRAVENOUS | Status: DC
  Administered 2014-04-04: 1 g via INTRAVENOUS
  Filled 2014-04-04 (×2): qty 10

## 2014-04-04 MED ORDER — PREDNISONE 20 MG PO TABS
20.0000 mg | ORAL_TABLET | Freq: Every day | ORAL | Status: DC
  Administered 2014-04-05 – 2014-04-09 (×5): 20 mg via ORAL
  Filled 2014-04-04 (×6): qty 1

## 2014-04-04 MED ORDER — METOPROLOL SUCCINATE 12.5 MG HALF TABLET
12.5000 mg | ORAL_TABLET | Freq: Two times a day (BID) | ORAL | Status: DC
  Administered 2014-04-05: 12.5 mg via ORAL
  Filled 2014-04-04 (×4): qty 1

## 2014-04-04 MED ORDER — ACETAMINOPHEN 325 MG PO TABS: 650.0000 mg | ORAL_TABLET | Freq: Four times a day (QID) | ORAL | Status: DC | PRN

## 2014-04-04 MED ORDER — CALCIUM ACETATE 667 MG PO CAPS
1334.0000 mg | ORAL_CAPSULE | Freq: Three times a day (TID) | ORAL | Status: DC
  Administered 2014-04-05 – 2014-04-09 (×10): 1334 mg via ORAL
  Filled 2014-04-04 (×16): qty 2

## 2014-04-04 MED ORDER — SODIUM CHLORIDE 0.9 % IV SOLN
250.0000 mL | INTRAVENOUS | Status: DC | PRN
  Administered 2014-04-08 (×2): 250 mL via INTRAVENOUS

## 2014-04-04 MED ORDER — PANTOPRAZOLE SODIUM 40 MG PO TBEC
40.0000 mg | DELAYED_RELEASE_TABLET | Freq: Every day | ORAL | Status: DC
  Administered 2014-04-05 – 2014-04-06 (×2): 40 mg via ORAL
  Filled 2014-04-04: qty 1

## 2014-04-04 MED ORDER — HYDROCODONE-ACETAMINOPHEN 10-325 MG PO TABS
1.0000 | ORAL_TABLET | Freq: Four times a day (QID) | ORAL | Status: DC | PRN
  Administered 2014-04-05 – 2014-04-10 (×9): 2 via ORAL
  Filled 2014-04-04 (×9): qty 2

## 2014-04-04 MED ORDER — ASPIRIN 81 MG PO CHEW
81.0000 mg | CHEWABLE_TABLET | Freq: Two times a day (BID) | ORAL | Status: DC
Start: 2014-04-05 — End: 2014-04-05
  Administered 2014-04-05: 81 mg via ORAL
  Filled 2014-04-04 (×3): qty 1

## 2014-04-04 MED ORDER — SODIUM CHLORIDE 0.9 % IJ SOLN
3.0000 mL | Freq: Two times a day (BID) | INTRAMUSCULAR | Status: DC
  Administered 2014-04-05 – 2014-04-09 (×9): 3 mL via INTRAVENOUS

## 2014-04-04 MED ORDER — CLOTRIMAZOLE 10 MG MT TROC
10.0000 mg | Freq: Every day | OROMUCOSAL | Status: DC
  Administered 2014-04-05 – 2014-04-10 (×24): 10 mg via ORAL
  Filled 2014-04-04 (×32): qty 1

## 2014-04-04 MED ORDER — GABAPENTIN 300 MG PO CAPS
300.0000 mg | ORAL_CAPSULE | Freq: Every evening | ORAL | Status: DC | PRN
  Filled 2014-04-04: qty 1

## 2014-04-04 MED ORDER — CLONAZEPAM 0.5 MG PO TABS
0.5000 mg | ORAL_TABLET | Freq: Every day | ORAL | Status: DC | PRN
  Administered 2014-04-08: 0.5 mg via ORAL
  Filled 2014-04-04: qty 1

## 2014-04-04 NOTE — Consult Note (Signed)
Neurology Consultation Reason for Consult: weakness. Referring Physician: Georges Mouse  CC: Back pain  History is obtained from:PAtient, family  HPI: Adam Walsh is a 73 y.o. male with a history of myasthenia gravis originally diagnosed in . He did well for some time, but in 2014 began having problems once again. He was started on mestinon but had problems with diarrhea. He was unable to tolerate even 1/4 tablet(15mg ). 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 He held it due to infection, but restarted it two weeks ago.    He has ESRD and is on hemodialysis.   He denies any diploplia, difficulty swallowing or difficulty breathing.   His main complaint is mouth and back pain. The mouth has been suspected of being thrush and he has been started on clotrimazole therapy. He has had problems with back pain and had an MRI done recently which shows an extruded disc which could be responsible. He has had an epidural steroid injection for this.     ROS: A 14 point ROS was performed and is negative except as noted in the HPI.   Past Medical History  Diagnosis Date  . Hemodialysis patient     Monday, Wednesday and Friday  . HIT (heparin-induced thrombocytopenia)   . Myasthenia gravis   . Hypertension   . History of pyelonephritis   . History of nephrolithiasis   . Celiac sprue   . Hearing deficit     left hearing aid  . Neuropathy     Peripheral neuropathy  . Pancreatic cyst   . Diverticulitis     Family History: Mother - stroke  Social History: Tob: former smoker  Exam: Current vital signs: BP 136/97 mmHg  Pulse 88  Temp(Src) 98.7 F (37.1 C) (Oral)  Resp 11  Ht 5\' 10"  (1.778 m)  Wt 82.101 kg (181 lb)  BMI 25.97 kg/m2  SpO2 98% Vital signs in last 24 hours: Temp:  [97.7 F (36.5 C)-98.7 F (37.1 C)] 98.7 F (37.1 C) (11/06 1852) Pulse  Rate:  [76-97] 88 (11/06 1852) Resp:  [11-23] 11 (11/06 1852) BP: (122-136)/(71-97) 136/97 mmHg (11/06 1852) SpO2:  [98 %-100 %] 98 % (11/06 1852) Weight:  [82.101 kg (181 lb)] 82.101 kg (181 lb) (11/06 1448)  General: in bed, NAD CV: RRR Mental Status: Patient is awake, alert, oriented to person, place, month, year, and situation. Immediate and remote memory are intact. Patient is able to give a clear and coherent history. No signs of aphasia or neglect Cranial Nerves: II: Visual Fields are full. Pupils are equal, round, and reactive to light.  Discs are difficult to visualize. III,IV, VI: EOMI without ptosis or diploplia.  V: Facial sensation is symmetric to temperature VII: Facial movement is symmetric.  VIII: hearing is intact to voice X: Uvula elevates symmetrically XI: Shoulder shrug is symmetric. XII: tongue is midline without atrophy or fasciculations.  Motor: Tone is normal. Bulk is normal. 4/5 strength was present in all four extremities.  Sensory: Sensation is symmetric to light touch and temperature in the arms and legs. Deep Tendon Reflexes: 2+ and symmetric in the biceps and patellae, and 1+ at the ankles.  Cerebellar: FNF intact bilaterally Gait: Not tested 2/2 multiple medical monitors in teh ed setting.    I have reviewed labs in epic and the results pertinent to this consultation are: Cmp - elevated  creatinine Low albumin  I have reviewed the images obtained:MRI L-spine disc extrusion on the left at L4.   Impression: 73 yo M with complicated history and symptoms. He has generalized weakness at this time without any of the symptoms that originally manifested with his myasthenia(bulbar dysfunction, diploplia, ptosis). His mouth symptoms are unusual and do look like thrush, but it is strange that it is not responding to therapy.   I would favor treating comorbidiities such as infection at this time, but if he does worsen, then IVIG may be considered.   He is  already under the care of an orthopedist for his extruded disc.   Recommendations: 1) Continue prednisone for now, stopping abruptly could precipitate crises.  2) If he continues to decline then IVIG may be considered as he has responded well to this in the past.  3) Pancytopenia workup per internal medicine.  4) treatment of UTI, thrush per IM 5) Agree with CK, TSH.     Roland Rack, MD [facility] [phone]  If 7pm- 7am, please page neurology on call as listed in [place].

## 2014-04-04 NOTE — ED Provider Notes (Signed)
Patient presented to the ER with neurologic weakness. Patient reports that for the last 2 days he has had progressively worsening generalized weakness. He does have a history of myasthenia gravis. Patient has not tolerated other medications, is currently on prednisone for myasthenia gravis. His dose has recently been increased without improvement.  She also complaining of painful sores inside his mouth. He has been diagnosed with thrush and started on antifungal medication without improvement.  Patient reports severe low back pain. This has been chronic and ongoing for some time. Patient reports recent MRI that showed a bulging disc. Patient received some type of injection for this without improvement.  Face to face Exam: HEENT - PERRLA, adherent white patches on tongue and gums Lungs - CTAB Heart - RRR, no M/R/G Abd - S/NT/ND Neuro - alert, oriented x3  Plan: Patient with progressively worsening generalized weakness and history of myasthenia gravis. Patient has recently had prednisone dosing increased without improvement. Patient concerning for worsening myasthenia gravis, will require hospitalization for further management.  Dorothyann Peng, patient's lab work reveals significant leukopenia. Patient has some chronic pancytopenia, but his leukopenia significantly worse than previously. Would need evaluation for bone marrow suppression. With his back pain, would consider multiple myeloma as well, but presumably MRI performed did not show any bone lesions.   Orpah Greek, MD 04/04/14 [id number]

## 2014-04-04 NOTE — H&P (Addendum)
Adam Walsh is an 73 y.o. male.    Pcp:  Donnie Coffin  Neurology: Floyde Parkins Nephrology: Fleet Contras  Chief Complaint: weakness HPI: 73 yo male with hx of ESRD on HD, M, W, F, , myasthenia gravis (dx circa 2011) apparently c/o generalized weakness more than baseline for the past 2 weeks.  C/o back pain (chronic) since being discharge in September.  Pt has radiation of the pain into bilateral lower ext, pt had recent ESI on this past Wednesday at [facility] ortho.  No relief from the back pain. Prednisone was increase to $RemoveBef'20mg'[id number]$  daily about 2 weeks ago for his ? Myasthenia by Dr. Jannifer Franklin,  Also treated for flush recently which was thought to be due to prednisone and abx.  Pt has had decrease in appetite.  Pt was off schedule on his dialysis this past week,  Went Sunday, Monday, Thursday. Rather than M, W, F. Pt denies fever, chills, cough, cp, sob.  Diarrhea intermittently.  Starting a couple of days ago.  Pt has had 3-4 bm today.  Loose stool.  Denies brbpr, black stool.  Cramping in lower abdomen.   Slight dysuria for the past 2 days.  Pt brought to ED and found to have pancytopenia.  Pt will be admitted for uti, diarrhea, pancytopenia.    Past Medical History  Diagnosis Date  . Hemodialysis patient     Monday, Wednesday and Friday  . HIT (heparin-induced thrombocytopenia)   . Myasthenia gravis   . Hypertension   . History of pyelonephritis   . History of nephrolithiasis   . Celiac sprue   . Hearing deficit     left hearing aid  . Neuropathy     Peripheral neuropathy  . Pancreatic cyst   . Diverticulitis     Past Surgical History  Procedure Laterality Date  . Exploratory laparotomy    . Transurethral resection of prostate    . Cholecystectomy    . Av fistula placement, radiocephalic  64/40/34    Left  . Basilic vein transposition  10/06/10    2nd stage Left arm  . Hematoma evacuation  11/23/10    Left Arm   . Cardiac catheterization  04/2012    nl cors, no LV  gram; EF 65% on nuc stress  . Tee without cardioversion N/A 02/12/2014    Procedure: TRANSESOPHAGEAL ECHOCARDIOGRAM (TEE);  Surgeon: Pixie Casino, MD;  Location: [place];  Service: Cardiovascular;  Laterality: N/A;  . Fistulogram Left 02/12/2014    Procedure: FISTULOGRAM;  Surgeon: Conrad Dillingham, MD;  Location: [place];  Service: Vascular;  Laterality: Left;  . Insertion of dialysis catheter Right 02/12/2014    Procedure: INSERTION OF DIALYSIS CATHETER;  Surgeon: Conrad Mountville, MD;  Location: [place];  Service: Vascular;  Laterality: Right;  . Incision and drainage abscess Left 02/12/2014    Procedure: INCISION AND DRAINAGE ABSCESS;  Surgeon: Conrad Mackville, MD;  Location: [place] ;  Service: Vascular;  Laterality: Left;    Family History  Problem Relation Age of Onset  . Stroke Mother   . Diabetes Mother   . Heart disease Brother   . Dementia Sister    Social History:  reports that he quit smoking about 44 years ago. His smoking use included Cigarettes. He smoked 0.00 packs per day. He has never used smokeless tobacco. He reports that he does not drink alcohol or use illicit drugs.  Allergies:  Allergies  Allergen Reactions  . Avelox [Moxifloxacin Hcl In  Nacl] Other (See Comments)    PATIENT QUIT BREATHING / CARDIAC ARREST  . Gluten Anaphylaxis  . Gluten Meal Anaphylaxis  . Ciprofloxacin Other (See Comments)    Myasthenia gravis  . Erythromycin Other (See Comments)    Myasthenia gravis  . Erythromycin Base Other (See Comments)    Myasthenia gravis  . Gemifloxacin Other (See Comments)    Myasthenia gravis  . Gentamicin Other (See Comments)    Myasthenia gravis  . Heparin Other (See Comments)    Myasthenia gravis  . Kanamycin Other (See Comments)    Myasthenia gravis  . Levofloxacin Other (See Comments)    Myasthenia gravis  . Mestinon [Pyridostigmine] Diarrhea and Other (See Comments)    Myasthenia gravis  . Neomycin Other (See Comments)    Myasthenia gravis  . Norfloxacin  Other (See Comments)    Myasthenia gravis  . Quinolones Other (See Comments)    Myasthenia gravis  . Streptomycin Other (See Comments)    Myasthenia gravis  . Tobramycin Other (See Comments)    Myasthenia gravis     (Not in a hospital admission)  Results for orders placed or performed during the hospital encounter of 04/04/14 (from the past 48 hour(s))  CBC     Status: Abnormal   Collection Time: 04/04/14  4:00 PM  Result Value Ref Range   WBC 1.1 (LL) 4.0 - 10.5 K/uL    Comment: REPEATED TO VERIFY CRITICAL RESULT CALLED TO, READ BACK BY AND VERIFIED WITH: Keene Breath [id number] M SHIPMAN    RBC 3.30 (L) 4.22 - 5.81 MIL/uL   Hemoglobin 10.5 (L) 13.0 - 17.0 g/dL   HCT 31.4 (L) 39.0 - 52.0 %   MCV 95.2 78.0 - 100.0 fL   MCH 31.8 26.0 - 34.0 pg   MCHC 33.4 30.0 - 36.0 g/dL   RDW 16.4 (H) 11.5 - 15.5 %   Platelets 35 (L) 150 - 400 K/uL    Comment: SPECIMEN CHECKED FOR CLOTS REPEATED TO VERIFY PLATELET COUNT CONFIRMED BY SMEAR   Comprehensive metabolic panel     Status: Abnormal   Collection Time: 04/04/14  4:00 PM  Result Value Ref Range   Sodium 136 (L) 137 - 147 mEq/L   Potassium 4.5 3.7 - 5.3 mEq/L   Chloride 98 96 - 112 mEq/L   CO2 22 19 - 32 mEq/L   Glucose, Bld 135 (H) 70 - 99 mg/dL   BUN 40 (H) 6 - 23 mg/dL   Creatinine, Ser 5.40 (H) 0.50 - 1.35 mg/dL   Calcium 8.9 8.4 - 10.5 mg/dL   Total Protein 5.3 (L) 6.0 - 8.3 g/dL   Albumin 2.7 (L) 3.5 - 5.2 g/dL   AST 31 0 - 37 U/L    Comment: HEMOLYSIS AT THIS LEVEL MAY AFFECT RESULT   ALT 36 0 - 53 U/L   Alkaline Phosphatase 57 39 - 117 U/L   Total Bilirubin 0.9 0.3 - 1.2 mg/dL   GFR calc non Af Amer 10 (L) >90 mL/min   GFR calc Af Amer 11 (L) >90 mL/min    Comment: (NOTE) The eGFR has been calculated using the CKD EPI equation. This calculation has not been validated in all clinical situations. eGFR's persistently <90 mL/min signify possible Chronic Kidney Disease.    Anion gap 16 (H) 5 - 15   Differential     Status: Abnormal   Collection Time: 04/04/14  4:00 PM  Result Value Ref Range   Neutrophils Relative % 60 43 -  77 %   Neutro Abs 0.6 (L) 1.7 - 7.7 K/uL   Lymphocytes Relative 33 12 - 46 %   Lymphs Abs 0.3 (L) 0.7 - 4.0 K/uL   Monocytes Relative 0 (L) 3 - 12 %   Monocytes Absolute 0.0 (L) 0.1 - 1.0 K/uL   Eosinophils Relative 7 (H) 0 - 5 %   Eosinophils Absolute 0.1 0.0 - 0.7 K/uL   Basophils Relative 0 0 - 1 %   Basophils Absolute 0.0 0.0 - 0.1 K/uL  I-stat troponin, ED (not at [facility])     Status: None   Collection Time: 04/04/14  4:05 PM  Result Value Ref Range   Troponin i, poc 0.01 0.00 - 0.08 ng/mL   Comment 3            Comment: Due to the release kinetics of cTnI, a negative result within the first hours of the onset of symptoms does not rule out myocardial infarction with certainty. If myocardial infarction is still suspected, repeat the test at appropriate intervals.   Urinalysis, Routine w reflex microscopic     Status: Abnormal   Collection Time: 04/04/14  4:30 PM  Result Value Ref Range   Color, Urine YELLOW YELLOW   APPearance CLOUDY (A) CLEAR   Specific Gravity, Urine 1.015 1.005 - 1.030   pH 8.0 5.0 - 8.0   Glucose, UA 500 (A) NEGATIVE mg/dL   Hgb urine dipstick SMALL (A) NEGATIVE   Bilirubin Urine NEGATIVE NEGATIVE   Ketones, ur NEGATIVE NEGATIVE mg/dL   Protein, ur 30 (A) NEGATIVE mg/dL   Urobilinogen, UA 0.2 0.0 - 1.0 mg/dL   Nitrite NEGATIVE NEGATIVE   Leukocytes, UA TRACE (A) NEGATIVE  Urine microscopic-add on     Status: Abnormal   Collection Time: 04/04/14  4:30 PM  Result Value Ref Range   Squamous Epithelial / LPF RARE RARE   WBC, UA 3-6 <3 WBC/hpf   RBC / HPF 3-6 <3 RBC/hpf   Bacteria, UA FEW (A) RARE   Urine-Other AMORPHOUS URATES/PHOSPHATES    Dg Chest 2 View  04/04/2014   CLINICAL DATA:  Mouth swelling for 2 days. Back pain with history of slipped disc. Weakness.  EXAM: CHEST  2 VIEW  COMPARISON:  02/12/2014  FINDINGS: The  right dialysis catheter has been removed. Heart size is mildly enlarged. Surgical clips are identified in the region of the gastroesophageal junction. The lungs are clear. No pulmonary edema.  IMPRESSION: No evidence for acute  abnormality.   Electronically Signed   By: Rosalie Gums M.D.   On: 04/04/2014 15:30    Review of Systems  Constitutional: Positive for malaise/fatigue. Negative for fever, chills, weight loss and diaphoresis.  HENT: Negative for congestion, ear discharge, ear pain, hearing loss, nosebleeds, sore throat and tinnitus.   Eyes: Negative for blurred vision, double vision, photophobia, pain, discharge and redness.  Respiratory: Negative for cough, hemoptysis, sputum production, shortness of breath, wheezing and stridor.   Cardiovascular: Negative for chest pain, palpitations, orthopnea, claudication, leg swelling and PND.  Gastrointestinal: Positive for abdominal pain and diarrhea. Negative for heartburn, nausea, vomiting, constipation, blood in stool and melena.  Genitourinary: Positive for dysuria. Negative for urgency, frequency, hematuria and flank pain.  Musculoskeletal: Positive for back pain. Negative for myalgias, joint pain, falls and neck pain.  Skin: Negative for itching and rash.  Neurological: Positive for weakness and headaches. Negative for dizziness, tingling, tremors, sensory change, speech change, focal weakness, seizures and loss of consciousness.  Endo/Heme/Allergies: Negative  for environmental allergies and polydipsia. Does not bruise/bleed easily.  Psychiatric/Behavioral: Negative for depression, suicidal ideas, hallucinations, memory loss and substance abuse. The patient is not nervous/anxious and does not have insomnia.     Blood pressure 136/97, pulse 88, temperature 98.7 F (37.1 C), temperature source Oral, resp. rate 11, height 5\' 10"  (1.778 m), weight 82.101 kg (181 lb), SpO2 98 %. Physical Exam  Constitutional: He is oriented to person, place, and  time. He appears well-developed and well-nourished.  HENT:  Head: Normocephalic and atraumatic.  Mouth/Throat: No oropharyngeal exudate.  + thrush  Eyes: Conjunctivae and EOM are normal. Pupils are equal, round, and reactive to light. Right eye exhibits no discharge. Left eye exhibits no discharge. No scleral icterus.  Neck: Normal range of motion. Neck supple.  Cardiovascular: Normal rate and regular rhythm.  Exam reveals no gallop and no friction rub.   No murmur heard. Respiratory: Effort normal and breath sounds normal. No respiratory distress. He has no wheezes. He has no rales. He exhibits no tenderness.  GI: Soft. Bowel sounds are normal. He exhibits no distension. There is no tenderness. There is no rebound and no guarding.  Musculoskeletal: Normal range of motion. He exhibits no edema or tenderness.  Neurological: He is alert and oriented to person, place, and time. He has normal reflexes. He displays normal reflexes. No cranial nerve deficit. He exhibits normal muscle tone. Coordination normal.  Skin: Skin is warm and dry. No rash noted. No erythema. No pallor.     Assessment/Plan Weakness secondary to uti, vs myasthenia vs prednisone myopathy vs uremia Neurology consult requested by ED,  Appreciate their input.  Check MRI brain, tsh, cpk, esr,   Pancytopenia: Hold off on methotrexate for now, repeat cbc unable to be done in Ed,  Will repeat in am  Thrush:  Continue with clotrimazole troche 10mg  po 5x per day  Back pain: ? Sciatica, we will observe for now,  Cont hydrocodone  RA:  Hold off on methotrexate.  We will continue with prednisone.   ESRD on HD, M, W, F Please consult nephrology in am to arrange dialysis  Uti:  tx with rocephin 1gm iv qday x 7 days  Diarrhea: check stool studies,   Abdominal discomfort:  Check CT scan abdomen/pelvis r/o diverticulitis    ANN, GROENEVELD 04/04/2014, 7:14 PM

## 2014-04-04 NOTE — Progress Notes (Signed)
After attempting to move bowels pt in SVT and HR 178 @2214  - 2224. HR then dropped to the 80's. Pt did not complain of N/V, and only complained of pain in mouth (due to inflammation from medication). MD notified. Rapid Response notified. Will continue to monitor pt.

## 2014-04-04 NOTE — ED Notes (Signed)
Patient is dialysis patient that has S, M, W, F.   Patient missed Wednesday appointment but had dialysis yesterday.   Patient is complaining of increased weakness and ongoing back pain.   Patient also having blisters in mouth from reaction to medication per wife.

## 2014-04-04 NOTE — ED Notes (Signed)
In triage patient states is diaphoretic, and stating he is going to faint.

## 2014-04-04 NOTE — ED Provider Notes (Signed)
CSN: [id number]     Arrival date & time 04/04/14  1419 History   First MD Initiated Contact with Patient 04/04/14 1511     Chief Complaint  Patient presents with  . Weakness  . Back Pain     (Consider location/radiation/quality/duration/timing/severity/associated sxs/prior Treatment) HPI Comments: Patient with history of myasthenia gravis, recent increase in prednisone dosage (currently at 20 mg daily) for symptom control.  Today [place] reporting sores in mouth, weakness, feeling "jittery", and ongoing back pain.  Patient with history of bulging disk, received injection in back for pain on Wednesday.  Suspicion for thrush, patient is currently on clotrimazole troche and magic mouthwash with lidocaine without improvement in mouth pain.  Patient not able to eat or drink due to mouth discomfort. Last dialysis yesterday.  Patient is a 73 y.o. male presenting with weakness and back pain. The history is provided by the patient, the spouse and medical records. No language interpreter was used.  Weakness This is a recurrent problem. The current episode started in the past 7 days. The problem occurs constantly. The problem has been gradually worsening. Associated symptoms include fatigue, a sore throat and weakness. The symptoms are aggravated by eating and drinking.  Back Pain Associated symptoms: dysuria and weakness     Past Medical History  Diagnosis Date  . Hemodialysis patient     Monday, Wednesday and Friday  . HIT (heparin-induced thrombocytopenia)   . Myasthenia gravis   . Hypertension   . History of pyelonephritis   . History of nephrolithiasis   . Celiac sprue   . Hearing deficit     left hearing aid  . Neuropathy     Peripheral neuropathy  . Pancreatic cyst   . Diverticulitis    Past Surgical History  Procedure Laterality Date  . Exploratory laparotomy    . Transurethral resection of prostate    . Cholecystectomy    . Av fistula placement, radiocephalic  62/37/62    Left  .  Basilic vein transposition  10/06/10    2nd stage Left arm  . Hematoma evacuation  11/23/10    Left Arm   . Cardiac catheterization  04/2012    nl cors, no LV gram; EF 65% on nuc stress  . Tee without cardioversion N/A 02/12/2014    Procedure: TRANSESOPHAGEAL ECHOCARDIOGRAM (TEE);  Surgeon: Pixie Casino, MD;  Location: [place];  Service: Cardiovascular;  Laterality: N/A;  . Fistulogram Left 02/12/2014    Procedure: FISTULOGRAM;  Surgeon: Conrad [place]on [place], MD;  Location: [place];  Service: Vascular;  Laterality: Left;  . Insertion of dialysis catheter Right 02/12/2014    Procedure: INSERTION OF DIALYSIS CATHETER;  Surgeon: Conrad Winlock, MD;  Location: [place];  Service: Vascular;  Laterality: Right;  . Incision and drainage abscess Left 02/12/2014    Procedure: INCISION AND DRAINAGE ABSCESS;  Surgeon: Conrad , MD;  Location: [place];  Service: Vascular;  Laterality: Left;   Family History  Problem Relation Age of Onset  . Stroke Mother   . Diabetes Mother   . Heart disease Brother   . Dementia Sister    History  Substance Use Topics  . Smoking status: Former Smoker    Types: Cigarettes    Quit date: 05/30/1969  . Smokeless tobacco: Never Used  . Alcohol Use: No    Review of Systems  Constitutional: Positive for fatigue.  HENT: Positive for mouth sores and sore throat.   Genitourinary: Positive for dysuria and frequency.  Musculoskeletal:  Positive for back pain.  Neurological: Positive for weakness and light-headedness.  All other systems reviewed and are negative.     Allergies  Avelox; Gluten; Gluten meal; Ciprofloxacin; Erythromycin; Erythromycin base; Gemifloxacin; Gentamicin; Heparin; Kanamycin; Levofloxacin; Mestinon; Neomycin; Norfloxacin; Quinolones; Streptomycin; and Tobramycin  Home Medications   Prior to Admission medications   Medication Sig Start Date End Date Taking? Authorizing Provider  acetaminophen (TYLENOL) 325 MG tablet Take 2 tablets (650 mg total)  by mouth every 6 (six) hours as needed for mild pain (or Fever >/= 101). 02/16/14   Delfina Redwood, MD  aspirin 81 MG tablet Take 162 mg by mouth daily.     Historical Provider, MD  B Complex-C-Folic Acid (DIALYVITE 188 PO) Take 1 tablet by mouth daily.    Historical Provider, MD  calcium acetate (PHOSLO) 667 MG capsule Take 1,334 mg by mouth 5 (five) times daily. With each meal and snack.    Historical Provider, MD  ceFAZolin 2 g in dextrose 5 % 50 mL ivpb Inject 2 g into the vein every Monday, Wednesday, and Friday. AFTER DIALYSIS THOUGH 03/12/14 THEN STOP. 02/16/14   Delfina Redwood, MD  clonazePAM (KLONOPIN) 0.5 MG tablet Take 0.5 mg by mouth daily as needed. 02/12/13   Historical Provider, MD  clotrimazole (MYCELEX) 10 MG troche Take 1 tablet (10 mg total) by mouth 5 (five) times daily. 03/31/14   Kathrynn Ducking, MD  cyclobenzaprine (FLEXERIL) 5 MG tablet Take 1 tablet (5 mg total) by mouth 3 (three) times daily as needed for muscle spasms. 02/16/14   Delfina Redwood, MD  EPOETIN ALFA IJ Inject 11,000 Units as directed once a week.     Historical Provider, MD  folic acid (FOLVITE) 1 MG tablet Take 1 mg by mouth daily.    Historical Provider, MD  gabapentin (NEURONTIN) 300 MG capsule Take 300 mg by mouth at bedtime as needed. For feet/nerve pain    Historical Provider, MD  HYDROcodone-acetaminophen [facility]) 10-325 MG per tablet  03/01/14   Historical Provider, MD  methotrexate (RHEUMATREX) 2.5 MG tablet Take 4 tablets (10 mg total) by mouth once a week. HOLD UNTIL INFECTION CLEARED 02/16/14   Delfina Redwood, MD  metoprolol succinate (TOPROL-XL) 12.5 mg TB24 24 hr tablet Take 0.5 tablets (12.5 mg total) by mouth 2 (two) times daily. 02/16/14   Delfina Redwood, MD  metroNIDAZOLE (FLAGYL) 250 MG tablet  03/05/14   Historical Provider, MD  omeprazole (PRILOSEC) 20 MG capsule Take 20 mg by mouth daily.    Historical Provider, MD  paricalcitol (ZEMPLAR) 5 MCG/ML injection Inject 2 mcg into the  vein every Monday, Wednesday, and Friday. In home dialysis    Historical Provider, MD  predniSONE (DELTASONE) 20 MG tablet Take 1 tablet (20 mg total) by mouth daily. 03/25/14   Kathrynn Ducking, MD   Pulse 97  Temp(Src) 97.7 F (36.5 C) (Oral)  Ht 5\' 10"  (1.778 m)  Wt 181 lb (82.101 kg)  BMI 25.97 kg/m2 Physical Exam  Constitutional: He is oriented to person, place, and time. He appears well-developed and well-nourished.  HENT:  Head: Normocephalic and atraumatic.  White plaque on tongue.  Eyes: Pupils are equal, round, and reactive to light.  Neck: Neck supple.  Cardiovascular: Normal rate, regular rhythm and intact distal pulses.   Pulmonary/Chest: Effort normal and breath sounds normal.  Abdominal: Soft.  Musculoskeletal: He exhibits no edema.  Lymphadenopathy:    He has no cervical adenopathy.  Neurological: He is alert  and oriented to person, place, and time.  Skin: Skin is warm and dry.  Nursing note and vitals reviewed.   ED Course  Procedures (including critical care time) Labs Review Labs Reviewed  CBC  COMPREHENSIVE METABOLIC PANEL  Randolm Idol, ED    Imaging Review Dg Chest 2 View  04/04/2014   CLINICAL DATA:  Mouth swelling for 2 days. Back pain with history of slipped disc. Weakness.  EXAM: CHEST  2 VIEW  COMPARISON:  02/12/2014  FINDINGS: The right dialysis catheter has been removed. Heart size is mildly enlarged. Surgical clips are identified in the region of the gastroesophageal junction. The lungs are clear. No pulmonary edema.  IMPRESSION: No evidence for acute  abnormality.   Electronically Signed   By: Shon Hale M.D.   On: 04/04/2014 15:30     EKG Interpretation None     Patient with history of myasthenia gravis, increasing weakness over the last few days despite increase in prednisone dosage one week ago.  Leukopenic.  Oral thrush without relief from antifungal medication. Patient discussed with and seen by Dr. Betsey Holiday.  Admitted to  hospitalist service, neurology consult requested. MDM   Final diagnoses:  Weakness    Myasthenia gravis with increasing weakness despite increase in prednisone dosage.    Norman Herrlich, NP 04/04/14 2325  Orpah Greek, MD 04/05/14 [phone]

## 2014-04-05 ENCOUNTER — Observation Stay (HOSPITAL_COMMUNITY): Payer: Medicare Other

## 2014-04-05 DIAGNOSIS — I120 Hypertensive chronic kidney disease with stage 5 chronic kidney disease or end stage renal disease: Secondary | ICD-10-CM | POA: Diagnosis present

## 2014-04-05 DIAGNOSIS — D61818 Other pancytopenia: Secondary | ICD-10-CM

## 2014-04-05 DIAGNOSIS — K900 Celiac disease: Secondary | ICD-10-CM | POA: Diagnosis present

## 2014-04-05 DIAGNOSIS — G7000 Myasthenia gravis without (acute) exacerbation: Secondary | ICD-10-CM | POA: Diagnosis not present

## 2014-04-05 DIAGNOSIS — G8929 Other chronic pain: Secondary | ICD-10-CM | POA: Diagnosis present

## 2014-04-05 DIAGNOSIS — B370 Candidal stomatitis: Secondary | ICD-10-CM | POA: Diagnosis not present

## 2014-04-05 DIAGNOSIS — Z8249 Family history of ischemic heart disease and other diseases of the circulatory system: Secondary | ICD-10-CM | POA: Diagnosis not present

## 2014-04-05 DIAGNOSIS — G7001 Myasthenia gravis with (acute) exacerbation: Secondary | ICD-10-CM | POA: Diagnosis not present

## 2014-04-05 DIAGNOSIS — Z992 Dependence on renal dialysis: Secondary | ICD-10-CM | POA: Diagnosis not present

## 2014-04-05 DIAGNOSIS — A0472 Enterocolitis due to Clostridium difficile, not specified as recurrent: Secondary | ICD-10-CM | POA: Diagnosis present

## 2014-04-05 DIAGNOSIS — R531 Weakness: Secondary | ICD-10-CM | POA: Diagnosis present

## 2014-04-05 DIAGNOSIS — K529 Noninfective gastroenteritis and colitis, unspecified: Secondary | ICD-10-CM

## 2014-04-05 DIAGNOSIS — N186 End stage renal disease: Secondary | ICD-10-CM | POA: Diagnosis not present

## 2014-04-05 DIAGNOSIS — G629 Polyneuropathy, unspecified: Secondary | ICD-10-CM | POA: Diagnosis present

## 2014-04-05 DIAGNOSIS — I471 Supraventricular tachycardia: Secondary | ICD-10-CM | POA: Diagnosis present

## 2014-04-05 DIAGNOSIS — N390 Urinary tract infection, site not specified: Secondary | ICD-10-CM | POA: Diagnosis present

## 2014-04-05 DIAGNOSIS — Z833 Family history of diabetes mellitus: Secondary | ICD-10-CM | POA: Diagnosis not present

## 2014-04-05 DIAGNOSIS — Z823 Family history of stroke: Secondary | ICD-10-CM | POA: Diagnosis not present

## 2014-04-05 DIAGNOSIS — M549 Dorsalgia, unspecified: Secondary | ICD-10-CM | POA: Diagnosis present

## 2014-04-05 DIAGNOSIS — Z87891 Personal history of nicotine dependence: Secondary | ICD-10-CM | POA: Diagnosis not present

## 2014-04-05 DIAGNOSIS — Z515 Encounter for palliative care: Secondary | ICD-10-CM | POA: Diagnosis not present

## 2014-04-05 DIAGNOSIS — A047 Enterocolitis due to Clostridium difficile: Secondary | ICD-10-CM | POA: Diagnosis not present

## 2014-04-05 DIAGNOSIS — N2581 Secondary hyperparathyroidism of renal origin: Secondary | ICD-10-CM | POA: Diagnosis present

## 2014-04-05 LAB — CBC WITH DIFFERENTIAL/PLATELET
BASOS ABS: 0 10*3/uL (ref 0.0–0.1)
BASOS PCT: 0 % (ref 0–1)
EOS ABS: 0.2 10*3/uL (ref 0.0–0.7)
Eosinophils Relative: 13 % — ABNORMAL HIGH (ref 0–5)
HCT: 30.1 % — ABNORMAL LOW (ref 39.0–52.0)
Hemoglobin: 10 g/dL — ABNORMAL LOW (ref 13.0–17.0)
LYMPHS PCT: 68 % — AB (ref 12–46)
Lymphs Abs: 1 10*3/uL (ref 0.7–4.0)
MCH: 31.7 pg (ref 26.0–34.0)
MCHC: 33.2 g/dL (ref 30.0–36.0)
MCV: 95.6 fL (ref 78.0–100.0)
MONOS PCT: 1 % — AB (ref 3–12)
Monocytes Absolute: 0 10*3/uL — ABNORMAL LOW (ref 0.1–1.0)
NEUTROS PCT: 18 % — AB (ref 43–77)
Neutro Abs: 0.3 10*3/uL — ABNORMAL LOW (ref 1.7–7.7)
Platelets: 23 10*3/uL — CL (ref 150–400)
RBC: 3.15 MIL/uL — ABNORMAL LOW (ref 4.22–5.81)
RDW: 16.6 % — ABNORMAL HIGH (ref 11.5–15.5)
WBC: 1.5 10*3/uL — ABNORMAL LOW (ref 4.0–10.5)

## 2014-04-05 LAB — COMPREHENSIVE METABOLIC PANEL
ALBUMIN: 2.4 g/dL — AB (ref 3.5–5.2)
ALK PHOS: 53 U/L (ref 39–117)
ALT: 27 U/L (ref 0–53)
AST: 23 U/L (ref 0–37)
Anion gap: 14 (ref 5–15)
BUN: 42 mg/dL — ABNORMAL HIGH (ref 6–23)
CHLORIDE: 98 meq/L (ref 96–112)
CO2: 20 mEq/L (ref 19–32)
Calcium: 8.5 mg/dL (ref 8.4–10.5)
Creatinine, Ser: 6.07 mg/dL — ABNORMAL HIGH (ref 0.50–1.35)
GFR calc Af Amer: 10 mL/min — ABNORMAL LOW (ref >90)
GFR calc non Af Amer: 8 mL/min — ABNORMAL LOW (ref >90)
GLUCOSE: 93 mg/dL (ref 70–99)
POTASSIUM: 4.1 meq/L (ref 3.7–5.3)
SODIUM: 132 meq/L — AB (ref 137–147)
Total Bilirubin: 0.8 mg/dL (ref 0.3–1.2)
Total Protein: 4.9 g/dL — ABNORMAL LOW (ref 6.0–8.3)

## 2014-04-05 LAB — FECAL LACTOFERRIN, QUANT: FECAL LACTOFERRIN: POSITIVE

## 2014-04-05 LAB — RAPID URINE DRUG SCREEN, HOSP PERFORMED
AMPHETAMINES: NOT DETECTED
BENZODIAZEPINES: NOT DETECTED
Barbiturates: NOT DETECTED
Cocaine: NOT DETECTED
OPIATES: NOT DETECTED
TETRAHYDROCANNABINOL: NOT DETECTED

## 2014-04-05 LAB — URINALYSIS, ROUTINE W REFLEX MICROSCOPIC
Bilirubin Urine: NEGATIVE
GLUCOSE, UA: 250 mg/dL — AB
Ketones, ur: NEGATIVE mg/dL
LEUKOCYTES UA: NEGATIVE
Nitrite: NEGATIVE
Protein, ur: 30 mg/dL — AB
Specific Gravity, Urine: 1.012 (ref 1.005–1.030)
UROBILINOGEN UA: 0.2 mg/dL (ref 0.0–1.0)
pH: 8 (ref 5.0–8.0)

## 2014-04-05 LAB — CK TOTAL AND CKMB (NOT AT ARMC)
CK, MB: 1 ng/mL (ref 0.3–4.0)
RELATIVE INDEX: INVALID (ref 0.0–2.5)
Total CK: 17 U/L (ref 7–232)

## 2014-04-05 LAB — URINE MICROSCOPIC-ADD ON

## 2014-04-05 LAB — TROPONIN I
Troponin I: <0.3 ng/mL (ref <0.30)
Troponin I: <0.3 ng/mL (ref <0.30)
Troponin I: <0.3 ng/mL (ref <0.30)

## 2014-04-05 LAB — TYPE AND SCREEN
ABO/RH(D): A POS
ANTIBODY SCREEN: NEGATIVE

## 2014-04-05 LAB — CLOSTRIDIUM DIFFICILE BY PCR: Toxigenic C. Difficile by PCR: POSITIVE — AB

## 2014-04-05 LAB — OCCULT BLOOD X 1 CARD TO LAB, STOOL: Fecal Occult Bld: POSITIVE — AB

## 2014-04-05 LAB — ABO/RH: ABO/RH(D): A POS

## 2014-04-05 LAB — SEDIMENTATION RATE: SED RATE: 15 mm/h (ref 0–16)

## 2014-04-05 LAB — LACTIC ACID, PLASMA: Lactic Acid, Venous: 1.3 mmol/L (ref 0.5–2.2)

## 2014-04-05 LAB — PROCALCITONIN: Procalcitonin: 0.77 ng/mL

## 2014-04-05 MED ORDER — METOPROLOL TARTRATE 25 MG PO TABS
25.0000 mg | ORAL_TABLET | Freq: Two times a day (BID) | ORAL | Status: DC
  Administered 2014-04-05 – 2014-04-09 (×8): 25 mg via ORAL
  Filled 2014-04-05 (×12): qty 1

## 2014-04-05 MED ORDER — METRONIDAZOLE IN NACL 5-0.79 MG/ML-% IV SOLN
500.0000 mg | Freq: Three times a day (TID) | INTRAVENOUS | Status: DC
  Administered 2014-04-05 – 2014-04-10 (×12): 500 mg via INTRAVENOUS
  Filled 2014-04-05 (×17): qty 100

## 2014-04-05 MED ORDER — METOPROLOL TARTRATE 1 MG/ML IV SOLN
2.5000 mg | Freq: Once | INTRAVENOUS | Status: AC
  Administered 2014-04-06: 2.5 mg via INTRAVENOUS
  Filled 2014-04-05: qty 5

## 2014-04-05 MED ORDER — METOPROLOL TARTRATE 1 MG/ML IV SOLN
5.0000 mg | Freq: Once | INTRAVENOUS | Status: AC
  Administered 2014-04-05: 5 mg via INTRAVENOUS
  Filled 2014-04-05: qty 5

## 2014-04-05 MED ORDER — MORPHINE SULFATE 2 MG/ML IJ SOLN
1.0000 mg | INTRAMUSCULAR | Status: DC | PRN
  Administered 2014-04-05 – 2014-04-07 (×2): 1 mg via INTRAVENOUS
  Filled 2014-04-05: qty 1

## 2014-04-05 MED ORDER — FLUCONAZOLE 100 MG PO TABS
100.0000 mg | ORAL_TABLET | Freq: Every day | ORAL | Status: DC
  Administered 2014-04-05 – 2014-04-06 (×2): 100 mg via ORAL
  Filled 2014-04-05 (×3): qty 1

## 2014-04-05 MED ORDER — PARICALCITOL 5 MCG/ML IV SOLN
2.0000 ug | INTRAVENOUS | Status: DC
  Filled 2014-04-05 (×2): qty 0.4

## 2014-04-05 MED ORDER — SODIUM CHLORIDE 0.9 % IV SOLN
Freq: Once | INTRAVENOUS | Status: AC
  Administered 2014-04-05: 10:00:00 via INTRAVENOUS

## 2014-04-05 MED ORDER — VANCOMYCIN 50 MG/ML ORAL SOLUTION
125.0000 mg | Freq: Four times a day (QID) | ORAL | Status: DC
  Administered 2014-04-05 – 2014-04-10 (×17): 125 mg via ORAL
  Filled 2014-04-05 (×25): qty 2.5

## 2014-04-05 MED ORDER — BOOST / RESOURCE BREEZE PO LIQD
1.0000 | Freq: Two times a day (BID) | ORAL | Status: DC
  Administered 2014-04-05 – 2014-04-10 (×3): 1 via ORAL

## 2014-04-05 MED ORDER — ASPIRIN 81 MG PO CHEW: 81.0000 mg | CHEWABLE_TABLET | Freq: Every day | ORAL | Status: DC

## 2014-04-05 MED ORDER — MAGIC MOUTHWASH W/LIDOCAINE
5.0000 mL | Freq: Four times a day (QID) | ORAL | Status: DC | PRN
  Administered 2014-04-05 – 2014-04-07 (×3): 5 mL via ORAL
  Filled 2014-04-05 (×5): qty 5

## 2014-04-05 MED ORDER — NYSTATIN 100000 UNIT/ML MT SUSP
5.0000 mL | Freq: Four times a day (QID) | OROMUCOSAL | Status: DC
  Administered 2014-04-05 – 2014-04-10 (×19): 500000 [IU] via ORAL
  Filled 2014-04-05 (×24): qty 5

## 2014-04-05 MED ORDER — PARICALCITOL 5 MCG/ML IV SOLN
2.0000 ug | Freq: Once | INTRAVENOUS | Status: DC
  Filled 2014-04-05: qty 0.4

## 2014-04-05 MED ORDER — GABAPENTIN 300 MG PO CAPS
300.0000 mg | ORAL_CAPSULE | Freq: Every day | ORAL | Status: DC
  Administered 2014-04-05 – 2014-04-09 (×5): 300 mg via ORAL
  Filled 2014-04-05 (×6): qty 1

## 2014-04-05 MED ORDER — DARBEPOETIN ALFA 100 MCG/0.5ML IJ SOSY
100.0000 ug | PREFILLED_SYRINGE | INTRAMUSCULAR | Status: DC
  Filled 2014-04-05: qty 0.5

## 2014-04-05 MED ORDER — NEPRO/CARBSTEADY PO LIQD
237.0000 mL | Freq: Two times a day (BID) | ORAL | Status: DC
  Administered 2014-04-05 – 2014-04-10 (×3): 237 mL via ORAL

## 2014-04-05 NOTE — Progress Notes (Signed)
[facility] hospitalist progress note. Chief complaint. Tachycardia. History of present illness. This 73 year old male with chronic back pain which initiated on prednisone therapy as an outpatient. He was admitted with complaints of weakness with the etiology is yet unclear. Patient has been suffering from oral thrush and complains of mouth pain. He is being monitored on telemetry and was noted to have bursts of tachycardia lasting 5-10 minutes in the 180-200 range. Rapid response came to the bedside and found patient to be in intermittent SVT. I also came his bedside to evaluate the patient. By the time I arrived he was in normal sinus rhythm. He had no complaints of chest pain or dyspnea. His only complaint was of mild pain secondary to known oral thrush. Vital signs. Temperature 99.3, pulse 85, respiration 20, blood pressure 126/93. O2 sats 99%. General appearance. Well-developed male who is alert and in no distress. Cardiac. Rate and rhythm regular. Lungs. Breath sounds clear and equal. Abdomen. Soft and obese with positive bowel sounds. Impression/plan. Problem #1. Intermittent bursts of SVT. Patient was administered 5 mg of IV metoprolol her rapid response. In the time I spent at bedside the patient remains now normal sinus rhythm. We'll restart his home metoprolol 12.5 mg twice daily with first dose now. Continue with telemetry and nursing will notify of any further changes.

## 2014-04-05 NOTE — Progress Notes (Signed)
Subjective: Patient had no new complaints. He has not experienced diplopia. Speech and swallowing are unchanged. He has not had shortness of breath, including the walking short distances.  Objective: Current vital signs: BP 115/59 mmHg  Pulse 74  Temp(Src) 99.8 F (37.7 C) (Oral)  Resp 17  Ht 5\' 10"  (1.778 m)  Wt 76.6 kg (168 lb 14 oz)  BMI 24.23 kg/m2  SpO2 98%  Neurologic Exam: Alert and in no acute distress. Mental status was normal. Extraocular movements were full and conjugate. Patient had no ptosis of his eyelids, except minimally on the left with upgaze for 30 seconds. Speech was normal. There was minimal weakness of the orbicularis oris oculi and orbicularis oris muscles. Neck flexor strength was 4/5; extensors 5/5. Right and left biceps, triceps and intrinsic hand muscles were normal and strength. He had 4/5 strength of hip flexors and quadriceps, as well as tibialis anterior muscles bilaterally.  Medications: I have reviewed the patient's current medications.  Assessment/Plan: 73 year old man with myasthenia gravis presenting with painful oral thrush as well as pancytopenia. He has diffuse mild weakness which appears to be unchanged from time of admission yesterday.  I'm making no changes in patient's current management and will plan to continue to follow him closely with you. There is no indication for treatment with IVIG or plasmapheresis at this point.  C.R. Nicole Kindred, MD [facility] [phone]  04/05/2014  8:51 AM

## 2014-04-05 NOTE — Progress Notes (Signed)
Pt currently in SVT. HR 200 for approximately 6 min. Rapid Response and Kim MD notified. Will continue to monitor patient.

## 2014-04-05 NOTE — Consult Note (Signed)
[facility] Renal Consultation Note    Indication for Consultation:  Management of ESRD/hemodialysis; anemia, hypertension/volume and secondary hyperparathyroidism PCP:  HPI: Adam Walsh is a 73 y.o. male with complex medical history significant for ESRD ( 4 day a week home HD), myasthenia, HIT, HTN, RA, SVT prue and recent admission for MSSA bacteremia 01/2014 with abscess/cellulitis near AVF and discharged on Ancef through 10/14. He had a temporary TDC which has since been removed and his left upper AVF is being used. He had the onset of back pain during that admission.  He presented to the ED yesterday with progressive weakness and back pain and feeling jittery. He had received an epidural steroid injection on Wed for disc issues and has been taking clotrimazole troches and magic MW without improvement .  He has had difficulty eating solids and drink liquids due to mouth pain. No SOB or cough.Prednisone had been recently increased to 20 per day for myasthenia. He denies vision problems, but has chronic hearing loss. He has had watery green stools without melena and dysuria. His last hemodialysis was Wed. Admission work up in the ED was significant for low WBC and platelets, neg CXR, neg acute findings MRI.  Additionally he was noted to have PSVT last evening tx with IV metoprolol and home BB resumed.  Past Medical History  Diagnosis Date  . Hemodialysis patient     Monday, Wednesday and Friday  . HIT (heparin-induced thrombocytopenia)   . Myasthenia gravis   . Hypertension   . History of pyelonephritis   . History of nephrolithiasis   . Celiac sprue   . Hearing deficit     left hearing aid  . Neuropathy     Peripheral neuropathy  . Pancreatic cyst   . Diverticulitis    Past Surgical History  Procedure Laterality Date  . Exploratory laparotomy    . Transurethral resection of prostate    . Cholecystectomy    . Av fistula placement, radiocephalic  81/82/99    Left  .  Basilic vein transposition  10/06/10    2nd stage Left arm  . Hematoma evacuation  11/23/10    Left Arm   . Cardiac catheterization  04/2012    nl cors, no LV gram; EF 65% on nuc stress  . Tee without cardioversion N/A 02/12/2014    Procedure: TRANSESOPHAGEAL ECHOCARDIOGRAM (TEE);  Surgeon: Pixie Casino, MD;  Location: [place];  Service: Cardiovascular;  Laterality: N/A;  . Fistulogram Left 02/12/2014    Procedure: FISTULOGRAM;  Surgeon: Conrad Yankee Hill, MD;  Location: [place];  Service: Vascular;  Laterality: Left;  . Insertion of dialysis catheter Right 02/12/2014    Procedure: INSERTION OF DIALYSIS CATHETER;  Surgeon: Conrad Fairland, MD;  Location: [place];  Service: Vascular;  Laterality: Right;  . Incision and drainage abscess Left 02/12/2014    Procedure: INCISION AND DRAINAGE ABSCESS;  Surgeon: Conrad Providence, MD;  Location: [place];  Service: Vascular;  Laterality: Left;   Family History  Problem Relation Age of Onset  . Stroke Mother   . Diabetes Mother   . Heart disease Brother   . Dementia Sister    Social History:  reports that he quit smoking about 44 years ago. His smoking use included Cigarettes. He smoked 0.00 packs per day. He has never used smokeless tobacco. He reports that he does not drink alcohol or use illicit drugs. Allergies  Allergen Reactions  . Avelox [Moxifloxacin Hcl In Nacl] Other (See Comments)  PATIENT QUIT BREATHING / CARDIAC ARREST  . Gluten Anaphylaxis  . Gluten Meal Anaphylaxis  . Ciprofloxacin Other (See Comments)    Myasthenia gravis  . Erythromycin Other (See Comments)    Myasthenia gravis  . Erythromycin Base Other (See Comments)    Myasthenia gravis  . Gemifloxacin Other (See Comments)    Myasthenia gravis  . Gentamicin Other (See Comments)    Myasthenia gravis  . Heparin Other (See Comments)    Myasthenia gravis  . Kanamycin Other (See Comments)    Myasthenia gravis  . Levofloxacin Other (See Comments)    Myasthenia gravis  . Mestinon  [Pyridostigmine] Diarrhea and Other (See Comments)    Myasthenia gravis  . Neomycin Other (See Comments)    Myasthenia gravis  . Norfloxacin Other (See Comments)    Myasthenia gravis  . Quinolones Other (See Comments)    Myasthenia gravis  . Streptomycin Other (See Comments)    Myasthenia gravis  . Tobramycin Other (See Comments)    Myasthenia gravis   Prior to Admission medications   Medication Sig Start Date End Date Taking? Authorizing Provider  acetaminophen (TYLENOL) 325 MG tablet Take 2 tablets (650 mg total) by mouth every 6 (six) hours as needed for mild pain (or Fever >/= 101). 02/16/14  Yes Delfina Redwood, MD  aspirin 81 MG tablet Take 81 mg by mouth 2 (two) times daily.    Yes Historical Provider, MD  B Complex-C-Folic Acid (DIALYVITE 989 PO) Take 1 tablet by mouth daily.   Yes Historical Provider, MD  calcium acetate (PHOSLO) 667 MG capsule Take 1,334 mg by mouth 5 (five) times daily. With each meal and snack.   Yes Historical Provider, MD  clotrimazole (MYCELEX) 10 MG troche Take 1 tablet (10 mg total) by mouth 5 (five) times daily. 03/31/14  Yes Kathrynn Ducking, MD  Diphenhyd-Hydrocort-Nystatin (FIRST-DUKES MOUTHWASH MT) Use as directed 5-10 mLs in the mouth or throat every 6 (six) hours. WITH LIDOCAINE   Yes Historical Provider, MD  EPOETIN ALFA IJ Inject 4,000 Units as directed every Monday with hemodialysis.    Yes Historical Provider, MD  folic acid (FOLVITE) 1 MG tablet Take 1 mg by mouth daily.   Yes Historical Provider, MD  gabapentin (NEURONTIN) 300 MG capsule Take 300 mg by mouth at bedtime as needed. For feet/nerve pain   Yes Historical Provider, MD  HYDROcodone-acetaminophen (NORCO) 10-325 MG per tablet Take 1-2 tablets by mouth every 6 (six) hours as needed for moderate pain.  03/01/14  Yes Historical Provider, MD  methotrexate (RHEUMATREX) 2.5 MG tablet Take 4 tablets (10 mg total) by mouth once a week. HOLD UNTIL INFECTION CLEARED 02/16/14  Yes Delfina Redwood, MD  metoprolol succinate (TOPROL-XL) 12.5 mg TB24 24 hr tablet Take 0.5 tablets (12.5 mg total) by mouth 2 (two) times daily. 02/16/14  Yes Delfina Redwood, MD  omeprazole (PRILOSEC) 20 MG capsule Take 20 mg by mouth daily.   Yes Historical Provider, MD  predniSONE (DELTASONE) 20 MG tablet Take 1 tablet (20 mg total) by mouth daily. 03/25/14  Yes Kathrynn Ducking, MD   Current Facility-Administered Medications  Medication Dose Route Frequency Provider Last Rate Last Dose  . 0.9 %  sodium chloride infusion  250 mL Intravenous PRN Jani Gravel, MD      . 0.9 %  sodium chloride infusion   Intravenous Once Ripudeep Krystal Eaton, MD      . acetaminophen (TYLENOL) tablet 650 mg  650 mg Oral Q6H  PRN Jani Gravel, MD      . calcium acetate (PHOSLO) capsule 1,334 mg  1,334 mg Oral TID WC Jani Gravel, MD      . calcium acetate (PHOSLO) capsule 1,334 mg  1,334 mg Oral BID PRN Jani Gravel, MD      . cefTRIAXone (ROCEPHIN) 1 g in dextrose 5 % 50 mL IVPB  1 g Intravenous Q24H Jani Gravel, MD 100 mL/hr at 04/04/14 2015 1 g at 04/04/14 2015  . clonazePAM (KLONOPIN) tablet 0.5 mg  0.5 mg Oral Daily PRN Jani Gravel, MD      . clotrimazole [facility]) troche 10 mg  10 mg Oral 5 X Daily Jani Gravel, MD   10 mg at 04/05/14 1009  . fluconazole (DIFLUCAN) tablet 100 mg  100 mg Oral Daily Ripudeep K Rai, MD   100 mg at 04/05/14 1009  . folic acid (FOLVITE) tablet 1 mg  1 mg Oral Daily Jani Gravel, MD   1 mg at 04/05/14 1009  . gabapentin (NEURONTIN) capsule 300 mg  300 mg Oral QHS Ripudeep K Rai, MD      . HYDROcodone-acetaminophen (NORCO) 10-325 MG per tablet 1-2 tablet  1-2 tablet Oral Q6H PRN Jani Gravel, MD   2 tablet at 04/05/14 0540  . magic mouthwash w/lidocaine  5 mL Oral QID PRN Dianne Dun, NP   5 mL at 04/05/14 0223  . metoprolol tartrate (LOPRESSOR) tablet 25 mg  25 mg Oral BID Ripudeep K Rai, MD   25 mg at 04/05/14 1010  . metroNIDAZOLE (FLAGYL) IVPB 500 mg  500 mg Intravenous Q8H Ripudeep K Rai, MD   500 mg at  04/05/14 1011  . morphine 2 MG/ML injection 1 mg  1 mg Intravenous Q4H PRN Ripudeep K Rai, MD   1 mg at 04/05/14 1011  . multivitamin (RENA-VIT) tablet 1 tablet  1 tablet Oral Daily Jani Gravel, MD   1 tablet at 04/05/14 1010  . nystatin (MYCOSTATIN) 100000 UNIT/ML suspension 500,000 Units  5 mL Oral QID Ripudeep Krystal Eaton, MD   500,000 Units at 04/05/14 1012  . pantoprazole (PROTONIX) EC tablet 40 mg  40 mg Oral Daily Jani Gravel, MD   40 mg at 04/05/14 1012  . predniSONE (DELTASONE) tablet 20 mg  20 mg Oral Daily Jani Gravel, MD   20 mg at 04/05/14 1010  . sodium chloride 0.9 % injection 3 mL  3 mL Intravenous Q12H Jani Gravel, MD   0 mL at 04/05/14 0000  . sodium chloride 0.9 % injection 3 mL  3 mL Intravenous Q12H Jani Gravel, MD   3 mL at 04/05/14 1013  . sodium chloride 0.9 % injection 3 mL  3 mL Intravenous PRN Jani Gravel, MD       Labs: Basic Metabolic Panel:  Recent Labs Lab 04/04/14 1600 04/05/14 0503  NA 136* 132*  K 4.5 4.1  CL 98 98  CO2 22 20  GLUCOSE 135* 93  BUN 40* 42*  CREATININE 5.40* 6.07*  CALCIUM 8.9 8.5   Liver Function Tests:  Recent Labs Lab 04/04/14 1600 04/05/14 0503  AST 31 23  ALT 36 27  ALKPHOS 57 53  BILITOT 0.9 0.8  PROT 5.3* 4.9*  ALBUMIN 2.7* 2.4*   No results for input(s): LIPASE, AMYLASE in the last 168 hours. No results for input(s): AMMONIA in the last 168 hours. CBC:  Recent Labs Lab 04/04/14 1600 04/04/14 2200 04/05/14 0503  WBC 1.1* 1.3* 1.5*  NEUTROABS 0.6* 0.3* 0.3*  HGB 10.5* 10.3* 10.0*  HCT 31.4* 30.9* 30.1*  MCV 95.2 95.1 95.6  PLT 35* 32* 23*   Cardiac Enzymes:  Recent Labs Lab 04/05/14 0503 04/05/14 0905  CKTOTAL 17  --   CKMB 1.0  --   TROPONINI  --  <0.30  Studies/Results: Ct Abdomen Pelvis Wo Contrast  04/05/2014   CLINICAL DATA:  All over abdominal pain. Nausea, vomiting, diarrhea.  EXAM: CT ABDOMEN AND PELVIS WITHOUT CONTRAST  TECHNIQUE: Multidetector CT imaging of the abdomen and pelvis was performed following  the standard protocol without IV contrast.  COMPARISON:  04/29/2012  FINDINGS: Emphysematous changes in the lung bases with scattered peripheral fibrosis. Coronary artery calcifications.  Surgical absence of the gallbladder. Diffuse fatty infiltration of the pancreas. The unenhanced appearance of the liver, spleen, adrenal glands, abdominal aorta, and inferior vena cava is unremarkable. No retroperitoneal lymphadenopathy. Kidneys are atrophic bilaterally. There is a hypodense mass consistent with enlarged lymph node in the root of the mesenteric, measuring about 3.2 cm diameter. This is enlarged since previous study. Additional scattered mesenteric lymph nodes are also mildly enlarged. While these may be reactive, lymphoma or metastasis is not excluded. There is diffuse thickening of the colon wall with mild pericolonic infiltration most consistent with colitis. This could represent infectious or inflammatory etiology. Small bowel are decompressed. Stomach is decompressed. Postoperative changes at the EG junction. No free air or free fluid in the abdomen.  Pelvis: Calcification in the prostate gland. Bladder is decompressed. No free or loculated pelvic fluid collections appendix is not identified. No evidence of diverticulitis. Scattered diverticula in the sigmoid colon. Degenerative changes in the lumbar spine. Slight anterior subluxation of L4 on L5 is likely degenerative. No destructive bone lesions appreciated.  IMPRESSION: Diffuse colonic wall thickening with mild pericolonic infiltration consistent with infectious or inflammatory colitis. Enlarged mesenteric lymph nodes, increasing since previous study, with largest node measuring 3.2 cm diameter. This is indeterminate.   Electronically Signed   By: Lucienne Capers M.D.   On: 04/05/2014 02:09   Dg Chest 2 View  04/04/2014   CLINICAL DATA:  Mouth swelling for 2 days. Back pain with history of slipped disc. Weakness.  EXAM: CHEST  2 VIEW  COMPARISON:   02/12/2014  FINDINGS: The right dialysis catheter has been removed. Heart size is mildly enlarged. Surgical clips are identified in the region of the gastroesophageal junction. The lungs are clear. No pulmonary edema.  IMPRESSION: No evidence for acute  abnormality.   Electronically Signed   By: Shon Hale M.D.   On: 04/04/2014 15:30   Mr Brain Wo Contrast  04/04/2014   CLINICAL DATA:  Initial evaluation for in generalized weakness.  EXAM: MRI HEAD WITHOUT CONTRAST  TECHNIQUE: Multiplanar, multiecho pulse sequences of the brain and surrounding structures were obtained without intravenous contrast.  COMPARISON:  Prior CT from 04/16/2013  FINDINGS: Mild diffuse prominence of the CSF containing spaces is compatible with generalized cerebral atrophy, appropriate for age. Minimal small vessel changes present within the periventricular and deep white matter of both cerebral hemispheres.  No focal parenchymal signal abnormality is identified. No mass lesion, midline shift, or extra-axial fluid collection. Ventricles are normal in size without evidence of hydrocephalus.  No diffusion-weighted signal abnormality is identified to suggest acute intracranial infarct. Gray-white matter differentiation is maintained. Normal flow voids are seen within the intracranial vasculature. No intracranial hemorrhage identified.  The cervicomedullary junction is normal. Incidental note made of a partially empty sella. Pituitary stalk is midline. The globes  and optic nerves demonstrate a normal appearance with normal signal intensity. The  The bone marrow signal intensity is normal. Calvarium is intact. Visualized upper cervical spine is within normal limits.  Benign lipoma noted within the right suboccipital region. Scalp soft tissues are otherwise unremarkable.  Paranasal sinuses are clear.  No mastoid effusion.  IMPRESSION: 1. No acute intracranial infarct or other abnormality identified. 2. Mild atrophy with chronic small vessel  ischemic disease.   Electronically Signed   By: Jeannine Boga M.D.   On: 04/04/2014 21:57    ROS: As per HPI otherwise negative.  Physical Exam: Filed Vitals:   04/05/14 0042 04/05/14 0500 04/05/14 0755 04/05/14 0946  BP: 126/93 106/62 115/59 135/59  Pulse: 85 84 74 71  Temp: 99.3 F (37.4 C) 99.8 F (37.7 C)  98.7 F (37.1 C)  TempSrc:    Oral  Resp: 20 17  18   Height:      Weight:      SpO2: 99% 98% 98% 99%     General:  Frail chronically ill appearing elderly male uncomfortable in bed, breathing easily Head:  Normocephalic, atraumatic, sclera non-icteric, thrush tongue Neck: Supple. JVD not elevated. Lungs: Clear  without wheezes, rales, or rhonchi. Heart: RRR no MRG Abdomen: Soft, non-tender, non-distended with normoactive bowel sounds. Lower extremities: without edema or ischemic changes, no open wounds  Neuro: Alert and oriented X 3. HOH Psych:  Responds to questions appropriately with a normal affect. Dialysis Access: left upper AVF + bruit - no evidence of infection.  Dialysis Orders:  Home HD using NxStage with PureFlow 4X Week, MonWedFriSun, CAR 172, Therapy Fluid 1.0 K 45 Lactate, Volume per Tx 25 (liters), Flow Fraction 45 %, BFR 400, EDW 84 NO heparin Epogen-8000/wk down from 11 K in Sept Paricalcititol 2 mcg 3x week Recent labs: Hgb 11 10/12 tsat 23% and ferritin 1660 11/1 iPTH 314 10/1  Assessment/Plan: 1. [place] - hx of myasthenia- Neuro following; Head MRI neg acute findings; 2. Back pain --per primary 3. Thrush - chlotrimazole troches and nystatin/diflucan 4. UTI - on rocephin 5. PSVT - on BB 6. Pancytopenia - WBC and platelets very low - Hgb only slight lower than most recent outpt Hgb- plan platelet transfusion 7. Diarrhea - CT abdomen c/w infectious vs inflammatory colitis - on metronidzazole 8. ESRD -  On home HD - last HD Wed. - plan HD today- no heparin - he tells me he only runs 2.5 hr 4 days a week 9. Hypertension/volume  - unclear of  true weights 76.6 vs 82 in computer - outpt prior edw 84 but he hasn't been eating well; CXR on adm NAD 10. Anemia  - Epogen 8000 weekly; tsat low at 23% but ferritin high - though suspect could be high for a variety of reasons- given Aranesp 100 11. Metabolic bone disease -  Continue zemplar, binders 12. Nutrition - intake has been very poor due to mouth pain from thrush - on gluten free diet 13. Celiac sprue - with gluten allergy - will ask RD to see due to poor nutrition, thrush and gluten allergy. 14. Multiple antibiotic allergies 15. 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 HX MSSA bacteremia and cellulitis/abscess 01/2014 - repeat BC pending  Myriam Jacobson, PA-C [place] [phone] 04/05/2014, 10:16 AM   Pt seen, examined and agree w A/P as above. ESRD pt with pain in mouth and rash on tongue, difficulty eating. Multiple medical problems including chronic steroids for MG and recent admit  in Sept for MSSA bacteremia from infected AVF treated with I&D, course of Ancef and temporary HD cath. AVF now in use again. He has more than one reason to have thrush.  Plan HD today, will follow.  Kelly Splinter MD pager [phone]    cell [phone] 04/05/2014, 12:15 PM

## 2014-04-05 NOTE — Progress Notes (Signed)
INITIAL NUTRITION ASSESSMENT  DOCUMENTATION CODES Per approved criteria  -Not Applicable   INTERVENTION: -Resource Breeze po BID, each supplement provides 250 kcal and 9 grams of protein - Nepro Shake po BID, each supplement provides 425 kcal and 19 grams protein - RD will continue to monitor.  NUTRITION DIAGNOSIS: Inadequate oral intake related to thrush as evidenced by poor po and wt loss.   Goal: Pt to meet >/= 90% of their estimated nutrition needs   Monitor:  Weight trend, po intake, acceptance of supplements, labs  Reason for Assessment: Nutrition Consult  73 y.o. male  Admitting Dx: Myasthenia gravis with acute exacerbation  ASSESSMENT: 73 y.o. male with complex medical history significant for ESRD ( 4 day a week home HD), myasthenia, HIT, HTN, RA, SVT prue and recent admission for MSSA bacteremia 01/2014 with abscess/cellulitis near AVF and discharged on Ancef through 10/14. He had a temporary TDC which has since been removed and his left upper AVF is being used. He had the onset of back pain during that admission. He presented to the ED yesterday with progressive weakness and back pain and feeling jittery  - Obtained nutritional history from pt's wife.  - Pt has been eating poorly lately and wife suspects that pt has lost weight in his legs and buttocks. She says that he now wears suspenders with his pants.  - Pt currently has thrush and is having a hard time tolerating po intake.  - Wife agreed to encourage pt to try nutritional supplements. Pt has tried [facility] in the past and didn't like them. Agreed to try them again.   Height: Ht Readings from Last 1 Encounters:  04/04/14 5\' 10"  (1.778 m)    Weight: Wt Readings from Last 1 Encounters:  04/04/14 168 lb 14 oz (76.6 kg)    Ideal Body Weight: 73 kg  % Ideal Body Weight: 105%  Wt Readings from Last 10 Encounters:  04/04/14 168 lb 14 oz (76.6 kg)  03/25/14 183 lb (83.008 kg)  03/11/14 184 lb (83.462 kg)   02/28/14 187 lb 6.4 oz (85.004 kg)  02/15/14 174 lb 9.7 oz (79.201 kg)  12/30/13 190 lb (86.183 kg)  11/15/13 188 lb (85.276 kg)  08/29/13 199 lb (90.266 kg)  05/14/13 191 lb (86.637 kg)  04/10/13 192 lb (87.091 kg)    Usual Body Weight: 183 lbs  % Usual Body Weight: 92%  BMI:  Body mass index is 24.23 kg/(m^2).  Estimated Nutritional Needs: Kcal: 2200-2400 Protein: 100-115 g Fluid: 1200 mL fluid restriction  Skin: intact  Diet Order: Diet gluten free  EDUCATION NEEDS: -Education needs addressed   Intake/Output Summary (Last 24 hours) at 04/05/14 1503 Last data filed at 04/05/14 1200  Gross per 24 hour  Intake    270 ml  Output    111 ml  Net    159 ml    Last BM: 11/7   Labs:   Recent Labs Lab 04/04/14 1600 04/05/14 0503  NA 136* 132*  K 4.5 4.1  CL 98 98  CO2 22 20  BUN 40* 42*  CREATININE 5.40* 6.07*  CALCIUM 8.9 8.5  GLUCOSE 135* 93    CBG (last 3)  No results for input(s): GLUCAP in the last 72 hours.  Scheduled Meds: . calcium acetate  1,334 mg Oral TID WC  . clotrimazole  10 mg Oral 5 X Daily  . darbepoetin (ARANESP) injection - DIALYSIS  100 mcg Intravenous Q Sat-HD  . fluconazole  100 mg Oral  Daily  . folic acid  1 mg Oral Daily  . gabapentin  300 mg Oral QHS  . metoprolol tartrate  25 mg Oral BID  . metronidazole  500 mg Intravenous Q8H  . multivitamin  1 tablet Oral Daily  . nystatin  5 mL Oral QID  . pantoprazole  40 mg Oral Daily  . [START ON 04/07/2014] paricalcitol  2 mcg Intravenous Once per day on Mon Wed Fri  . paricalcitol  2 mcg Intravenous Once in dialysis  . predniSONE  20 mg Oral Daily  . sodium chloride  3 mL Intravenous Q12H  . sodium chloride  3 mL Intravenous Q12H  . vancomycin  125 mg Oral 4 times per day    Continuous Infusions:   Past Medical History  Diagnosis Date  . Hemodialysis patient     Monday, Wednesday and Friday  . HIT (heparin-induced thrombocytopenia)   . Myasthenia gravis   . Hypertension    . History of pyelonephritis   . History of nephrolithiasis   . Celiac sprue   . Hearing deficit     left hearing aid  . Neuropathy     Peripheral neuropathy  . Pancreatic cyst   . Diverticulitis     Past Surgical History  Procedure Laterality Date  . Exploratory laparotomy    . Transurethral resection of prostate    . Cholecystectomy    . Av fistula placement, radiocephalic  98/26/41    Left  . Basilic vein transposition  10/06/10    2nd stage Left arm  . Hematoma evacuation  11/23/10    Left Arm   . Cardiac catheterization  04/2012    nl cors, no LV gram; EF 65% on nuc stress  . Tee without cardioversion N/A 02/12/2014    Procedure: TRANSESOPHAGEAL ECHOCARDIOGRAM (TEE);  Surgeon: Pixie Casino, MD;  Location: [place];  Service: Cardiovascular;  Laterality: N/A;  . Fistulogram Left 02/12/2014    Procedure: FISTULOGRAM;  Surgeon: Conrad [place], MD;  Location: [place];  Service: Vascular;  Laterality: Left;  . Insertion of dialysis catheter Right 02/12/2014    Procedure: INSERTION OF DIALYSIS CATHETER;  Surgeon: Conrad Travilah, MD;  Location: [place];  Service: Vascular;  Laterality: Right;  . Incision and drainage abscess Left 02/12/2014    Procedure: INCISION AND DRAINAGE ABSCESS;  Surgeon: Conrad [place], MD;  Location: [place];  Service: Vascular;  Laterality: Left;    Laurette Schimke RD, LDN

## 2014-04-05 NOTE — Progress Notes (Signed)
Patient ID: Adam Walsh  male  MBE:[id number]    DOB: 1941/04/07    DOA: 04/04/2014  PCP: Donnie Coffin, MD  Brief history of present illness  Patient is a 73 year old male with history of ESRD on HD Sun-Mon-Wed-Frid, myasthenia gravis diagnosed 2011, presented with generalized weakness, more than baseline for the past 2 weeks. Patient also complained of chronic back pain since September with radiation of the pain into the bilateral lower extremities, recent ESI on 11/4. Prednisone was increased to 20 mg daily about 2 weeks ago by Dr. Jannifer Franklin. Patient was also treated for oral thrush with Magic mouth wash which is not significantly improving. Also complained of diarrhea in the last 2-3 days with 3-4 bowel movements yesterday with no metaplasia or melena, slight dysuria in the last 2 days. In the ED patient was also found to have pancytopenia with PLT 32K.    Assessment/Plan: Principal Problem:   Myasthenia gravis with acute exacerbation - neurology consulted, recommended continuing prednisone. If no significant improvement, neurology will decide on IVIG or plasma pheresis. - PTOT evaluation  Active Problems: Abdominal pain with diarrhea/acute diffuse colitis - Patient had an MRI pelvis done on 9/30 which had shown diverticulitis, underwent CT abdomen and pelvis during this admission which shows diffuse colitis - FOBT positive, placed on IV Flagyl, check C. Difficile, GI pathogen panel  UTI - Patient had complained of dysuria however UA only shows trace leukocytes, patient was started on IV Rocephin, follow urine culture.  Pancytopenia with thrombocytopenia - Given FOBT positive, platelets 23,000 this morning, will transfuse 1 pharesis platelets    End stage renal disease On hemodialysis - Renal consulted, will likely need dialysis today    Weakness generalized: likely due to myasthenia gravis exacerbation, acute colitis - PTOT evaluation  Oral thrush - Placed on nystatin S+S,  Diflucan  SVTasymptomatic - EKG shows PACs, placed on Lopressor  DVT Prophylaxis:SCDs  Code Status:full code  Family Communication:discussed in detail with patient's wife at the bedside  Disposition:  Consultants:  Neurology  Nephrology  Procedures: none Antibiotics:  IV Rocephin  IV Flagyl  Subjective: Patient seen and examined, and no significant worsening in his weakness, diarrhea  Objective: Weight change:   Intake/Output Summary (Last 24 hours) at 04/05/14 1009 Last data filed at 04/05/14 0946  Gross per 24 hour  Intake    170 ml  Output      1 ml  Net    169 ml   Blood pressure 135/59, pulse 71, temperature 98.7 F (37.1 C), temperature source Oral, resp. rate 18, height 5\' 10"  (1.778 m), weight 76.6 kg (168 lb 14 oz), SpO2 99 %.  Physical Exam: General: Alert and awake, oriented x3, not in any acute distress. CVS: S1-S2 clear, no murmur rubs or gallops Chest: clear to auscultation bilaterally, no wheezing, rales or rhonchi Abdomen: soft mild diffuse tenderness, nondistended, normal bowel sounds  Extremities: no cyanosis, clubbing or edema noted bilaterally  Lab Results: Basic Metabolic Panel:  Recent Labs Lab 04/04/14 1600 04/05/14 0503  NA 136* 132*  K 4.5 4.1  CL 98 98  CO2 22 20  GLUCOSE 135* 93  BUN 40* 42*  CREATININE 5.40* 6.07*  CALCIUM 8.9 8.5   Liver Function Tests:  Recent Labs Lab 04/04/14 1600 04/05/14 0503  AST 31 23  ALT 36 27  ALKPHOS 57 53  BILITOT 0.9 0.8  PROT 5.3* 4.9*  ALBUMIN 2.7* 2.4*   No results for input(s): LIPASE, AMYLASE in the last 168  hours. No results for input(s): AMMONIA in the last 168 hours. CBC:  Recent Labs Lab 04/04/14 2200 04/05/14 0503  WBC 1.3* 1.5*  NEUTROABS 0.3* 0.3*  HGB 10.3* 10.0*  HCT 30.9* 30.1*  MCV 95.1 95.6  PLT 32* 23*   Cardiac Enzymes:  Recent Labs Lab 04/05/14 0503 04/05/14 0905  CKTOTAL 17  --   CKMB 1.0  --   TROPONINI  --  <0.30   BNP: Invalid  input(s): POCBNP CBG: No results for input(s): GLUCAP in the last 168 hours.   Micro Results: No results found for this or any previous visit (from the past 240 hour(s)).  Studies/Results: Ct Abdomen Pelvis Wo Contrast  04/05/2014   CLINICAL DATA:  All over abdominal pain. Nausea, vomiting, diarrhea.  EXAM: CT ABDOMEN AND PELVIS WITHOUT CONTRAST  TECHNIQUE: Multidetector CT imaging of the abdomen and pelvis was performed following the standard protocol without IV contrast.  COMPARISON:  04/29/2012  FINDINGS: Emphysematous changes in the lung bases with scattered peripheral fibrosis. Coronary artery calcifications.  Surgical absence of the gallbladder. Diffuse fatty infiltration of the pancreas. The unenhanced appearance of the liver, spleen, adrenal glands, abdominal aorta, and inferior vena cava is unremarkable. No retroperitoneal lymphadenopathy. Kidneys are atrophic bilaterally. There is a hypodense mass consistent with enlarged lymph node in the root of the mesenteric, measuring about 3.2 cm diameter. This is enlarged since previous study. Additional scattered mesenteric lymph nodes are also mildly enlarged. While these may be reactive, lymphoma or metastasis is not excluded. There is diffuse thickening of the colon wall with mild pericolonic infiltration most consistent with colitis. This could represent infectious or inflammatory etiology. Small bowel are decompressed. Stomach is decompressed. Postoperative changes at the EG junction. No free air or free fluid in the abdomen.  Pelvis: Calcification in the prostate gland. Bladder is decompressed. No free or loculated pelvic fluid collections appendix is not identified. No evidence of diverticulitis. Scattered diverticula in the sigmoid colon. Degenerative changes in the lumbar spine. Slight anterior subluxation of L4 on L5 is likely degenerative. No destructive bone lesions appreciated.  IMPRESSION: Diffuse colonic wall thickening with mild  pericolonic infiltration consistent with infectious or inflammatory colitis. Enlarged mesenteric lymph nodes, increasing since previous study, with largest node measuring 3.2 cm diameter. This is indeterminate.   Electronically Signed   By: Lucienne Capers M.D.   On: 04/05/2014 02:09   Dg Chest 2 View  04/04/2014   CLINICAL DATA:  Mouth swelling for 2 days. Back pain with history of slipped disc. Weakness.  EXAM: CHEST  2 VIEW  COMPARISON:  02/12/2014  FINDINGS: The right dialysis catheter has been removed. Heart size is mildly enlarged. Surgical clips are identified in the region of the gastroesophageal junction. The lungs are clear. No pulmonary edema.  IMPRESSION: No evidence for acute  abnormality.   Electronically Signed   By: Shon Hale M.D.   On: 04/04/2014 15:30   Mr Brain Wo Contrast  04/04/2014   CLINICAL DATA:  Initial evaluation for in generalized weakness.  EXAM: MRI HEAD WITHOUT CONTRAST  TECHNIQUE: Multiplanar, multiecho pulse sequences of the brain and surrounding structures were obtained without intravenous contrast.  COMPARISON:  Prior CT from 04/16/2013  FINDINGS: Mild diffuse prominence of the CSF containing spaces is compatible with generalized cerebral atrophy, appropriate for age. Minimal small vessel changes present within the periventricular and deep white matter of both cerebral hemispheres.  No focal parenchymal signal abnormality is identified. No mass lesion, midline shift, or extra-axial fluid  collection. Ventricles are normal in size without evidence of hydrocephalus.  No diffusion-weighted signal abnormality is identified to suggest acute intracranial infarct. Gray-white matter differentiation is maintained. Normal flow voids are seen within the intracranial vasculature. No intracranial hemorrhage identified.  The cervicomedullary junction is normal. Incidental note made of a partially empty sella. Pituitary stalk is midline. The globes and optic nerves demonstrate a normal  appearance with normal signal intensity. The  The bone marrow signal intensity is normal. Calvarium is intact. Visualized upper cervical spine is within normal limits.  Benign lipoma noted within the right suboccipital region. Scalp soft tissues are otherwise unremarkable.  Paranasal sinuses are clear.  No mastoid effusion.  IMPRESSION: 1. No acute intracranial infarct or other abnormality identified. 2. Mild atrophy with chronic small vessel ischemic disease.   Electronically Signed   By: Jeannine Boga M.D.   On: 04/04/2014 21:57   Dg Shoulder Left  03/25/2014   CLINICAL DATA:  Progressive loss of range of motion of the left shoulder. No known injury.  EXAM: LEFT SHOULDER - 2+ VIEW  COMPARISON:  None.  FINDINGS: Mild AC joint degenerative changes. No acute bony findings. The visualized left lung is clear.  IMPRESSION: No acute bony findings or significant degenerative changes.   Electronically Signed   By: Kalman Jewels M.D.   On: 03/25/2014 17:08    Medications: Scheduled Meds: . sodium chloride   Intravenous Once  . calcium acetate  1,334 mg Oral TID WC  . cefTRIAXone (ROCEPHIN)  IV  1 g Intravenous Q24H  . clotrimazole  10 mg Oral 5 X Daily  . fluconazole  100 mg Oral Daily  . folic acid  1 mg Oral Daily  . gabapentin  300 mg Oral QHS  . metoprolol tartrate  25 mg Oral BID  . metronidazole  500 mg Intravenous Q8H  . multivitamin  1 tablet Oral Daily  . nystatin  5 mL Oral QID  . pantoprazole  40 mg Oral Daily  . predniSONE  20 mg Oral Daily  . sodium chloride  3 mL Intravenous Q12H  . sodium chloride  3 mL Intravenous Q12H      LOS: 1 day   Glennda Weatherholtz M.D. [facility] 04/05/2014, 10:09 AM Pager: [phone]  If 7PM-7AM, please contact night-coverage [email or web address] Password [email or web address]

## 2014-04-05 NOTE — Plan of Care (Signed)
Problem: Discharge Progression Outcomes Goal: Pain controlled with appropriate interventions Outcome: Completed/Met Date Met:  04/05/14 Patients oral thrush pain controlled with magic mouthwash w/ lidocaine.

## 2014-04-05 NOTE — Significant Event (Signed)
Rapid Response Event Note  Overview:  Called to see patient for sudden onset of respiratory difficulty    Initial Focused Assessment: UPon arrival patient is seated on bed, dangling, with rapid shallow respirations. SOme light moaning. Responds with short answers to questions. Lungs clear with good air movement to posterior auscultation. VSS with BP, Sats, HR all ok. Tongue quite coated, and back of throat with red petecial type spots.  Interventions: MD arrived and ordered dialysis, assessed patient.  Event Summary:   Patient stable at  end of call, awaiting dialysis.    Follow up at  approx 1300 by phone with RN. Patient is sleeping with no further distress.         Baron Hamper

## 2014-04-06 ENCOUNTER — Encounter (HOSPITAL_COMMUNITY): Payer: Self-pay | Admitting: Internal Medicine

## 2014-04-06 DIAGNOSIS — R1084 Generalized abdominal pain: Secondary | ICD-10-CM

## 2014-04-06 DIAGNOSIS — I471 Supraventricular tachycardia: Secondary | ICD-10-CM

## 2014-04-06 DIAGNOSIS — N186 End stage renal disease: Secondary | ICD-10-CM

## 2014-04-06 LAB — BASIC METABOLIC PANEL
Anion gap: 12 (ref 5–15)
BUN: 25 mg/dL — ABNORMAL HIGH (ref 6–23)
CO2: 26 mEq/L (ref 19–32)
Calcium: 8.3 mg/dL — ABNORMAL LOW (ref 8.4–10.5)
Chloride: 100 mEq/L (ref 96–112)
Creatinine, Ser: 4.26 mg/dL — ABNORMAL HIGH (ref 0.50–1.35)
GFR, EST AFRICAN AMERICAN: 15 mL/min — AB (ref >90)
GFR, EST NON AFRICAN AMERICAN: 13 mL/min — AB (ref >90)
Glucose, Bld: 103 mg/dL — ABNORMAL HIGH (ref 70–99)
POTASSIUM: 3.5 meq/L — AB (ref 3.7–5.3)
SODIUM: 138 meq/L (ref 137–147)

## 2014-04-06 LAB — PREPARE PLATELET PHERESIS: UNIT DIVISION: 0

## 2014-04-06 LAB — CBC
HCT: 27.1 % — ABNORMAL LOW (ref 39.0–52.0)
Hemoglobin: 9.1 g/dL — ABNORMAL LOW (ref 13.0–17.0)
MCH: 32.4 pg (ref 26.0–34.0)
MCHC: 33.6 g/dL (ref 30.0–36.0)
MCV: 96.4 fL (ref 78.0–100.0)
PLATELETS: 40 10*3/uL — AB (ref 150–400)
RBC: 2.81 MIL/uL — ABNORMAL LOW (ref 4.22–5.81)
RDW: 16 % — AB (ref 11.5–15.5)
WBC: 1.6 10*3/uL — ABNORMAL LOW (ref 4.0–10.5)

## 2014-04-06 LAB — MAGNESIUM: Magnesium: 1.9 mg/dL (ref 1.5–2.5)

## 2014-04-06 MED ORDER — METOPROLOL TARTRATE 1 MG/ML IV SOLN
2.5000 mg | Freq: Once | INTRAVENOUS | Status: AC
  Administered 2014-04-06: 2.5 mg via INTRAVENOUS
  Filled 2014-04-06: qty 5

## 2014-04-06 MED ORDER — DEXTROSE 5 % IV SOLN
3.0000 g | Freq: Once | INTRAVENOUS | Status: AC
  Administered 2014-04-06: 3 g via INTRAVENOUS
  Filled 2014-04-06: qty 6

## 2014-04-06 NOTE — Progress Notes (Signed)
Late entry for 0845. Received alarm alert that pt heart rate was in the 180s. Pt denied any shortness of breath or pain, except for pain in mouth from thrush. Pt was sitting in the chair, and nephew stated that pt had just gotten up and sat down when RN entered the room. VS taken. WNL for pt baseline. Will continue to monitor. Manya Silvas, RN

## 2014-04-06 NOTE — Progress Notes (Signed)
Subjective:  Feeling slightly better,tolerated breakfast and HD last Night  Objective Vital signs in last 24 hours: Filed Vitals:   04/05/14 2220 04/05/14 2333 04/06/14 0130 04/06/14 0448  BP: 116/68 109/64 95/58 103/50  Pulse: 80 64 69 59  Temp:  99.1 F (37.3 C) 99.1 F (37.3 C) 98.6 F (37 C)  TempSrc:  Oral Oral Oral  Resp: 22 16 16 16   Height:      Weight:  78.2 kg (172 lb 6.4 oz)    SpO2:  100% 95% 98%   Weight change: -3.901 kg (-8 lb 9.6 oz)  Physical Exam: General: Alert , sitting up on Bedside swapping mouth  Heart: RRR, no mur, gallop ,or rub Lungs: CTA bilat  Abdomen: bs pos. ,soft, nontender, nondistended Extremities:no pedal edema  Dialysis Access: pos bruit L UA AVF   Dialysis Orders: Home HD using NxStage with PureFlow 4X Week, MonWedFriSun, CAR 172, Therapy Fluid 1.0 K 45 Lactate, Volume per Tx 25 (liters), Flow Fraction 45 %, BFR 400, EDW 84 NO heparin Epogen-8000/wk down from 11 K in Sept Paricalcititol 2 mcg 3x week Recent labs: Hgb 11 10/12 tsat 23% and ferritin 1660 11/1 iPTH 314 10/1  Assessment/Plan: 1. Weakness - multifactorial, Cdif colitis, painful thrush, back pain, MG 2. ESRD - On home HD MWFSUN- last HD Wed. - Tolerated HD last pm - no heparin -yesterday k 4.1 3. Thrush - chlotrimazole troches and nystatin/diflucan 4. UTI - on rocephin 5. PSVT - Cardiology seeing  On Metoprolol 25 mg bid   ( currently Hrt rate in 90s) 6. Pancytopenia - WBC and platelets very low - Hgb only slight lower than most recent outpt Hgb- plan platelet transfusion Pt's wife says he was just started on methotrexate for RA and had just taken a few doses 7. Diarrhea - CT abdomen c/w infectious vs inflammatory colitis - on metronidzazole 8. Hypertension/volume -  bp 103/50 with  CXR on admit= NAD/ outpt prior edw 84 but he hasn't been eating well;WT post HD 78.2 kg/ On  Metoprolol 25mg  bid with PSVT monitor bp  With HD but no excess vol uf needed . 9. Anemia - Hgb 10.0 ,  Epogen 8000 weekly;given in hosp Aranesp 100 q Sat HD 10. Metabolic bone disease - Continue zemplar, calcium acetate 2 ac 11. Back pain --per primary 12. Nutrition - ALB2.4,  intake  poor due to thrush - on gluten free diet 13. Celiac sprue - with gluten allergy -  RD to see due to poor nutrition, thrush and gluten allergy. 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 HX MSSA bacteremia and cellulitis/abscess AVF 01/2014 - repeat BC pending  Ernest Haber, PA-C [place] [phone] 04/06/2014,8:04 AM  LOS: 2 days   Pt seen, examined, agree w assess/plan as above with additions as indicated. Ritta Slot is still painful, diarrhea not too bad, told wife that these issues may take a few days to turn around.  Ritta Slot is a primary issue, very painful and tender on exam.  On max Rx per primary.  Kelly Splinter MD pager [phone]    cell [phone] 04/06/2014, 2:05 PM     Labs: Basic Metabolic Panel:  Recent Labs Lab 04/04/14 1600 04/05/14 0503  NA 136* 132*  K 4.5 4.1  CL 98 98  CO2 22 20  GLUCOSE 135* 93  BUN 40* 42*  CREATININE 5.40* 6.07*  CALCIUM 8.9 8.5   Liver Function Tests:  Recent Labs Lab 04/04/14 1600 04/05/14 0503  AST  31 23  ALT 36 27  ALKPHOS 57 53  BILITOT 0.9 0.8  PROT 5.3* 4.9*  ALBUMIN 2.7* 2.4*    Recent Labs Lab 04/04/14 1600 04/04/14 2200 04/05/14 0503  WBC 1.1* 1.3* 1.5*  NEUTROABS 0.6* 0.3* 0.3*  HGB 10.5* 10.3* 10.0*  HCT 31.4* 30.9* 30.1*  MCV 95.2 95.1 95.6  PLT 35* 32* 23*   Cardiac Enzymes:  Recent Labs Lab 04/05/14 0503 04/05/14 0905 04/05/14 1800 04/05/14 2100  CKTOTAL 17  --   --   --   CKMB 1.0  --   --   --   TROPONINI  --  <0.30 <0.30 <0.30   Studies/Results: Ct Abdomen Pelvis Wo Contrast  04/05/2014   CLINICAL DATA:  All over abdominal pain. Nausea, vomiting, diarrhea.  EXAM: CT ABDOMEN AND PELVIS WITHOUT CONTRAST  TECHNIQUE: Multidetector CT imaging of the abdomen and pelvis was performed  following the standard protocol without IV contrast.  COMPARISON:  04/29/2012  FINDINGS: Emphysematous changes in the lung bases with scattered peripheral fibrosis. Coronary artery calcifications.  Surgical absence of the gallbladder. Diffuse fatty infiltration of the pancreas. The unenhanced appearance of the liver, spleen, adrenal glands, abdominal aorta, and inferior vena cava is unremarkable. No retroperitoneal lymphadenopathy. Kidneys are atrophic bilaterally. There is a hypodense mass consistent with enlarged lymph node in the root of the mesenteric, measuring about 3.2 cm diameter. This is enlarged since previous study. Additional scattered mesenteric lymph nodes are also mildly enlarged. While these may be reactive, lymphoma or metastasis is not excluded. There is diffuse thickening of the colon wall with mild pericolonic infiltration most consistent with colitis. This could represent infectious or inflammatory etiology. Small bowel are decompressed. Stomach is decompressed. Postoperative changes at the EG junction. No free air or free fluid in the abdomen.  Pelvis: Calcification in the prostate gland. Bladder is decompressed. No free or loculated pelvic fluid collections appendix is not identified. No evidence of diverticulitis. Scattered diverticula in the sigmoid colon. Degenerative changes in the lumbar spine. Slight anterior subluxation of L4 on L5 is likely degenerative. No destructive bone lesions appreciated.  IMPRESSION: Diffuse colonic wall thickening with mild pericolonic infiltration consistent with infectious or inflammatory colitis. Enlarged mesenteric lymph nodes, increasing since previous study, with largest node measuring 3.2 cm diameter. This is indeterminate.   Electronically Signed   By: Lucienne Capers M.D.   On: 04/05/2014 02:09   Dg Chest 2 View  04/04/2014   CLINICAL DATA:  Mouth swelling for 2 days. Back pain with history of slipped disc. Weakness.  EXAM: CHEST  2 VIEW   COMPARISON:  02/12/2014  FINDINGS: The right dialysis catheter has been removed. Heart size is mildly enlarged. Surgical clips are identified in the region of the gastroesophageal junction. The lungs are clear. No pulmonary edema.  IMPRESSION: No evidence for acute  abnormality.   Electronically Signed   By: Shon Hale M.D.   On: 04/04/2014 15:30   Mr Brain Wo Contrast  04/04/2014   CLINICAL DATA:  Initial evaluation for in generalized weakness.  EXAM: MRI HEAD WITHOUT CONTRAST  TECHNIQUE: Multiplanar, multiecho pulse sequences of the brain and surrounding structures were obtained without intravenous contrast.  COMPARISON:  Prior CT from 04/16/2013  FINDINGS: Mild diffuse prominence of the CSF containing spaces is compatible with generalized cerebral atrophy, appropriate for age. Minimal small vessel changes present within the periventricular and deep white matter of both cerebral hemispheres.  No focal parenchymal signal abnormality is identified. No mass lesion, midline  shift, or extra-axial fluid collection. Ventricles are normal in size without evidence of hydrocephalus.  No diffusion-weighted signal abnormality is identified to suggest acute intracranial infarct. Gray-white matter differentiation is maintained. Normal flow voids are seen within the intracranial vasculature. No intracranial hemorrhage identified.  The cervicomedullary junction is normal. Incidental note made of a partially empty sella. Pituitary stalk is midline. The globes and optic nerves demonstrate a normal appearance with normal signal intensity. The  The bone marrow signal intensity is normal. Calvarium is intact. Visualized upper cervical spine is within normal limits.  Benign lipoma noted within the right suboccipital region. Scalp soft tissues are otherwise unremarkable.  Paranasal sinuses are clear.  No mastoid effusion.  IMPRESSION: 1. No acute intracranial infarct or other abnormality identified. 2. Mild atrophy with chronic  small vessel ischemic disease.   Electronically Signed   By: Jeannine Boga M.D.   On: 04/04/2014 21:57   Medications:   . calcium acetate  1,334 mg Oral TID WC  . clotrimazole  10 mg Oral 5 X Daily  . darbepoetin (ARANESP) injection - DIALYSIS  100 mcg Intravenous Q Sat-HD  . feeding supplement (NEPRO CARB STEADY)  237 mL Oral BID BM  . feeding supplement (RESOURCE BREEZE)  1 Container Oral BID BM  . fluconazole  100 mg Oral Daily  . folic acid  1 mg Oral Daily  . gabapentin  300 mg Oral QHS  . metoprolol tartrate  25 mg Oral BID  . metronidazole  500 mg Intravenous Q8H  . multivitamin  1 tablet Oral Daily  . nystatin  5 mL Oral QID  . pantoprazole  40 mg Oral Daily  . [START ON 04/07/2014] paricalcitol  2 mcg Intravenous Once per day on Mon Wed Fri  . paricalcitol  2 mcg Intravenous Once in dialysis  . predniSONE  20 mg Oral Daily  . sodium chloride  3 mL Intravenous Q12H  . sodium chloride  3 mL Intravenous Q12H  . vancomycin  125 mg Oral 4 times per day

## 2014-04-06 NOTE — Progress Notes (Signed)
Subjective: [name] had no new complaints. He said no diplopia. Speech has not changed. There's been no change in extremity strength. He's also had no dysphagia.  Objective: Current vital signs: BP 93/65 mmHg  Pulse 76  Temp(Src) 99.3 F (37.4 C) (Oral)  Resp 14  Ht 5\' 10"  (1.778 m)  Wt 78.2 kg (172 lb 6.4 oz)  BMI 24.74 kg/m2  SpO2 99%  Neurologic Exam: Patient was alert and in no acute distress. Mental status was normal except for somewhat depressed affect. Extraocular movements were full and conjugate. No ptosis of eyelids was noted. Minimal orbicularis oris oculi and mild orbicularis oris weakness noted. Extremities strength was unchanged.  Medications: I have reviewed the patient's current medications.  Assessment/Plan: 73 year old man with myasthenia gravis being treated for painful oral thrush and also being worked up for pancytopenia. He has no signs of acute deterioration of myasthenia gravis. He is currently on prednisone 20 mg per day. He has been unable to tolerate Mestinon even at 15 mg.  Recommending no changes in current management. We will obtain a follow this patient with you.  C.R. Nicole Kindred, MD [facility] [phone]  04/06/2014  9:53 AM

## 2014-04-06 NOTE — Progress Notes (Signed)
Late Entry - During the night, the patient has multiple telemetry alerts indicating HR in the 150's to 170's.  His VS remained stable through out the night.  He was asymptomatic.  [facility] made aware and 2.5 IV metoprolol was given at [place] and again at 0235.  Per discussion with [facility] and cardiologist, ok to set parameters to alarm if patient sustains HR 160 bpm or greater for 2 minutes or greater.  Will continue to monitor patient.  He remains asymptomatic.  Earleen Reaper RN-BC, [employer]

## 2014-04-06 NOTE — Consult Note (Signed)
Reason for Consult: SVT Referring Physician: Rogue Walsh Primary cardiologist: Dr. Coletta Memos Adam Walsh is an 73 y.o. male.  HPI: 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 Cardiology consulted this evening given SVT. He is accompanied by his nephew and he was sleeping initially on my exam. He's been asymptomatic during his SVT runs this evening. From 02:xx to 03:xx. During his 9/12-9/15 admission he was found to have SVT (long RP tachycardia) particularly during hemodialysis with a history of intermittent tachypalpitations particularly around dialysis times. He was started on toprol XL and option of increased dose or diltiazem. He also had an unrevealing TEE given staph bacteremia and TTE with preserved EF. He previously followed with Dr. Johnsie Walsh and had a pre-operative evaluation in 2013 for potential renal transplant and no significant CAD found.    Past Medical History  Diagnosis Date  . Hemodialysis patient     Monday, Wednesday and Friday  . HIT (heparin-induced thrombocytopenia)   . Myasthenia gravis   . Hypertension   . History of pyelonephritis   . History of nephrolithiasis   . Celiac sprue   . Hearing deficit     left hearing aid  . Neuropathy     Peripheral neuropathy  . Pancreatic cyst   . Diverticulitis     Past Surgical History  Procedure Laterality Date  . Exploratory laparotomy    . Transurethral resection of prostate    . Cholecystectomy    . Av fistula placement, radiocephalic  87/86/76    Left  . Basilic vein transposition  10/06/10    2nd stage Left arm  . Hematoma evacuation  11/23/10    Left Arm   . Cardiac catheterization  04/2012    nl cors, no LV gram; EF 65% on nuc stress  . Tee without cardioversion N/A 02/12/2014      Procedure: TRANSESOPHAGEAL ECHOCARDIOGRAM (TEE);  Surgeon: Pixie Casino, MD;  Location: [place];  Service: Cardiovascular;  Laterality: N/A;  . Fistulogram Left 02/12/2014    Procedure: FISTULOGRAM;  Surgeon: Conrad St. Henry, MD;  Location: [place];  Service: Vascular;  Laterality: Left;  . Insertion of dialysis catheter Right 02/12/2014    Procedure: INSERTION OF DIALYSIS CATHETER;  Surgeon: Conrad Centerville, MD;  Location: [place] [facility];  Service: Vascular;  Laterality: Right;  . Incision and drainage abscess Left 02/12/2014    Procedure: INCISION AND DRAINAGE ABSCESS;  Surgeon: Conrad , MD;  Location: [facility];  Service: Vascular;  Laterality: Left;    Family History  Problem Relation Age of Onset  . Stroke Mother   . Diabetes Mother   . Heart disease Brother   . Dementia Sister     Social History:  reports that he quit smoking about 44 years ago. His smoking use included Cigarettes. He smoked 0.00 packs per day. He has never used smokeless tobacco. He reports that he does not drink alcohol or use illicit drugs.  Allergies:  Allergies  Allergen Reactions  . Avelox [Moxifloxacin Hcl In Nacl] Other (See Comments)    PATIENT QUIT BREATHING / CARDIAC ARREST  . Gluten Anaphylaxis  . Gluten Meal Anaphylaxis  . Ciprofloxacin Other (See Comments)    Myasthenia gravis  . Erythromycin Other (See Comments)    Myasthenia gravis  . Erythromycin  Base Other (See Comments)    Myasthenia gravis  . Gemifloxacin Other (See Comments)    Myasthenia gravis  . Gentamicin Other (See Comments)    Myasthenia gravis  . Heparin Other (See Comments)    Myasthenia gravis  . Kanamycin Other (See Comments)    Myasthenia gravis  . Levofloxacin Other (See Comments)    Myasthenia gravis  . Mestinon [Pyridostigmine] Diarrhea and Other (See Comments)    Myasthenia gravis  . Neomycin Other (See Comments)    Myasthenia gravis  . Norfloxacin Other (See Comments)    Myasthenia gravis  . Quinolones Other (See  Comments)    Myasthenia gravis  . Streptomycin Other (See Comments)    Myasthenia gravis  . Tobramycin Other (See Comments)    Myasthenia gravis    Medications:  I have reviewed the patient's current medications. Prior to Admission:  Prescriptions prior to admission  Medication Sig Dispense Refill Last Dose  . acetaminophen (TYLENOL) 325 MG tablet Take 2 tablets (650 mg total) by mouth every 6 (six) hours as needed for mild pain (or Fever >/= 101).   04/03/2014 at Unknown time  . aspirin 81 MG tablet Take 81 mg by mouth 2 (two) times daily.    04/04/2014 at Unknown time  . B Complex-C-Folic Acid (DIALYVITE 034 PO) Take 1 tablet by mouth daily.   04/03/2014 at Unknown time  . calcium acetate (PHOSLO) 667 MG capsule Take 1,334 mg by mouth 5 (five) times daily. With each meal and snack.   04/03/2014 at Unknown time  . clotrimazole (MYCELEX) 10 MG troche Take 1 tablet (10 mg total) by mouth 5 (five) times daily. 70 tablet 0 04/03/2014 at Unknown time  . Diphenhyd-Hydrocort-Nystatin (FIRST-DUKES MOUTHWASH MT) Use as directed 5-10 mLs in the mouth or throat every 6 (six) hours. WITH LIDOCAINE   04/04/2014 at Unknown time  . EPOETIN ALFA IJ Inject 4,000 Units as directed every Monday with hemodialysis.    Past Week at Unknown time  . folic acid (FOLVITE) 1 MG tablet Take 1 mg by mouth daily.   04/03/2014 at Unknown time  . gabapentin (NEURONTIN) 300 MG capsule Take 300 mg by mouth at bedtime as needed. For feet/nerve pain   Past Week at Unknown time  . HYDROcodone-acetaminophen (NORCO) 10-325 MG per tablet Take 1-2 tablets by mouth every 6 (six) hours as needed for moderate pain.    04/03/2014 at Unknown time  . methotrexate (RHEUMATREX) 2.5 MG tablet Take 4 tablets (10 mg total) by mouth once a week. HOLD UNTIL INFECTION CLEARED 4 tablet 0 Past Week at Unknown time  . metoprolol succinate (TOPROL-XL) 12.5 mg TB24 24 hr tablet Take 0.5 tablets (12.5 mg total) by mouth 2 (two) times daily. 60 tablet 0 Past  Week at Unknown time  . omeprazole (PRILOSEC) 20 MG capsule Take 20 mg by mouth daily.   04/03/2014 at Unknown time  . predniSONE (DELTASONE) 20 MG tablet Take 1 tablet (20 mg total) by mouth daily. 90 tablet 3 04/04/2014 at Unknown time   Scheduled: . calcium acetate  1,334 mg Oral TID WC  . clotrimazole  10 mg Oral 5 X Daily  . darbepoetin (ARANESP) injection - DIALYSIS  100 mcg Intravenous Q Sat-HD  . feeding supplement (NEPRO CARB STEADY)  237 mL Oral BID BM  . feeding supplement (RESOURCE BREEZE)  1 Container Oral BID BM  . fluconazole  100 mg Oral Daily  . folic acid  1 mg Oral Daily  . gabapentin  300 mg Oral QHS  . metoprolol tartrate  25 mg Oral BID  . metronidazole  500 mg Intravenous Q8H  . multivitamin  1 tablet Oral Daily  . nystatin  5 mL Oral QID  . pantoprazole  40 mg Oral Daily  . [START ON 04/07/2014] paricalcitol  2 mcg Intravenous Once per day on Mon Wed Fri  . paricalcitol  2 mcg Intravenous Once in dialysis  . predniSONE  20 mg Oral Daily  . sodium chloride  3 mL Intravenous Q12H  . sodium chloride  3 mL Intravenous Q12H  . vancomycin  125 mg Oral 4 times per day    Results for orders placed or performed during the hospital encounter of 04/04/14 (from the past 48 hour(s))  CBC     Status: Abnormal   Collection Time: 04/04/14  4:00 PM  Result Value Ref Range   WBC 1.1 (LL) 4.0 - 10.5 K/uL    Comment: REPEATED TO VERIFY CRITICAL RESULT CALLED TO, READ BACK BY AND VERIFIED WITH: Keene Breath [id number] M SHIPMAN    RBC 3.30 (L) 4.22 - 5.81 MIL/uL   Hemoglobin 10.5 (L) 13.0 - 17.0 g/dL   HCT 31.4 (L) 39.0 - 52.0 %   MCV 95.2 78.0 - 100.0 fL   MCH 31.8 26.0 - 34.0 pg   MCHC 33.4 30.0 - 36.0 g/dL   RDW 16.4 (H) 11.5 - 15.5 %   Platelets 35 (L) 150 - 400 K/uL    Comment: SPECIMEN CHECKED FOR CLOTS REPEATED TO VERIFY PLATELET COUNT CONFIRMED BY SMEAR   Comprehensive metabolic panel     Status: Abnormal   Collection Time: 04/04/14  4:00 PM  Result  Value Ref Range   Sodium 136 (L) 137 - 147 mEq/L   Potassium 4.5 3.7 - 5.3 mEq/L   Chloride 98 96 - 112 mEq/L   CO2 22 19 - 32 mEq/L   Glucose, Bld 135 (H) 70 - 99 mg/dL   BUN 40 (H) 6 - 23 mg/dL   Creatinine, Ser 5.40 (H) 0.50 - 1.35 mg/dL   Calcium 8.9 8.4 - 10.5 mg/dL   Total Protein 5.3 (L) 6.0 - 8.3 g/dL   Albumin 2.7 (L) 3.5 - 5.2 g/dL   AST 31 0 - 37 U/L    Comment: HEMOLYSIS AT THIS LEVEL MAY AFFECT RESULT   ALT 36 0 - 53 U/L   Alkaline Phosphatase 57 39 - 117 U/L   Total Bilirubin 0.9 0.3 - 1.2 mg/dL   GFR calc non Af Amer 10 (L) >90 mL/min   GFR calc Af Amer 11 (L) >90 mL/min    Comment: (NOTE) The eGFR has been calculated using the CKD EPI equation. This calculation has not been validated in all clinical situations. eGFR's persistently <90 mL/min signify possible Chronic Kidney Disease.    Anion gap 16 (H) 5 - 15  Differential     Status: Abnormal   Collection Time: 04/04/14  4:00 PM  Result Value Ref Range   Neutrophils Relative % 60 43 - 77 %   Neutro Abs 0.6 (L) 1.7 - 7.7 K/uL   Lymphocytes Relative 33 12 - 46 %   Lymphs Abs 0.3 (L) 0.7 - 4.0 K/uL   Monocytes Relative 0 (L) 3 - 12 %   Monocytes Absolute 0.0 (L) 0.1 - 1.0 K/uL   Eosinophils Relative 7 (H) 0 - 5 %   Eosinophils Absolute 0.1 0.0 - 0.7 K/uL   Basophils Relative 0 0 - 1 %  Basophils Absolute 0.0 0.0 - 0.1 K/uL  I-stat troponin, ED (not at [facility])     Status: None   Collection Time: 04/04/14  4:05 PM  Result Value Ref Range   Troponin i, poc 0.01 0.00 - 0.08 ng/mL   Comment 3            Comment: Due to the release kinetics of cTnI, a negative result within the first hours of the onset of symptoms does not rule out myocardial infarction with certainty. If myocardial infarction is still suspected, repeat the test at appropriate intervals.   Urinalysis, Routine w reflex microscopic     Status: Abnormal   Collection Time: 04/04/14  4:30 PM  Result Value Ref Range   Color, Urine YELLOW YELLOW    APPearance CLOUDY (A) CLEAR   Specific Gravity, Urine 1.015 1.005 - 1.030   pH 8.0 5.0 - 8.0   Glucose, UA 500 (A) NEGATIVE mg/dL   Hgb urine dipstick SMALL (A) NEGATIVE   Bilirubin Urine NEGATIVE NEGATIVE   Ketones, ur NEGATIVE NEGATIVE mg/dL   Protein, ur 30 (A) NEGATIVE mg/dL   Urobilinogen, UA 0.2 0.0 - 1.0 mg/dL   Nitrite NEGATIVE NEGATIVE   Leukocytes, UA TRACE (A) NEGATIVE  Urine microscopic-add on     Status: Abnormal   Collection Time: 04/04/14  4:30 PM  Result Value Ref Range   Squamous Epithelial / LPF RARE RARE   WBC, UA 3-6 <3 WBC/hpf   RBC / HPF 3-6 <3 RBC/hpf   Bacteria, UA FEW (A) RARE   Urine-Other AMORPHOUS URATES/PHOSPHATES   CBC with Differential     Status: Abnormal   Collection Time: 04/04/14 10:00 PM  Result Value Ref Range   WBC 1.3 (LL) 4.0 - 10.5 K/uL    Comment: CRITICAL VALUE NOTED.  VALUE IS CONSISTENT WITH PREVIOUSLY REPORTED AND CALLED VALUE.   RBC 3.25 (L) 4.22 - 5.81 MIL/uL   Hemoglobin 10.3 (L) 13.0 - 17.0 g/dL   HCT 30.9 (L) 39.0 - 52.0 %   MCV 95.1 78.0 - 100.0 fL   MCH 31.7 26.0 - 34.0 pg   MCHC 33.3 30.0 - 36.0 g/dL   RDW 16.4 (H) 11.5 - 15.5 %   Platelets 32 (L) 150 - 400 K/uL    Comment: CONSISTENT WITH PREVIOUS RESULT   Neutrophils Relative % 20 (L) 43 - 77 %   Lymphocytes Relative 66 (H) 12 - 46 %   Monocytes Relative 2 (L) 3 - 12 %   Eosinophils Relative 11 (H) 0 - 5 %   Basophils Relative 1 0 - 1 %   Neutro Abs 0.3 (L) 1.7 - 7.7 K/uL   Lymphs Abs 0.9 0.7 - 4.0 K/uL   Monocytes Absolute 0.0 (L) 0.1 - 1.0 K/uL   Eosinophils Absolute 0.1 0.0 - 0.7 K/uL   Basophils Absolute 0.0 0.0 - 0.1 K/uL   Smear Review MORPHOLOGY UNREMARKABLE   TSH     Status: None   Collection Time: 04/04/14 10:00 PM  Result Value Ref Range   TSH 3.220 0.350 - 4.500 uIU/mL  Fecal lactoferrin     Status: None   Collection Time: 04/04/14 11:52 PM  Result Value Ref Range   Specimen Description STOOL    Special Requests NONE    Fecal Lactoferrin  POSITIVE Performed at [facility]     Report Status 04/05/2014 FINAL   Clostridium Difficile by PCR     Status: Abnormal   Collection Time: 04/04/14 11:52 PM  Result  Value Ref Range   C difficile by pcr POSITIVE (A) NEGATIVE    Comment: CRITICAL RESULT CALLED TO, READ BACK BY AND VERIFIED WITH: COLLINS,C RN 04/04/14 1157 WOOTEN,K   Occult blood card to lab, stool     Status: Abnormal   Collection Time: 04/05/14 12:01 AM  Result Value Ref Range   Fecal Occult Bld POSITIVE (A) NEGATIVE  CBC with Differential     Status: Abnormal   Collection Time: 04/05/14  5:03 AM  Result Value Ref Range   WBC 1.5 (L) 4.0 - 10.5 K/uL   RBC 3.15 (L) 4.22 - 5.81 MIL/uL   Hemoglobin 10.0 (L) 13.0 - 17.0 g/dL   HCT 30.1 (L) 39.0 - 52.0 %   MCV 95.6 78.0 - 100.0 fL   MCH 31.7 26.0 - 34.0 pg   MCHC 33.2 30.0 - 36.0 g/dL   RDW 16.6 (H) 11.5 - 15.5 %   Platelets 23 (LL) 150 - 400 K/uL    Comment: REPEATED TO VERIFY PLATELET COUNT CONFIRMED BY SMEAR CRITICAL RESULT CALLED TO, READ BACK BY AND VERIFIED WITH: SCOTT B.,RN 11.7.15 0620 BY JONESJ    Neutrophils Relative % 18 (L) 43 - 77 %   Lymphocytes Relative 68 (H) 12 - 46 %   Monocytes Relative 1 (L) 3 - 12 %   Eosinophils Relative 13 (H) 0 - 5 %   Basophils Relative 0 0 - 1 %   Neutro Abs 0.3 (L) 1.7 - 7.7 K/uL   Lymphs Abs 1.0 0.7 - 4.0 K/uL   Monocytes Absolute 0.0 (L) 0.1 - 1.0 K/uL   Eosinophils Absolute 0.2 0.0 - 0.7 K/uL   Basophils Absolute 0.0 0.0 - 0.1 K/uL   Smear Review MORPHOLOGY UNREMARKABLE   Comprehensive metabolic panel     Status: Abnormal   Collection Time: 04/05/14  5:03 AM  Result Value Ref Range   Sodium 132 (L) 137 - 147 mEq/L   Potassium 4.1 3.7 - 5.3 mEq/L   Chloride 98 96 - 112 mEq/L   CO2 20 19 - 32 mEq/L   Glucose, Bld 93 70 - 99 mg/dL   BUN 42 (H) 6 - 23 mg/dL   Creatinine, Ser 6.07 (H) 0.50 - 1.35 mg/dL   Calcium 8.5 8.4 - 10.5 mg/dL   Total Protein 4.9 (L) 6.0 - 8.3 g/dL   Albumin 2.4 (L) 3.5 - 5.2  g/dL   AST 23 0 - 37 U/L   ALT 27 0 - 53 U/L   Alkaline Phosphatase 53 39 - 117 U/L   Total Bilirubin 0.8 0.3 - 1.2 mg/dL   GFR calc non Af Amer 8 (L) >90 mL/min   GFR calc Af Amer 10 (L) >90 mL/min    Comment: (NOTE) The eGFR has been calculated using the CKD EPI equation. This calculation has not been validated in all clinical situations. eGFR's persistently <90 mL/min signify possible Chronic Kidney Disease.    Anion gap 14 5 - 15   CK total and CKMB (cardiac)     Status: None   Collection Time: 04/05/14  5:03 AM  Result Value Ref Range   Total CK 17 7 - 232 U/L   CK, MB 1.0 0.3 - 4.0 ng/mL   Relative Index RELATIVE INDEX IS INVALID 0.0 - 2.5    Comment: WHEN CK < 100 U/L          Sedimentation rate     Status: None   Collection Time: 04/05/14  5:03 AM  Result  Value Ref Range   Sed Rate 15 0 - 16 mm/hr  Prepare Pheresed Platelets     Status: None (Preliminary result)   Collection Time: 04/05/14  8:00 AM  Result Value Ref Range   Unit Number [id number]    Blood Component Type PLTPHER LR2    Unit division 00    Status of Unit ISSUED    Transfusion Status OK TO TRANSFUSE   Troponin I     Status: None   Collection Time: 04/05/14  9:05 AM  Result Value Ref Range   Troponin I <0.30 <0.30 ng/mL    Comment:        Due to the release kinetics of cTnI, a negative result within the first hours of the onset of symptoms does not rule out myocardial infarction with certainty. If myocardial infarction is still suspected, repeat the test at appropriate intervals.   Lactic acid, plasma     Status: None   Collection Time: 04/05/14  9:05 AM  Result Value Ref Range   Lactic Acid, Venous 1.3 0.5 - 2.2 mmol/L  Procalcitonin - Baseline     Status: None   Collection Time: 04/05/14  9:05 AM  Result Value Ref Range   Procalcitonin 0.77 ng/mL    Comment:        Interpretation: PCT > 0.5 ng/mL and <= 2 ng/mL: Systemic infection (sepsis) is possible, but other conditions are known  to elevate PCT as well. (NOTE)         ICU PCT Algorithm               Non ICU PCT Algorithm    ----------------------------     ------------------------------         PCT < 0.25 ng/mL                 PCT < 0.1 ng/mL     Stopping of antibiotics            Stopping of antibiotics       strongly encouraged.               strongly encouraged.    ----------------------------     ------------------------------       PCT level decrease by               PCT < 0.25 ng/mL       >= 80% from peak PCT       OR PCT 0.25 - 0.5 ng/mL          Stopping of antibiotics                                             encouraged.     Stopping of antibiotics           encouraged.    ----------------------------     ------------------------------       PCT level decrease by              PCT >= 0.25 ng/mL       < 80% from peak PCT        AND PCT >= 0.5 ng/mL             Continuing antibiotics  encouraged.       Continuing antibiotics            encouraged.    ----------------------------     ------------------------------     PCT level increase compared          PCT > 0.5 ng/mL         with peak PCT AND          PCT >= 0.5 ng/mL             Escalation of antibiotics                                          strongly encouraged.      Escalation of antibiotics        strongly encouraged.   Type and screen     Status: None   Collection Time: 04/05/14  9:05 AM  Result Value Ref Range   ABO/RH(D) A POS    Antibody Screen NEG    Sample Expiration 04/08/2014   ABO/Rh     Status: None   Collection Time: 04/05/14  9:05 AM  Result Value Ref Range   ABO/RH(D) A POS   Urine rapid drug screen (hosp performed)     Status: None   Collection Time: 04/05/14 11:57 AM  Result Value Ref Range   Opiates NONE DETECTED NONE DETECTED   Cocaine NONE DETECTED NONE DETECTED   Benzodiazepines NONE DETECTED NONE DETECTED   Amphetamines NONE DETECTED NONE DETECTED    Tetrahydrocannabinol NONE DETECTED NONE DETECTED   Barbiturates NONE DETECTED NONE DETECTED    Comment:        DRUG SCREEN FOR MEDICAL PURPOSES ONLY.  IF CONFIRMATION IS NEEDED FOR ANY PURPOSE, NOTIFY LAB WITHIN 5 DAYS.        LOWEST DETECTABLE LIMITS FOR URINE DRUG SCREEN Drug Class       Cutoff (ng/mL) Amphetamine      1000 Barbiturate      200 Benzodiazepine   607 Tricyclics       371 Opiates          300 Cocaine          300 THC              50   Urinalysis, Routine w reflex microscopic     Status: Abnormal   Collection Time: 04/05/14 11:57 AM  Result Value Ref Range   Color, Urine YELLOW YELLOW   APPearance CLEAR CLEAR   Specific Gravity, Urine 1.012 1.005 - 1.030   pH 8.0 5.0 - 8.0   Glucose, UA 250 (A) NEGATIVE mg/dL   Hgb urine dipstick TRACE (A) NEGATIVE   Bilirubin Urine NEGATIVE NEGATIVE   Ketones, ur NEGATIVE NEGATIVE mg/dL   Protein, ur 30 (A) NEGATIVE mg/dL   Urobilinogen, UA 0.2 0.0 - 1.0 mg/dL   Nitrite NEGATIVE NEGATIVE   Leukocytes, UA NEGATIVE NEGATIVE  Urine microscopic-add on     Status: None   Collection Time: 04/05/14 11:57 AM  Result Value Ref Range   Squamous Epithelial / LPF RARE RARE   WBC, UA 0-2 <3 WBC/hpf   RBC / HPF 0-2 <3 RBC/hpf  Troponin I     Status: None   Collection Time: 04/05/14  6:00 PM  Result Value Ref Range   Troponin I <0.30 <0.30 ng/mL    Comment:        Due  to the release kinetics of cTnI, a negative result within the first hours of the onset of symptoms does not rule out myocardial infarction with certainty. If myocardial infarction is still suspected, repeat the test at appropriate intervals.   Troponin I     Status: None   Collection Time: 04/05/14  9:00 PM  Result Value Ref Range   Troponin I <0.30 <0.30 ng/mL    Comment:        Due to the release kinetics of cTnI, a negative result within the first hours of the onset of symptoms does not rule out myocardial infarction with certainty. If myocardial  infarction is still suspected, repeat the test at appropriate intervals.     Ct Abdomen Pelvis Wo Contrast  04/05/2014   CLINICAL DATA:  All over abdominal pain. Nausea, vomiting, diarrhea.  EXAM: CT ABDOMEN AND PELVIS WITHOUT CONTRAST  TECHNIQUE: Multidetector CT imaging of the abdomen and pelvis was performed following the standard protocol without IV contrast.  COMPARISON:  04/29/2012  FINDINGS: Emphysematous changes in the lung bases with scattered peripheral fibrosis. Coronary artery calcifications.  Surgical absence of the gallbladder. Diffuse fatty infiltration of the pancreas. The unenhanced appearance of the liver, spleen, adrenal glands, abdominal aorta, and inferior vena cava is unremarkable. No retroperitoneal lymphadenopathy. Kidneys are atrophic bilaterally. There is a hypodense mass consistent with enlarged lymph node in the root of the mesenteric, measuring about 3.2 cm diameter. This is enlarged since previous study. Additional scattered mesenteric lymph nodes are also mildly enlarged. While these may be reactive, lymphoma or metastasis is not excluded. There is diffuse thickening of the colon wall with mild pericolonic infiltration most consistent with colitis. This could represent infectious or inflammatory etiology. Small bowel are decompressed. Stomach is decompressed. Postoperative changes at the EG junction. No free air or free fluid in the abdomen.  Pelvis: Calcification in the prostate gland. Bladder is decompressed. No free or loculated pelvic fluid collections appendix is not identified. No evidence of diverticulitis. Scattered diverticula in the sigmoid colon. Degenerative changes in the lumbar spine. Slight anterior subluxation of L4 on L5 is likely degenerative. No destructive bone lesions appreciated.  IMPRESSION: Diffuse colonic wall thickening with mild pericolonic infiltration consistent with infectious or inflammatory colitis. Enlarged mesenteric lymph nodes, increasing since  previous study, with largest node measuring 3.2 cm diameter. This is indeterminate.   Electronically Signed   By: Lucienne Capers M.D.   On: 04/05/2014 02:09   Dg Chest 2 View  04/04/2014   CLINICAL DATA:  Mouth swelling for 2 days. Back pain with history of slipped disc. Weakness.  EXAM: CHEST  2 VIEW  COMPARISON:  02/12/2014  FINDINGS: The right dialysis catheter has been removed. Heart size is mildly enlarged. Surgical clips are identified in the region of the gastroesophageal junction. The lungs are clear. No pulmonary edema.  IMPRESSION: No evidence for acute  abnormality.   Electronically Signed   By: Shon Hale M.D.   On: 04/04/2014 15:30   Mr Brain Wo Contrast  04/04/2014   CLINICAL DATA:  Initial evaluation for in generalized weakness.  EXAM: MRI HEAD WITHOUT CONTRAST  TECHNIQUE: Multiplanar, multiecho pulse sequences of the brain and surrounding structures were obtained without intravenous contrast.  COMPARISON:  Prior CT from 04/16/2013  FINDINGS: Mild diffuse prominence of the CSF containing spaces is compatible with generalized cerebral atrophy, appropriate for age. Minimal small vessel changes present within the periventricular and deep white matter of both cerebral hemispheres.  No focal parenchymal signal  abnormality is identified. No mass lesion, midline shift, or extra-axial fluid collection. Ventricles are normal in size without evidence of hydrocephalus.  No diffusion-weighted signal abnormality is identified to suggest acute intracranial infarct. Gray-white matter differentiation is maintained. Normal flow voids are seen within the intracranial vasculature. No intracranial hemorrhage identified.  The cervicomedullary junction is normal. Incidental note made of a partially empty sella. Pituitary stalk is midline. The globes and optic nerves demonstrate a normal appearance with normal signal intensity. The  The bone marrow signal intensity is normal. Calvarium is intact. Visualized upper  cervical spine is within normal limits.  Benign lipoma noted within the right suboccipital region. Scalp soft tissues are otherwise unremarkable.  Paranasal sinuses are clear.  No mastoid effusion.  IMPRESSION: 1. No acute intracranial infarct or other abnormality identified. 2. Mild atrophy with chronic small vessel ischemic disease.   Electronically Signed   By: Jeannine Boga M.D.   On: 04/04/2014 21:57    ROS Blood pressure 95/58, pulse 69, temperature 99.1 F (37.3 C), temperature source Oral, resp. rate 16, height _0  (1.778 m), weight 78.2 kg (172 lb 6.4 oz), SpO2 95 %. Physical Exam  Nursing note and vitals reviewed. Constitutional: He is oriented to person, place, and time. No distress.  NAD, appears slightly older than stated age  HENT:  Head: Normocephalic and atraumatic.  Nose: Nose normal.  Mouth/Throat: Oropharynx is clear and moist. No oropharyngeal exudate.  Eyes: Conjunctivae and EOM are normal. Pupils are equal, round, and reactive to light. No scleral icterus.  Neck: Normal range of motion. Neck supple. JVD present. No tracheal deviation present.  Mildly elevated jvp  Cardiovascular: Normal rate, regular rhythm and intact distal pulses.   Murmur heard. Soft systolic murmur at LSB  Respiratory: No respiratory distress. He has no wheezes. He has no rales.  GI: Soft. Bowel sounds are normal. He exhibits no distension. There is no tenderness. There is no rebound.  Musculoskeletal: He exhibits edema. He exhibits no tenderness.  Neurological: He is alert and oriented to person, place, and time. Coordination normal.  Skin: Skin is warm and dry. No rash noted. He is not diaphoretic. No erythema.  Psychiatric: He has a normal mood and affect. His behavior is normal. Thought content normal.   Labs reviewed above ECG 11/07 AM: SR Telemetry: multiple runs of SVT - ? Atrial tachycardia  Recent echo with preserved EF Assessment/Plan: 1. SVT, ? Atrial tachycardia 2.  Pancytopenia 3. Myasthenia Gravis 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 Cardiology consulted this evening for SVT which he had at previous admission. He remains asymptomatic. For now agree with continued treatment with metoprolol 25 mg bid after 2.5 mg IV given twice. Keep electrolytes optimized. I have added on magnesium level. Based on blood pressure, symptoms and SVT burden we can increase metoprolol or potentially add on diltiazem.  - metoprolol 25 mg bid; can titrate up to 50 mg bid based on bp and persistent - optimize electrolytes - consider addition of diltiazem if metoprolol does not appear to be help    Adam Walsh 04/06/2014, 4:43 AM

## 2014-04-06 NOTE — Progress Notes (Signed)
Patient ID: Adam Walsh  male  FKC:[id number]    DOB: Jun 06, 1940    DOA: 04/04/2014  PCP: Donnie Coffin, MD  Brief history of present illness  Patient is a 73 year old male with history of ESRD on HD Sun-Mon-Wed-Frid, myasthenia gravis diagnosed 2011, presented with generalized weakness, more than baseline for the past 2 weeks. Patient also complained of chronic back pain since September with radiation of the pain into the bilateral lower extremities, recent ESI on 11/4. Prednisone was increased to 20 mg daily about 2 weeks ago by Dr. Jannifer Franklin. Patient was also treated for oral thrush with Magic mouth wash which is not significantly improving. Also complained of diarrhea in the last 2-3 days with 3-4 bowel movements yesterday with no metaplasia or melena, slight dysuria in the last 2 days. In the ED patient was also found to have pancytopenia with PLT 32K.    Assessment/Plan: Principal Problem:   Myasthenia gravis with acute exacerbation - neurology consulted, recommended continuing prednisone. If no significant improvement, neurology will decide on IVIG or plasma pheresis. - PTOT evaluation  Active Problems: Abdominal pain with diarrhea/acute diffuse colitis: C. Difficile colitis - Patient had an MRI pelvis done on 9/30 which had shown diverticulitis, underwent CT abdomen and pelvis during this admission which shows diffuse colitis - C. Difficile PCR positive, was placed on IV Flagyl yesterday, also added oral vancomycin - Rocephin was discontinued yesterday  UTI -IV Rocephin was discontinued yesterday, not convinced that he has an UTI, follow urine cultures  Pancytopenia with thrombocytopenia - status post platelets transfusion, platelets improved to 40,000    End stage renal disease On hemodialysis - nephrology following, patient on hemodialysis    Weakness generalized: likely due to myasthenia gravis exacerbation, acute C. Difficile colitis - PTOT evaluation  Oral thrush -  Placed on nystatin S+S, Diflucan  SVTasymptomatic - EKG shows PACs, placed on Lopressor 25 mg BID, titrate up as needed - Cardiology consulted, recommended Cardizem if no improvement with beta blocker  DVT Prophylaxis:SCDs  Code Status:full code  Family Communication:discussed in detail with family member at the bedside  Disposition:  Consultants:  Neurology  Nephrology  cardiology  Procedures: none Antibiotics:  IV Rocephin 11/7-11/7  IV Flagyl 11/7>  Oral vanco 11/7  Subjective: Patient seen and examined, and no significant worsening in his weakness, diarrhea twice yesterday per patient, throat pain is improving  Objective: Weight change: -3.901 kg (-8 lb 9.6 oz)  Intake/Output Summary (Last 24 hours) at 04/06/14 1013 Last data filed at 04/06/14 0700  Gross per 24 hour  Intake   1179 ml  Output   1918 ml  Net   -739 ml   Blood pressure 93/65, pulse 76, temperature 99.3 F (37.4 C), temperature source Oral, resp. rate 14, height 5\' 10"  (1.778 m), weight 78.2 kg (172 lb 6.4 oz), SpO2 99 %.  Physical Exam: General: Alert and awake, oriented x3, NAD. CVS: S1-S2 clear Chest: CTAB Abdomen: soft mild diffuse tenderness, nondistended, normal bowel sounds  Extremities: no c/c/e  Lab Results: Basic Metabolic Panel:  Recent Labs Lab 04/05/14 0503 04/06/14 0530 04/06/14 0740  NA 132*  --  138  K 4.1  --  3.5*  CL 98  --  100  CO2 20  --  26  GLUCOSE 93  --  103*  BUN 42*  --  25*  CREATININE 6.07*  --  4.26*  CALCIUM 8.5  --  8.3*  MG  --  1.9  --  Liver Function Tests:  Recent Labs Lab 04/04/14 1600 04/05/14 0503  AST 31 23  ALT 36 27  ALKPHOS 57 53  BILITOT 0.9 0.8  PROT 5.3* 4.9*  ALBUMIN 2.7* 2.4*   No results for input(s): LIPASE, AMYLASE in the last 168 hours. No results for input(s): AMMONIA in the last 168 hours. CBC:  Recent Labs Lab 04/05/14 0503 04/06/14 0740  WBC 1.5* 1.6*  NEUTROABS 0.3*  --   HGB 10.0* 9.1*  HCT  30.1* 27.1*  MCV 95.6 96.4  PLT 23* 40*   Cardiac Enzymes:  Recent Labs Lab 04/05/14 0503 04/05/14 0905 04/05/14 1800 04/05/14 2100  CKTOTAL 17  --   --   --   CKMB 1.0  --   --   --   TROPONINI  --  <0.30 <0.30 <0.30   BNP: Invalid input(s): POCBNP CBG: No results for input(s): GLUCAP in the last 168 hours.   Micro Results: Recent Results (from the past 240 hour(s))  Clostridium Difficile by PCR     Status: Abnormal   Collection Time: 04/04/14 11:52 PM  Result Value Ref Range Status   C difficile by pcr POSITIVE (A) NEGATIVE Final    Comment: CRITICAL RESULT CALLED TO, READ BACK BY AND VERIFIED WITH: COLLINS,C RN 04/04/14 1157 WOOTEN,K     Studies/Results: Ct Abdomen Pelvis Wo Contrast  04/05/2014   CLINICAL DATA:  All over abdominal pain. Nausea, vomiting, diarrhea.  EXAM: CT ABDOMEN AND PELVIS WITHOUT CONTRAST  TECHNIQUE: Multidetector CT imaging of the abdomen and pelvis was performed following the standard protocol without IV contrast.  COMPARISON:  04/29/2012  FINDINGS: Emphysematous changes in the lung bases with scattered peripheral fibrosis. Coronary artery calcifications.  Surgical absence of the gallbladder. Diffuse fatty infiltration of the pancreas. The unenhanced appearance of the liver, spleen, adrenal glands, abdominal aorta, and inferior vena cava is unremarkable. No retroperitoneal lymphadenopathy. Kidneys are atrophic bilaterally. There is a hypodense mass consistent with enlarged lymph node in the root of the mesenteric, measuring about 3.2 cm diameter. This is enlarged since previous study. Additional scattered mesenteric lymph nodes are also mildly enlarged. While these may be reactive, lymphoma or metastasis is not excluded. There is diffuse thickening of the colon wall with mild pericolonic infiltration most consistent with colitis. This could represent infectious or inflammatory etiology. Small bowel are decompressed. Stomach is decompressed. Postoperative  changes at the EG junction. No free air or free fluid in the abdomen.  Pelvis: Calcification in the prostate gland. Bladder is decompressed. No free or loculated pelvic fluid collections appendix is not identified. No evidence of diverticulitis. Scattered diverticula in the sigmoid colon. Degenerative changes in the lumbar spine. Slight anterior subluxation of L4 on L5 is likely degenerative. No destructive bone lesions appreciated.  IMPRESSION: Diffuse colonic wall thickening with mild pericolonic infiltration consistent with infectious or inflammatory colitis. Enlarged mesenteric lymph nodes, increasing since previous study, with largest node measuring 3.2 cm diameter. This is indeterminate.   Electronically Signed   By: Lucienne Capers M.D.   On: 04/05/2014 02:09   Dg Chest 2 View  04/04/2014   CLINICAL DATA:  Mouth swelling for 2 days. Back pain with history of slipped disc. Weakness.  EXAM: CHEST  2 VIEW  COMPARISON:  02/12/2014  FINDINGS: The right dialysis catheter has been removed. Heart size is mildly enlarged. Surgical clips are identified in the region of the gastroesophageal junction. The lungs are clear. No pulmonary edema.  IMPRESSION: No evidence for acute  abnormality.  Electronically Signed   By: Shon Hale M.D.   On: 04/04/2014 15:30   Mr Brain Wo Contrast  04/04/2014   CLINICAL DATA:  Initial evaluation for in generalized weakness.  EXAM: MRI HEAD WITHOUT CONTRAST  TECHNIQUE: Multiplanar, multiecho pulse sequences of the brain and surrounding structures were obtained without intravenous contrast.  COMPARISON:  Prior CT from 04/16/2013  FINDINGS: Mild diffuse prominence of the CSF containing spaces is compatible with generalized cerebral atrophy, appropriate for age. Minimal small vessel changes present within the periventricular and deep white matter of both cerebral hemispheres.  No focal parenchymal signal abnormality is identified. No mass lesion, midline shift, or extra-axial fluid  collection. Ventricles are normal in size without evidence of hydrocephalus.  No diffusion-weighted signal abnormality is identified to suggest acute intracranial infarct. Gray-white matter differentiation is maintained. Normal flow voids are seen within the intracranial vasculature. No intracranial hemorrhage identified.  The cervicomedullary junction is normal. Incidental note made of a partially empty sella. Pituitary stalk is midline. The globes and optic nerves demonstrate a normal appearance with normal signal intensity. The  The bone marrow signal intensity is normal. Calvarium is intact. Visualized upper cervical spine is within normal limits.  Benign lipoma noted within the right suboccipital region. Scalp soft tissues are otherwise unremarkable.  Paranasal sinuses are clear.  No mastoid effusion.  IMPRESSION: 1. No acute intracranial infarct or other abnormality identified. 2. Mild atrophy with chronic small vessel ischemic disease.   Electronically Signed   By: Jeannine Boga M.D.   On: 04/04/2014 21:57   Dg Shoulder Left  03/25/2014   CLINICAL DATA:  Progressive loss of range of motion of the left shoulder. No known injury.  EXAM: LEFT SHOULDER - 2+ VIEW  COMPARISON:  None.  FINDINGS: Mild AC joint degenerative changes. No acute bony findings. The visualized left lung is clear.  IMPRESSION: No acute bony findings or significant degenerative changes.   Electronically Signed   By: Kalman Jewels M.D.   On: 03/25/2014 17:08    Medications: Scheduled Meds: . calcium acetate  1,334 mg Oral TID WC  . clotrimazole  10 mg Oral 5 X Daily  . darbepoetin (ARANESP) injection - DIALYSIS  100 mcg Intravenous Q Sat-HD  . feeding supplement (NEPRO CARB STEADY)  237 mL Oral BID BM  . feeding supplement (RESOURCE BREEZE)  1 Container Oral BID BM  . fluconazole  100 mg Oral Daily  . folic acid  1 mg Oral Daily  . gabapentin  300 mg Oral QHS  . magnesium sulfate LVP 250-500 ml  3 g Intravenous Once   . metoprolol tartrate  25 mg Oral BID  . metronidazole  500 mg Intravenous Q8H  . multivitamin  1 tablet Oral Daily  . nystatin  5 mL Oral QID  . pantoprazole  40 mg Oral Daily  . [START ON 04/07/2014] paricalcitol  2 mcg Intravenous Once per day on Mon Wed Fri  . paricalcitol  2 mcg Intravenous Once in dialysis  . predniSONE  20 mg Oral Daily  . sodium chloride  3 mL Intravenous Q12H  . sodium chloride  3 mL Intravenous Q12H  . vancomycin  125 mg Oral 4 times per day      LOS: 2 days   RAI,RIPUDEEP M.D. [facility] 04/06/2014, 10:13 AM Pager: [phone]  If 7PM-7AM, please contact night-coverage [email or web address] Password [email or web address]

## 2014-04-07 DIAGNOSIS — I10 Essential (primary) hypertension: Secondary | ICD-10-CM

## 2014-04-07 DIAGNOSIS — I471 Supraventricular tachycardia: Secondary | ICD-10-CM | POA: Insufficient documentation

## 2014-04-07 LAB — CBC
HEMATOCRIT: 27.4 % — AB (ref 39.0–52.0)
Hemoglobin: 9.3 g/dL — ABNORMAL LOW (ref 13.0–17.0)
MCH: 31.7 pg (ref 26.0–34.0)
MCHC: 33.9 g/dL (ref 30.0–36.0)
MCV: 93.5 fL (ref 78.0–100.0)
Platelets: 33 10*3/uL — ABNORMAL LOW (ref 150–400)
RBC: 2.93 MIL/uL — ABNORMAL LOW (ref 4.22–5.81)
RDW: 15.8 % — AB (ref 11.5–15.5)
WBC: 1.6 10*3/uL — ABNORMAL LOW (ref 4.0–10.5)

## 2014-04-07 LAB — BASIC METABOLIC PANEL
Anion gap: 17 — ABNORMAL HIGH (ref 5–15)
BUN: 37 mg/dL — ABNORMAL HIGH (ref 6–23)
CALCIUM: 9 mg/dL (ref 8.4–10.5)
CO2: 23 mEq/L (ref 19–32)
CREATININE: 5.94 mg/dL — AB (ref 0.50–1.35)
Chloride: 98 mEq/L (ref 96–112)
GFR calc non Af Amer: 8 mL/min — ABNORMAL LOW (ref >90)
GFR, EST AFRICAN AMERICAN: 10 mL/min — AB (ref >90)
Glucose, Bld: 121 mg/dL — ABNORMAL HIGH (ref 70–99)
Potassium: 4.1 mEq/L (ref 3.7–5.3)
Sodium: 138 mEq/L (ref 137–147)

## 2014-04-07 LAB — GI PATHOGEN PANEL BY PCR, STOOL
C difficile toxin A/B: POSITIVE
CRYPTOSPORIDIUM BY PCR: NEGATIVE
Campylobacter by PCR: NEGATIVE
E coli (ETEC) LT/ST: NEGATIVE
E coli (STEC): NEGATIVE
E coli 0157 by PCR: NEGATIVE
G LAMBLIA BY PCR: NEGATIVE
Norovirus GI/GII: NEGATIVE
ROTAVIRUS A BY PCR: NEGATIVE
Salmonella by PCR: NEGATIVE
Shigella by PCR: NEGATIVE

## 2014-04-07 LAB — URINE CULTURE

## 2014-04-07 LAB — PROCALCITONIN: PROCALCITONIN: 0.82 ng/mL

## 2014-04-07 MED ORDER — FLUCONAZOLE 200 MG PO TABS
200.0000 mg | ORAL_TABLET | Freq: Every day | ORAL | Status: DC
  Administered 2014-04-07 – 2014-04-08 (×2): 200 mg via ORAL
  Filled 2014-04-07 (×3): qty 1

## 2014-04-07 MED ORDER — POTASSIUM CHLORIDE CRYS ER 20 MEQ PO TBCR
40.0000 meq | EXTENDED_RELEASE_TABLET | Freq: Once | ORAL | Status: AC
  Administered 2014-04-07: 40 meq via ORAL
  Filled 2014-04-07: qty 2

## 2014-04-07 NOTE — Progress Notes (Signed)
Per pt request please stop bringing Nepro feeding supplement and Resource Breeze to the room.

## 2014-04-07 NOTE — Progress Notes (Signed)
Patient ID: Adam Walsh  male  ELF:[id number]    DOB: 1941-01-11    DOA: 04/04/2014  PCP: Donnie Coffin, MD  Brief history of present illness  Patient is a 73 year old male with history of ESRD on HD Sun-Mon-Wed-Frid, myasthenia gravis diagnosed 2011, presented with generalized weakness, more than baseline for the past 2 weeks. Patient also complained of chronic back pain since September with radiation of the pain into the bilateral lower extremities, recent ESI on 11/4. Prednisone was increased to 20 mg daily about 2 weeks ago by Dr. Jannifer Franklin. Patient was also treated for oral thrush with Magic mouth wash which is not significantly improving. Also complained of diarrhea in the last 2-3 days with 3-4 bowel movements yesterday with no metaplasia or melena, slight dysuria in the last 2 days. In the ED patient was also found to have pancytopenia with PLT 32K.  C. Difficile was positive, patient was started on IV Flagyl and oral vancomycin. IV Rocephin was discontinued, UA was negative for UTI Neurology following, for myasthenia gravis crisis. Patient had multiple runs of SVT since cardiology consulted, placed on beta blocker.    Assessment/Plan: Principal Problem:   Myasthenia gravis with acute exacerbation - neurology consulted, recommended continuing prednisone. If no significant improvement, neurology will decide on IVIG or plasma pheresis. - PTOT evaluation pending  Active Problems: Abdominal pain with diarrhea/acute diffuse colitis: C. Difficile colitis- diarrhea improving - Patient had an MRI pelvis done on 9/30 which had shown diverticulitis, underwent CT abdomen and pelvis during this admission which shows diffuse colitis -continue IV Flagyl and oral vancomycin  Pancytopenia with thrombocytopenia - status post platelets transfusion, platelets improved to 40,000    End stage renal disease On hemodialysis - nephrology following, patient on hemodialysis    Weakness generalized:  likely due to myasthenia gravis exacerbation, acute C. Difficile colitis - PTOT evaluation pending  Odynophagia due to Oral thrush - Placed on nystatin S+S, increased Diflucan  SVTasymptomatic - EKG shows PACs, placed on Lopressor 25 mg BID, titrate up as needed - Cardiology consulted, recommended Cardizem if no improvement with beta blocker - potassium replaced, magnesium replaced yesterday - Cardiology will consult EP for no significant improvement  DVT Prophylaxis:SCDs  Code Status:full code  Family Communication: no family member at the bedside  Disposition:  Consultants:  Neurology  Nephrology  cardiology  Procedures: none Antibiotics:  IV Rocephin 11/7-11/7  IV Flagyl 11/7>  Oral vanco 11/7  Subjective: Patient seen and examined,feels a lot better today, sitting up in the chair, no diarrhea overnight. The only complaint currently is the throat pain.  Objective: Weight change: 0 kg (0 lb)  Intake/Output Summary (Last 24 hours) at 04/07/14 1023 Last data filed at 04/07/14 1013  Gross per 24 hour  Intake   1462 ml  Output      0 ml  Net   1462 ml   Blood pressure 99/56, pulse 53, temperature 98.4 F (36.9 C), temperature source Oral, resp. rate 16, height 5\' 10"  (1.778 m), weight 78.2 kg (172 lb 6.4 oz), SpO2 98 %.  Physical Exam: General: Alert and awake, oriented x3, NAD. CVS: S1-S2 clear Chest: CTAB Abdomen: soft, NT, ND, normal bowel sounds  Extremities: no c/c/e  Lab Results: Basic Metabolic Panel:  Recent Labs Lab 04/05/14 0503 04/06/14 0530 04/06/14 0740  NA 132*  --  138  K 4.1  --  3.5*  CL 98  --  100  CO2 20  --  26  GLUCOSE 93  --  103*  BUN 42*  --  25*  CREATININE 6.07*  --  4.26*  CALCIUM 8.5  --  8.3*  MG  --  1.9  --    Liver Function Tests:  Recent Labs Lab 04/04/14 1600 04/05/14 0503  AST 31 23  ALT 36 27  ALKPHOS 57 53  BILITOT 0.9 0.8  PROT 5.3* 4.9*  ALBUMIN 2.7* 2.4*   No results for input(s):  LIPASE, AMYLASE in the last 168 hours. No results for input(s): AMMONIA in the last 168 hours. CBC:  Recent Labs Lab 04/05/14 0503 04/06/14 0740  WBC 1.5* 1.6*  NEUTROABS 0.3*  --   HGB 10.0* 9.1*  HCT 30.1* 27.1*  MCV 95.6 96.4  PLT 23* 40*   Cardiac Enzymes:  Recent Labs Lab 04/05/14 0503 04/05/14 0905 04/05/14 1800 04/05/14 2100  CKTOTAL 17  --   --   --   CKMB 1.0  --   --   --   TROPONINI  --  <0.30 <0.30 <0.30   BNP: Invalid input(s): POCBNP CBG: No results for input(s): GLUCAP in the last 168 hours.   Micro Results: Recent Results (from the past 240 hour(s))  Stool culture     Status: None (Preliminary result)   Collection Time: 04/04/14 12:58 AM  Result Value Ref Range Status   Specimen Description STOOL  Final   Special Requests Immunocompromised  Final   Culture   Final    NO SUSPICIOUS COLONIES, CONTINUING TO HOLD Performed at [facility]    Report Status PENDING  Incomplete  Clostridium Difficile by PCR     Status: Abnormal   Collection Time: 04/04/14 11:52 PM  Result Value Ref Range Status   C difficile by pcr POSITIVE (A) NEGATIVE Final    Comment: CRITICAL RESULT CALLED TO, READ BACK BY AND VERIFIED WITH: COLLINS,C RN 04/04/14 1157 [place]   Culture, blood (routine x 2)     Status: None (Preliminary result)   Collection Time: 04/05/14  8:55 AM  Result Value Ref Range Status   Specimen Description BLOOD RIGHT ARM  Final   Special Requests BOTTLES DRAWN AEROBIC ONLY 4CC  Final   Culture  Setup Time   Final    04/05/2014 14:51 Performed at [facility]    Culture   Final           BLOOD CULTURE RECEIVED NO GROWTH TO DATE CULTURE WILL BE HELD FOR 5 DAYS BEFORE ISSUING A FINAL NEGATIVE REPORT Performed at [facility]    Report Status PENDING  Incomplete  Culture, blood (routine x 2)     Status: None (Preliminary result)   Collection Time: 04/05/14  9:05 AM  Result Value Ref Range Status   Specimen Description  BLOOD RIGHT ARM  Final   Special Requests BOTTLES DRAWN AEROBIC ONLY 5CC  Final   Culture  Setup Time   Final    04/05/2014 14:51 Performed at [facility]    Culture   Final           BLOOD CULTURE RECEIVED NO GROWTH TO DATE CULTURE WILL BE HELD FOR 5 DAYS BEFORE ISSUING A FINAL NEGATIVE REPORT Performed at [facility]    Report Status PENDING  Incomplete  Urine culture     Status: None   Collection Time: 04/05/14 11:57 AM  Result Value Ref Range Status   Specimen Description URINE, RANDOM  Final   Special Requests NONE  Final   Culture  Setup  Time   Final    04/05/2014 17:37 Performed at [place]   Final    1,000 COLONIES/ML Performed at [facility]    Culture   Final    INSIGNIFICANT GROWTH Performed at [facility]    Report Status 04/07/2014 FINAL  Final    Studies/Results: Ct Abdomen Pelvis Wo Contrast  04/05/2014   CLINICAL DATA:  All over abdominal pain. Nausea, vomiting, diarrhea.  EXAM: CT ABDOMEN AND PELVIS WITHOUT CONTRAST  TECHNIQUE: Multidetector CT imaging of the abdomen and pelvis was performed following the standard protocol without IV contrast.  COMPARISON:  04/29/2012  FINDINGS: Emphysematous changes in the lung bases with scattered peripheral fibrosis. Coronary artery calcifications.  Surgical absence of the gallbladder. Diffuse fatty infiltration of the pancreas. The unenhanced appearance of the liver, spleen, adrenal glands, abdominal aorta, and inferior vena cava is unremarkable. No retroperitoneal lymphadenopathy. Kidneys are atrophic bilaterally. There is a hypodense mass consistent with enlarged lymph node in the root of the mesenteric, measuring about 3.2 cm diameter. This is enlarged since previous study. Additional scattered mesenteric lymph nodes are also mildly enlarged. While these may be reactive, lymphoma or metastasis is not excluded. There is diffuse thickening of the colon wall with  mild pericolonic infiltration most consistent with colitis. This could represent infectious or inflammatory etiology. Small bowel are decompressed. Stomach is decompressed. Postoperative changes at the EG junction. No free air or free fluid in the abdomen.  Pelvis: Calcification in the prostate gland. Bladder is decompressed. No free or loculated pelvic fluid collections appendix is not identified. No evidence of diverticulitis. Scattered diverticula in the sigmoid colon. Degenerative changes in the lumbar spine. Slight anterior subluxation of L4 on L5 is likely degenerative. No destructive bone lesions appreciated.  IMPRESSION: Diffuse colonic wall thickening with mild pericolonic infiltration consistent with infectious or inflammatory colitis. Enlarged mesenteric lymph nodes, increasing since previous study, with largest node measuring 3.2 cm diameter. This is indeterminate.   Electronically Signed   By: Lucienne Capers M.D.   On: 04/05/2014 02:09   Dg Chest 2 View  04/04/2014   CLINICAL DATA:  Mouth swelling for 2 days. Back pain with history of slipped disc. Weakness.  EXAM: CHEST  2 VIEW  COMPARISON:  02/12/2014  FINDINGS: The right dialysis catheter has been removed. Heart size is mildly enlarged. Surgical clips are identified in the region of the gastroesophageal junction. The lungs are clear. No pulmonary edema.  IMPRESSION: No evidence for acute  abnormality.   Electronically Signed   By: Shon Hale M.D.   On: 04/04/2014 15:30   Mr Brain Wo Contrast  04/04/2014   CLINICAL DATA:  Initial evaluation for in generalized weakness.  EXAM: MRI HEAD WITHOUT CONTRAST  TECHNIQUE: Multiplanar, multiecho pulse sequences of the brain and surrounding structures were obtained without intravenous contrast.  COMPARISON:  Prior CT from 04/16/2013  FINDINGS: Mild diffuse prominence of the CSF containing spaces is compatible with generalized cerebral atrophy, appropriate for age. Minimal small vessel changes present  within the periventricular and deep white matter of both cerebral hemispheres.  No focal parenchymal signal abnormality is identified. No mass lesion, midline shift, or extra-axial fluid collection. Ventricles are normal in size without evidence of hydrocephalus.  No diffusion-weighted signal abnormality is identified to suggest acute intracranial infarct. Gray-white matter differentiation is maintained. Normal flow voids are seen within the intracranial vasculature. No intracranial hemorrhage identified.  The cervicomedullary junction is normal. Incidental note made  of a partially empty sella. Pituitary stalk is midline. The globes and optic nerves demonstrate a normal appearance with normal signal intensity. The  The bone marrow signal intensity is normal. Calvarium is intact. Visualized upper cervical spine is within normal limits.  Benign lipoma noted within the right suboccipital region. Scalp soft tissues are otherwise unremarkable.  Paranasal sinuses are clear.  No mastoid effusion.  IMPRESSION: 1. No acute intracranial infarct or other abnormality identified. 2. Mild atrophy with chronic small vessel ischemic disease.   Electronically Signed   By: Jeannine Boga M.D.   On: 04/04/2014 21:57   Dg Shoulder Left  03/25/2014   CLINICAL DATA:  Progressive loss of range of motion of the left shoulder. No known injury.  EXAM: LEFT SHOULDER - 2+ VIEW  COMPARISON:  None.  FINDINGS: Mild AC joint degenerative changes. No acute bony findings. The visualized left lung is clear.  IMPRESSION: No acute bony findings or significant degenerative changes.   Electronically Signed   By: Kalman Jewels M.D.   On: 03/25/2014 17:08    Medications: Scheduled Meds: . calcium acetate  1,334 mg Oral TID WC  . clotrimazole  10 mg Oral 5 X Daily  . darbepoetin (ARANESP) injection - DIALYSIS  100 mcg Intravenous Q Sat-HD  . feeding supplement (NEPRO CARB STEADY)  237 mL Oral BID BM  . feeding supplement (RESOURCE  BREEZE)  1 Container Oral BID BM  . fluconazole  200 mg Oral Daily  . folic acid  1 mg Oral Daily  . gabapentin  300 mg Oral QHS  . metoprolol tartrate  25 mg Oral BID  . metronidazole  500 mg Intravenous Q8H  . multivitamin  1 tablet Oral Daily  . nystatin  5 mL Oral QID  . paricalcitol  2 mcg Intravenous Once per day on Mon Wed Fri  . paricalcitol  2 mcg Intravenous Once in dialysis  . predniSONE  20 mg Oral Daily  . sodium chloride  3 mL Intravenous Q12H  . sodium chloride  3 mL Intravenous Q12H  . vancomycin  125 mg Oral 4 times per day      LOS: 3 days   Paulla Mcclaskey M.D. [facility] 04/07/2014, 10:23 AM Pager: [phone]  If 7PM-7AM, please contact night-coverage [email or web address] Password [email or web address]

## 2014-04-07 NOTE — Progress Notes (Signed)
Pt refused bed alarm 

## 2014-04-07 NOTE — Progress Notes (Signed)
Patient Name: Adam Walsh Date of Encounter: 04/07/2014   Principal Problem:   Myasthenia gravis with acute exacerbation Active Problems:   Hypertension   End stage renal disease   Weakness generalized   Pancytopenia   Weakness   Diarrhea   Colitis   PSVT (paroxysmal supraventricular tachycardia)   SUBJECTIVE  Had more SVT yesterday morning but none since ~ noon yesterday.  Strength improving.  No c/p or sob.  Due for dialysis today.    CURRENT MEDS . calcium acetate  1,334 mg Oral TID WC  . clotrimazole  10 mg Oral 5 X Daily  . darbepoetin (ARANESP) injection - DIALYSIS  100 mcg Intravenous Q Sat-HD  . feeding supplement (NEPRO CARB STEADY)  237 mL Oral BID BM  . feeding supplement (RESOURCE BREEZE)  1 Container Oral BID BM  . fluconazole  100 mg Oral Daily  . folic acid  1 mg Oral Daily  . gabapentin  300 mg Oral QHS  . metoprolol tartrate  25 mg Oral BID  . metronidazole  500 mg Intravenous Q8H  . multivitamin  1 tablet Oral Daily  . nystatin  5 mL Oral QID  . paricalcitol  2 mcg Intravenous Once per day on Mon Wed Fri  . paricalcitol  2 mcg Intravenous Once in dialysis  . predniSONE  20 mg Oral Daily  . sodium chloride  3 mL Intravenous Q12H  . sodium chloride  3 mL Intravenous Q12H  . vancomycin  125 mg Oral 4 times per day    OBJECTIVE  Filed Vitals:   04/06/14 1308 04/06/14 1626 04/06/14 2054 04/07/14 0440  BP: 107/73 93/49 119/55 99/56  Pulse: 161 65 63 53  Temp:  99.3 F (37.4 C) 98.7 F (37.1 C) 98.4 F (36.9 C)  TempSrc:  Oral Oral Oral  Resp:   16 16  Height:      Weight:   172 lb 6.4 oz (78.2 kg)   SpO2:  98% 98% 98%    Intake/Output Summary (Last 24 hours) at 04/07/14 0843 Last data filed at 04/07/14 0700  Gross per 24 hour  Intake   1939 ml  Output      0 ml  Net   1939 ml   Filed Weights   04/05/14 2212 04/05/14 2333 04/06/14 2054  Weight: 172 lb 6.4 oz (78.2 kg) 172 lb 6.4 oz (78.2 kg) 172 lb 6.4 oz (78.2 kg)    PHYSICAL  EXAM  General: Pleasant, NAD. Neuro: Alert and oriented X 3. Moves all extremities spontaneously. Psych: Normal affect. HEENT:  Normal  Neck: Supple without bruits or JVD. Lungs:  Resp regular and unlabored, CTA. Heart: RRR no s3, s4, or murmurs. Abdomen: Soft, non-tender, non-distended, BS + x 4.  Extremities: No clubbing, cyanosis or edema. DP/PT/Radials 2+ and equal bilaterally.  Accessory Clinical Findings  CBC  Recent Labs  04/04/14 2200 04/05/14 0503 04/06/14 0740  WBC 1.3* 1.5* 1.6*  NEUTROABS 0.3* 0.3*  --   HGB 10.3* 10.0* 9.1*  HCT 30.9* 30.1* 27.1*  MCV 95.1 95.6 96.4  PLT 32* 23* 40*   Basic Metabolic Panel  Recent Labs  04/05/14 0503 04/06/14 0530 04/06/14 0740  NA 132*  --  138  K 4.1  --  3.5*  CL 98  --  100  CO2 20  --  26  GLUCOSE 93  --  103*  BUN 42*  --  25*  CREATININE 6.07*  --  4.26*  CALCIUM 8.5  --  8.3*  MG  --  1.9  --    Liver Function Tests  Recent Labs  04/04/14 1600 04/05/14 0503  AST 31 23  ALT 36 27  ALKPHOS 57 53  BILITOT 0.9 0.8  PROT 5.3* 4.9*  ALBUMIN 2.7* 2.4*   Cardiac Enzymes  Recent Labs  04/05/14 0503 04/05/14 0905 04/05/14 1800 04/05/14 2100  CKTOTAL 17  --   --   --   CKMB 1.0  --   --   --   TROPONINI  --  <0.30 <0.30 <0.30   Thyroid Function Tests  Recent Labs  04/04/14 2200  TSH 3.220    TELE  SB-RSR overnight.  Radiology/Studies  Ct Abdomen Pelvis Wo Contrast  04/05/2014   CLINICAL DATA:  All over abdominal pain. Nausea, vomiting, diarrhea.  EXAM: CT ABDOMEN AND PELVIS WITHOUT CONTRAST  IMPRESSION: Diffuse colonic wall thickening with mild pericolonic infiltration consistent with infectious or inflammatory colitis. Enlarged mesenteric lymph nodes, increasing since previous study, with largest node measuring 3.2 cm diameter. This is indeterminate.   Electronically Signed   By: Lucienne Capers M.D.   On: 04/05/2014 02:09   Dg Chest 2 View  04/04/2014   CLINICAL DATA:  Mouth swelling  for 2 days. Back pain with history of slipped disc. Weakness.  EXAM: CHEST  2 VIEW  COMPARISON:  02/12/2014  FINDINGS: The right dialysis catheter has been removed. Heart size is mildly enlarged. Surgical clips are identified in the region of the gastroesophageal junction. The lungs are clear. No pulmonary edema.  IMPRESSION: No evidence for acute  abnormality.   Electronically Signed   By: Shon Hale M.D.   On: 04/04/2014 15:30   Mr Brain Wo Contrast  04/04/2014   CLINICAL DATA:  Initial evaluation for in generalized weakness.  EXAM: MRI HEAD WITHOUT CONTRAST   IMPRESSION: 1. No acute intracranial infarct or other abnormality identified. 2. Mild atrophy with chronic small vessel ischemic disease.   Electronically Signed   By: Jeannine Boga M.D.   On: 04/04/2014 21:57   ASSESSMENT AND PLAN  1.  PSVT:  Pt has a h/o PSVT and has noted occasional bouts of tachycardia during dialysis sessions.  He says that he was previously only taking metoprolol prn for tachycardia but that he is usually asymptomatic and thus wasn't taking it regularly.  He is now on lopressor 25mg  bid.  Resting HR's in 50's to 60's with resting BP's in 90's to low 100's.  Cont current dose of BB.  Don't think they'll be room to titrate further or to add dilt as was previously discussed.  If frequent paroxysms despite scheduled BB therapy, would ask EP to see for consideration of ablation.  2.  Weakness/Myasthenia Gravis:  Per IM/neuro.  Strength somewhat better.  3.  Pancytopenia:  Chronic/stable.  4.  Thrush:  Antifungals per IM.  5.  UTI:  Rocephin per IM.  6.  ESRD:  Per nephrology.  7.  HTN:  BP soft/stable.  Follow on bb.   Signed, Murray Hodgkins NP  Patient examined chart reviewed agree with low dose beta blocker Getting dialysis with stable rhythm EP evaluation of frequent bouts of SVT complicate dialysis   Jenkins Rouge

## 2014-04-07 NOTE — Care Management Note (Signed)
CARE MANAGEMENT NOTE 04/07/2014  Patient:  Adam Walsh, Adam Walsh   Account Number:  [id number]  Date Initiated:  04/07/2014  Documentation initiated by:  Austan Nicholl  Subjective/Objective Assessment:   CM following for progression and d/c planning     Action/Plan:   Anticipated DC Date:     Anticipated DC Plan:  [place]         Choice offered to / List presented to:             Status of service:   Medicare Important Message given?  YES (If response is "NO", the following Medicare IM given date fields will be blank) Date Medicare IM given:  04/07/2014 Medicare IM given by:  Avik Leoni Date Additional Medicare IM given:   Additional Medicare IM given by:    Discharge Disposition:    Per UR Regulation:    If discussed at Long Length of Stay Meetings, dates discussed:    Comments:

## 2014-04-07 NOTE — Progress Notes (Signed)
Subjective:  Feels better/no diarrhea this am/ HD today  Objective Vital signs in last 24 hours: Filed Vitals:   04/06/14 1308 04/06/14 1626 04/06/14 2054 04/07/14 0440  BP: 107/73 93/49 119/55 99/56  Pulse: 161 65 63 53  Temp:  99.3 F (37.4 C) 98.7 F (37.1 C) 98.4 F (36.9 C)  TempSrc:  Oral Oral Oral  Resp:   16 16  Height:      Weight:   78.2 kg (172 lb 6.4 oz)   SpO2:  98% 98% 98%   Physical Exam: General: Alert,more animated, NAD , sitting in chair next to window Heart: RRR, no mur, gallop ,or rub Lungs: CTA bilat  Abdomen: bs pos. ,soft, nontender, nondistended Extremities:no pedal edema Dialysis Access: pos bruit L UA AVF  Dialysis Orders: Home HD using NxStage with PureFlow 4X Week, MonWedFriSun, CAR 172, Therapy Fluid 1.0 K 45 Lactate, Volume per Tx 25 (liters), Flow Fraction 45 %, BFR 400, EDW 84 NO heparin Epogen-8000/wk down from 11 K in Sept Paricalcititol 2 mcg 3x week Recent labs: Hgb 11 10/12 tsat 23% and ferritin 1660 11/1 iPTH 314 10/1  Assessment/Plan: 1. Weakness - multifactorial, Cdif colitis, painful thrush, back pain, MG 2. ESRD - On home HD MWFSUN-  For HD today  MWF in hosp- no heparin - 3. Thrush - chlotrimazole troches and nystatin/diflucan 4. PSVT - Cardiology seeing On Metoprolol 25 mg bidHrt rate in 50s'  Asymp.?Ep To see 5. Pancytopenia - WBC and platelets very low - Hgb only slight lower than most recent outpt Hgb- plan platelet transfusion Pt's wife says he was just started on methotrexate for RA and had just taken a few doses 6. Diarrhea - CT abdomen c/w infectious vs inflammatory colitis - on metronidzazole 7. Hypertension/volume - bp 99/56 with CXR on admit= NAD/ outpt prior edw 84 but he hasn'Walsh been eating well;WT post HD 78.2 kg/ On Metoprolol 25mg  bid with PSVT monitor bp With HD but no excess vol uf needed . 8. Anemia - Hgb 10.0 , Epogen 8000 weekly;given in hosp Aranesp 100 q Sat HD 9. Metabolic bone disease - Continue  zemplar, calcium acetate 2 ac 10. Back pain --per primary 11. Nutrition - ALB2.4, intake poor due to thrush - on gluten free diet 12. Celiac sprue - with gluten allergy - RD to see due to poor nutrition, thrush and gluten allergy. 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 HX MSSA bacteremia and cellulitis/abscess AVF 01/2014 - repeat BC pending 15. Myasthenia Gravis   Ernest Haber, PA-C [place] [phone] 04/07/2014,8:11 AM  LOS: 3 days  I have seen and examined this patient and agree with plan per Ernest Haber.  Pt seen on HD and will change to 4K bath and plan HD for 4 hrs.  Diarrhea and mouth pain improving. Adam Chuang T,MD 04/07/2014 12:06 PM Labs: Basic Metabolic Panel:  Recent Labs Lab 04/04/14 1600 04/05/14 0503 04/06/14 0740  NA 136* 132* 138  K 4.5 4.1 3.5*  CL 98 98 100  CO2 22 20 26   GLUCOSE 135* 93 103*  BUN 40* 42* 25*  CREATININE 5.40* 6.07* 4.26*  CALCIUM 8.9 8.5 8.3*   Liver Function Tests:  Recent Labs Lab 04/04/14 1600 04/05/14 0503  AST 31 23  ALT 36 27  ALKPHOS 57 53  BILITOT 0.9 0.8  PROT 5.3* 4.9*  ALBUMIN 2.7* 2.4*  CBC:  Recent Labs Lab 04/04/14 1600 04/04/14 2200 04/05/14 0503 04/06/14 0740  WBC 1.1* 1.3* 1.5* 1.6*  NEUTROABS 0.6* 0.3* 0.3*  --   HGB 10.5* 10.3* 10.0* 9.1*  HCT 31.4* 30.9* 30.1* 27.1*  MCV 95.2 95.1 95.6 96.4  PLT 35* 32* 23* 40*   Cardiac Enzymes:  Recent Labs Lab 04/05/14 0503 04/05/14 0905 04/05/14 1800 04/05/14 2100  CKTOTAL 17  --   --   --   CKMB 1.0  --   --   --   TROPONINI  --  <0.30 <0.30 <0.30  Medications:   . calcium acetate  1,334 mg Oral TID WC  . clotrimazole  10 mg Oral 5 X Daily  . darbepoetin (ARANESP) injection - DIALYSIS  100 mcg Intravenous Q Sat-HD  . feeding supplement (NEPRO CARB STEADY)  237 mL Oral BID BM  . feeding supplement (RESOURCE BREEZE)  1 Container Oral BID BM  . fluconazole  100 mg Oral Daily  . folic acid  1 mg  Oral Daily  . gabapentin  300 mg Oral QHS  . metoprolol tartrate  25 mg Oral BID  . metronidazole  500 mg Intravenous Q8H  . multivitamin  1 tablet Oral Daily  . nystatin  5 mL Oral QID  . paricalcitol  2 mcg Intravenous Once per day on Mon Wed Fri  . paricalcitol  2 mcg Intravenous Once in dialysis  . predniSONE  20 mg Oral Daily  . sodium chloride  3 mL Intravenous Q12H  . sodium chloride  3 mL Intravenous Q12H  . vancomycin  125 mg Oral 4 times per day

## 2014-04-08 LAB — BASIC METABOLIC PANEL
ANION GAP: 11 (ref 5–15)
BUN: 15 mg/dL (ref 6–23)
CALCIUM: 8.3 mg/dL — AB (ref 8.4–10.5)
CO2: 26 mEq/L (ref 19–32)
Chloride: 102 mEq/L (ref 96–112)
Creatinine, Ser: 3.5 mg/dL — ABNORMAL HIGH (ref 0.50–1.35)
GFR calc non Af Amer: 16 mL/min — ABNORMAL LOW (ref >90)
GFR, EST AFRICAN AMERICAN: 19 mL/min — AB (ref >90)
Glucose, Bld: 130 mg/dL — ABNORMAL HIGH (ref 70–99)
Potassium: 5.2 mEq/L (ref 3.7–5.3)
Sodium: 139 mEq/L (ref 137–147)

## 2014-04-08 LAB — STOOL CULTURE

## 2014-04-08 LAB — CBC
HCT: 25.7 % — ABNORMAL LOW (ref 39.0–52.0)
Hemoglobin: 8.6 g/dL — ABNORMAL LOW (ref 13.0–17.0)
MCH: 32 pg (ref 26.0–34.0)
MCHC: 33.5 g/dL (ref 30.0–36.0)
MCV: 95.5 fL (ref 78.0–100.0)
PLATELETS: 24 10*3/uL — AB (ref 150–400)
RBC: 2.69 MIL/uL — ABNORMAL LOW (ref 4.22–5.81)
RDW: 16 % — AB (ref 11.5–15.5)
WBC: 0.9 10*3/uL — AB (ref 4.0–10.5)

## 2014-04-08 LAB — PHOSPHORUS: PHOSPHORUS: 2.9 mg/dL (ref 2.3–4.6)

## 2014-04-08 LAB — PATHOLOGIST SMEAR REVIEW

## 2014-04-08 LAB — MAGNESIUM: Magnesium: 2.2 mg/dL (ref 1.5–2.5)

## 2014-04-08 MED ORDER — FLUCONAZOLE IN SODIUM CHLORIDE 200-0.9 MG/100ML-% IV SOLN
200.0000 mg | INTRAVENOUS | Status: DC
  Administered 2014-04-08 – 2014-04-09 (×2): 200 mg via INTRAVENOUS
  Filled 2014-04-08 (×3): qty 100

## 2014-04-08 MED ORDER — DARBEPOETIN ALFA 40 MCG/0.4ML IJ SOSY
40.0000 ug | PREFILLED_SYRINGE | INTRAMUSCULAR | Status: DC
  Filled 2014-04-08: qty 0.4

## 2014-04-08 MED ORDER — MAGIC MOUTHWASH W/LIDOCAINE
5.0000 mL | Freq: Three times a day (TID) | ORAL | Status: DC
  Administered 2014-04-08 – 2014-04-10 (×9): 5 mL via ORAL
  Filled 2014-04-08 (×12): qty 5

## 2014-04-08 NOTE — Plan of Care (Signed)
Problem: Phase I Progression Outcomes Goal: Pain controlled with appropriate interventions Outcome: Completed/Met Date Met:  04/08/14

## 2014-04-08 NOTE — Progress Notes (Signed)
Utilization review completed.  

## 2014-04-08 NOTE — Progress Notes (Signed)
Subjective: Patient with multiple complaints but feels that none are related to his MG.    Objective: Current vital signs: BP 109/55 mmHg  Pulse 60  Temp(Src) 99.8 F (37.7 C) (Oral)  Resp 16  Ht 5\' 10"  (1.778 m)  Wt 76 kg (167 lb 8.8 oz)  BMI 24.04 kg/m2  SpO2 100% Vital signs in last 24 hours: Temp:  [97.6 F (36.4 C)-99.9 F (37.7 C)] 99.8 F (37.7 C) (11/10 0927) Pulse Rate:  [55-79] 60 (11/10 0927) Resp:  [11-23] 16 (11/10 0927) BP: (94-120)/(45-83) 109/55 mmHg (11/10 0927) SpO2:  [99 %-100 %] 100 % (11/10 0927) Weight:  [76 kg (167 lb 8.8 oz)] 76 kg (167 lb 8.8 oz) (11/09 1552)  Intake/Output from previous day: 11/09 0701 - 11/10 0700 In: 3 [I.V.:3] Out: 768  Intake/Output this shift: Total I/O In: 480 [P.O.:480] Out: -  Nutritional status: Diet gluten free  Neurologic Exam: Mental Status: Alert, oriented, thought content appropriate.  Speech fluent without evidence of aphasia.  Able to follow 3 step commands without difficulty. Cranial Nerves: II: Discs flat bilaterally; Visual fields grossly normal, pupils equal, round, reactive to light and accommodation III,IV, VI: ptosis not present, extra-ocular motions intact bilaterally V,VII: smile symmetric, facial light touch sensation normal bilaterally VIII: hearing normal bilaterally IX,X: gag reflex present XI: bilateral shoulder shrug XII: midline tongue extension Motor: Patient able to lift all extremities against gravity except the LUE.  Once arm lifted actively to 90 degrees patient can lift the arm above head and maintain.  Sensory: Pinprick and light touch intact throughout, bilaterally Deep Tendon Reflexes: 2+ and symmetric throughout Plantars: Right: downgoing   Left: downgoing Cerebellar: normal finger-to-nose, normal rapid alternating movements and normal heel-to-shin test Gait: normal gait and station CV: pulses palpable throughout     Lab Results: Basic Metabolic Panel:  Recent Labs Lab  04/04/14 1600 04/05/14 0503 04/06/14 0530 04/06/14 0740 04/07/14 0900 04/08/14 0322  NA 136* 132*  --  138 138 139  K 4.5 4.1  --  3.5* 4.1 5.2  CL 98 98  --  100 98 102  CO2 22 20  --  26 23 26   GLUCOSE 135* 93  --  103* 121* 130*  BUN 40* 42*  --  25* 37* 15  CREATININE 5.40* 6.07*  --  4.26* 5.94* 3.50*  CALCIUM 8.9 8.5  --  8.3* 9.0 8.3*  MG  --   --  1.9  --   --  2.2  PHOS  --   --   --   --   --  2.9    Liver Function Tests:  Recent Labs Lab 04/04/14 1600 04/05/14 0503  AST 31 23  ALT 36 27  ALKPHOS 57 53  BILITOT 0.9 0.8  PROT 5.3* 4.9*  ALBUMIN 2.7* 2.4*   No results for input(s): LIPASE, AMYLASE in the last 168 hours. No results for input(s): AMMONIA in the last 168 hours.  CBC:  Recent Labs Lab 04/04/14 1600 04/04/14 2200 04/05/14 0503 04/06/14 0740 04/07/14 0900 04/08/14 0322  WBC 1.1* 1.3* 1.5* 1.6* 1.6* 0.9*  NEUTROABS 0.6* 0.3* 0.3*  --   --   --   HGB 10.5* 10.3* 10.0* 9.1* 9.3* 8.6*  HCT 31.4* 30.9* 30.1* 27.1* 27.4* 25.7*  MCV 95.2 95.1 95.6 96.4 93.5 95.5  PLT 35* 32* 23* 40* 33* 24*    Cardiac Enzymes:  Recent Labs Lab 04/05/14 0503 04/05/14 0905 04/05/14 1800 04/05/14 2100  CKTOTAL 17  --   --   --  CKMB 1.0  --   --   --   TROPONINI  --  <0.30 <0.30 <0.30    Lipid Panel: No results for input(s): CHOL, TRIG, HDL, CHOLHDL, VLDL, LDLCALC in the last 168 hours.  CBG: No results for input(s): GLUCAP in the last 168 hours.  Microbiology: Results for orders placed or performed during the hospital encounter of 04/04/14  Stool culture     Status: None   Collection Time: 04/04/14 12:58 AM  Result Value Ref Range Status   Specimen Description STOOL  Final   Special Requests Immunocompromised  Final   Culture   Final    NO SALMONELLA, SHIGELLA, CAMPYLOBACTER, YERSINIA, OR E.COLI 0157:H7 ISOLATED Performed at [facility]    Report Status 04/08/2014 FINAL  Final  Clostridium Difficile by PCR     Status: Abnormal    Collection Time: 04/04/14 11:52 PM  Result Value Ref Range Status   C difficile by pcr POSITIVE (A) NEGATIVE Final    Comment: CRITICAL RESULT CALLED TO, READ BACK BY AND VERIFIED WITH: COLLINS,C RN 04/04/14 1157 [place]   Culture, blood (routine x 2)     Status: None (Preliminary result)   Collection Time: 04/05/14  8:55 AM  Result Value Ref Range Status   Specimen Description BLOOD RIGHT ARM  Final   Special Requests BOTTLES DRAWN AEROBIC ONLY 4CC  Final   Culture  Setup Time   Final    04/05/2014 14:51 Performed at [facility]    Culture   Final           BLOOD CULTURE RECEIVED NO GROWTH TO DATE CULTURE WILL BE HELD FOR 5 DAYS BEFORE ISSUING A FINAL NEGATIVE REPORT Performed at [facility]    Report Status PENDING  Incomplete  Culture, blood (routine x 2)     Status: None (Preliminary result)   Collection Time: 04/05/14  9:05 AM  Result Value Ref Range Status   Specimen Description BLOOD RIGHT ARM  Final   Special Requests BOTTLES DRAWN AEROBIC ONLY 5CC  Final   Culture  Setup Time   Final    04/05/2014 14:51 Performed at [facility]    Culture   Final           BLOOD CULTURE RECEIVED NO GROWTH TO DATE CULTURE WILL BE HELD FOR 5 DAYS BEFORE ISSUING A FINAL NEGATIVE REPORT Performed at [facility]    Report Status PENDING  Incomplete  Urine culture     Status: None   Collection Time: 04/05/14 11:57 AM  Result Value Ref Range Status   Specimen Description URINE, RANDOM  Final   Special Requests NONE  Final   Culture  Setup Time   Final    04/05/2014 17:37 Performed at [place]   Final    1,000 COLONIES/ML Performed at [facility]    Culture   Final    INSIGNIFICANT GROWTH Performed at [facility]    Report Status 04/07/2014 FINAL  Final    Coagulation Studies: No results for input(s): LABPROT, INR in the last 72 hours.  Imaging: No results found.  Medications:  I have  reviewed the patient's current medications. Scheduled: . calcium acetate  1,334 mg Oral TID WC  . clotrimazole  10 mg Oral 5 X Daily  . [START ON 04/09/2014] darbepoetin (ARANESP) injection - DIALYSIS  40 mcg Intravenous Q Wed-HD  . feeding supplement (NEPRO CARB STEADY)  237 mL  Oral BID BM  . feeding supplement (RESOURCE BREEZE)  1 Container Oral BID BM  . fluconazole (DIFLUCAN) IV  200 mg Intravenous Q24H  . folic acid  1 mg Oral Daily  . gabapentin  300 mg Oral QHS  . magic mouthwash w/lidocaine  5 mL Oral TID AC & HS  . metoprolol tartrate  25 mg Oral BID  . metronidazole  500 mg Intravenous Q8H  . multivitamin  1 tablet Oral Daily  . nystatin  5 mL Oral QID  . paricalcitol  2 mcg Intravenous Once per day on Mon Wed Fri  . paricalcitol  2 mcg Intravenous Once in dialysis  . predniSONE  20 mg Oral Daily  . sodium chloride  3 mL Intravenous Q12H  . sodium chloride  3 mL Intravenous Q12H  . vancomycin  125 mg Oral 4 times per day    Assessment/Plan: Patient with MG.  No bulbar signs at this time.  LUE weakness predated recent illness.  Unclear if this is of cervical etiology.    Recommendations: 1.  CT of the cervical spine.   2.  No TPE indicated at this time.     LOS: 4 days   Alexis Goodell, MD [facility] [phone] 04/08/2014  2:40 PM

## 2014-04-08 NOTE — Progress Notes (Signed)
Patient is placed on neutropenic precautions.

## 2014-04-08 NOTE — Progress Notes (Signed)
Subjective: No diarrhea this am / continued mouth and stomach discomfort/ HD yest  Signed off 45min  "my back can't take 4 hr will try as best as can"  Objective Vital signs in last 24 hours: Filed Vitals:   04/07/14 1552 04/07/14 1700 04/07/14 2013 04/08/14 0516  BP: 116/56 102/83 94/47 96/58   Pulse: 79 76 72 70  Temp: 97.6 F (36.4 C)  99.5 F (37.5 C) 99.9 F (37.7 C)  TempSrc: Oral     Resp: 23  17 16   Height:      Weight: 76 kg (167 lb 8.8 oz)     SpO2:   99% 99%   Weight change: -1.3 kg (-2 lb 13.9 oz) Physical Exam: General: Alert,NAD , Again sitting in chair next to window/ "waiting for Breakfast" Heart: RRR, no mur, gallop ,or rub Lungs: CTA bilat  Abdomen: bs pos. ,soft, nontender, nondistended Extremities:no pedal edema Dialysis Access: pos bruit L UA AVF  Dialysis Orders: Home HD using NxStage with PureFlow 4X Week, MonWedFriSun, CAR 172, Therapy Fluid 1.0 K 45 Lactate, Volume per Tx 25 (liters), Flow Fraction 45 %, BFR 400, EDW 84 NO heparin Epogen-8000/wk down from 11 K in Sept Paricalcititol 2 mcg 3x week Recent labs: Hgb 11 10/12 tsat 23% and ferritin 1660 11/1 iPTH 314 10/1  Assessment/Plan: 1. Weakness - Resolving  Walking in Room / multifactorial, Cdif colitis, painful thrush, back pain, MG 2. ESRD - On home HD MWFSUN-In Hosp  HD 4hrs MWF in hosp- no heparin - 3. Thrush - chlotrimazole troches and nystatin/diflucan 4. PSVT - Cardiology seeing On Metoprolol 25 mg bidHrt rate in 50s'- 60 Asymp."Ep To see" if reoccurring  5. Pancytopenia - WBC / platelets  low - Hgb only slight lower than most recent outpt Hgb- had  platelet transfusion Pt's wife says he was just started on methotrexate for RA and had just taken a few doses 6. Diarrhea -  Improving past 48hr/CT abdomen c/w infectious vs inflammatory colitis - on metronidazole/ po Vanco 7. Hypertension/volume - bp 96/58 with CXR on admit= NAD/ outpt prior edw 84 but he hasn't been eating well;WT  post HD 76 kg/ On Metoprolol 25mg  bid with PSVT monitor bp With HD but no excess vol uf needed/ keep even  Next hd . 8. Anemia - Hgb 8.6 , Epogen 8000 weekly;given in hosp Aranesp 100 q Sat HD/ wiil check Fe and change Aranesp to  Wed give 40 tomorrow 9. Metabolic bone disease - Continue zemplar, calcium acetate 2 ac 10. Back pain --per primary 11. Nutrition - ALB2.4, intake poor due to thrush - on gluten free diet 12. Celiac sprue - with gluten allergy - RD to see due to poor nutrition, thrush and gluten allergy. 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 HX MSSA bacteremia and cellulitis/abscess AVF 01/2014 - repeat BC pending 15. Myasthenia Gravis  Ernest Haber, PA-C [place] [phone] 04/08/2014,8:09 AM  LOS: 4 days  I have seen and examined this patient and agree with plan per Ernest Haber.  Diarrhea improving but mouth still sore.  He is eating.  Due to his chronic back pain, he says he cannot tolerate HD x 4hrs but will try his best.  Plan HD tomorrow. Millie Forde T,MD 04/08/2014 9:34 AMLabs: Basic Metabolic Panel:  Recent Labs Lab 04/06/14 0740 04/07/14 0900 04/08/14 0322  NA 138 138 139  K 3.5* 4.1 5.2  CL 100 98 102  CO2 26 23 26   GLUCOSE 103* 121* 130*  BUN 25* 37* 15  CREATININE 4.26* 5.94* 3.50*  CALCIUM 8.3* 9.0 8.3*   Liver Function Tests:  Recent Labs Lab 04/04/14 1600 04/05/14 0503  AST 31 23  ALT 36 27  ALKPHOS 57 53  BILITOT 0.9 0.8  PROT 5.3* 4.9*  ALBUMIN 2.7* 2.4*   No results for input(s): LIPASE, AMYLASE in the last 168 hours. No results for input(s): AMMONIA in the last 168 hours. CBC:  Recent Labs Lab 04/04/14 1600 04/04/14 2200 04/05/14 0503 04/06/14 0740 04/07/14 0900 04/08/14 0322  WBC 1.1* 1.3* 1.5* 1.6* 1.6* 0.9*  NEUTROABS 0.6* 0.3* 0.3*  --   --   --   HGB 10.5* 10.3* 10.0* 9.1* 9.3* 8.6*  HCT 31.4* 30.9* 30.1* 27.1* 27.4* 25.7*  MCV 95.2 95.1 95.6 96.4 93.5 95.5  PLT 35*  32* 23* 40* 33* 24*   Cardiac Enzymes:  Recent Labs Lab 04/05/14 0503 04/05/14 0905 04/05/14 1800 04/05/14 2100  CKTOTAL 17  --   --   --   CKMB 1.0  --   --   --   TROPONINI  --  <0.30 <0.30 <0.30    Studies/Results: No results found. Medications:   . calcium acetate  1,334 mg Oral TID WC  . clotrimazole  10 mg Oral 5 X Daily  . darbepoetin (ARANESP) injection - DIALYSIS  100 mcg Intravenous Q Sat-HD  . feeding supplement (NEPRO CARB STEADY)  237 mL Oral BID BM  . feeding supplement (RESOURCE BREEZE)  1 Container Oral BID BM  . fluconazole  200 mg Oral Daily  . folic acid  1 mg Oral Daily  . gabapentin  300 mg Oral QHS  . metoprolol tartrate  25 mg Oral BID  . metronidazole  500 mg Intravenous Q8H  . multivitamin  1 tablet Oral Daily  . nystatin  5 mL Oral QID  . paricalcitol  2 mcg Intravenous Once per day on Mon Wed Fri  . paricalcitol  2 mcg Intravenous Once in dialysis  . predniSONE  20 mg Oral Daily  . sodium chloride  3 mL Intravenous Q12H  . sodium chloride  3 mL Intravenous Q12H  . vancomycin  125 mg Oral 4 times per day

## 2014-04-08 NOTE — Progress Notes (Addendum)
Pt refused CT of Spine. Murvin Natal, MD and Lamar Blinks NP made aware.

## 2014-04-08 NOTE — Progress Notes (Signed)
Patient ID: Adam Walsh  male  XNA:[id number]    DOB: 04/03/1941    DOA: 04/04/2014  PCP: Donnie Coffin, MD  Brief history of present illness  Patient is a 73 year old male with history of ESRD on HD Sun-Mon-Wed-Frid, myasthenia gravis diagnosed 2011, presented with generalized weakness, more than baseline for the past 2 weeks. Patient also complained of chronic back pain since September with radiation of the pain into the bilateral lower extremities, recent ESI on 11/4. Prednisone was increased to 20 mg daily about 2 weeks ago by Dr. Jannifer Franklin. Patient was also treated for oral thrush with Magic mouth wash which is not significantly improving. Also complained of diarrhea in the last 2-3 days with 3-4 bowel movements yesterday with no metaplasia or melena, slight dysuria in the last 2 days. In the ED patient was also found to have pancytopenia with PLT 32K.  C. Difficile was positive, patient was started on IV Flagyl and oral vancomycin. IV Rocephin was discontinued, UA was negative for UTI Neurology following, for myasthenia gravis crisis. Patient had multiple runs of SVT since cardiology consulted, placed on beta blocker.    Assessment/Plan: Principal Problem:   Myasthenia gravis with acute exacerbation - neurology consulted, recommended continuing prednisone. If no significant improvement, neurology will decide on IVIG or plasma pheresis. - PTOT evaluation still pending  Active Problems: Abdominal pain with diarrhea/acute diffuse colitis: C. Difficile colitis- diarrhea improving - Patient had an MRI pelvis done on 9/30 which had shown diverticulitis, underwent CT abdomen and pelvis during this admission which shows diffuse colitis -continue IV Flagyl and oral vancomycin  Pancytopenia with thrombocytopenia - status post platelets transfusion - platelets again down to 24,000, no active bleeding, may need platelets again, monitor CBC - placed on neutropenic precautions, no empiric  antibiotics unless spiking fevers, currently on antibiotics for C. Difficile colitis    End stage renal disease On hemodialysis - nephrology following, patient on hemodialysis    Weakness generalized: likely due to myasthenia gravis exacerbation, acute C. Difficile colitis - PTOT evaluation pending  Odynophagia due to Oral thrush: still not improving - placed on scheduled nystatin and Magic mouthwash, changed to IV Diflucan, likely not absorbing much due to C. Difficile colitis  SVTasymptomatic - EKG shows PACs, placed on Lopressor 25 mg BID, titrate up as needed - Cardiology consulted, recommended Cardizem if no improvement with beta blocker - potassium replaced, magnesium replaced yesterday - Cardiology will consult EP for no significant improvement  DVT Prophylaxis:SCDs  Code Status:full code  Family Communication: no family member at the bedside  Disposition:  Consultants:  Neurology  Nephrology  cardiology  Procedures: none Antibiotics:  IV Rocephin 11/7-11/7  IV Flagyl 11/7>  Oral vanco 11/7  Diflucan  Subjective: Patient seen and examined,sitting up in the chair, Throat still hurting from oral thrush, per patient hurts to eat and drink  Objective: Weight change: -1.3 kg (-2 lb 13.9 oz)  Intake/Output Summary (Last 24 hours) at 04/08/14 1151 Last data filed at 04/08/14 0918  Gross per 24 hour  Intake    480 ml  Output    768 ml  Net   -288 ml   Blood pressure 109/55, pulse 60, temperature 99.8 F (37.7 C), temperature source Oral, resp. rate 16, height 5\' 10"  (1.778 m), weight 76 kg (167 lb 8.8 oz), SpO2 100 %.  Physical Exam: General: Ax O x3, NAD CVS: S1-S2 clear Chest: CTAB Abdomen: soft, NT, ND, normal bowel sounds  Extremities: no c/c/e  Lab Results:  Basic Metabolic Panel:  Recent Labs Lab 04/07/14 0900 04/08/14 0322  NA 138 139  K 4.1 5.2  CL 98 102  CO2 23 26  GLUCOSE 121* 130*  BUN 37* 15  CREATININE 5.94* 3.50*  CALCIUM  9.0 8.3*  MG  --  2.2  PHOS  --  2.9   Liver Function Tests:  Recent Labs Lab 04/04/14 1600 04/05/14 0503  AST 31 23  ALT 36 27  ALKPHOS 57 53  BILITOT 0.9 0.8  PROT 5.3* 4.9*  ALBUMIN 2.7* 2.4*   No results for input(s): LIPASE, AMYLASE in the last 168 hours. No results for input(s): AMMONIA in the last 168 hours. CBC:  Recent Labs Lab 04/05/14 0503  04/07/14 0900 04/08/14 0322  WBC 1.5*  < > 1.6* 0.9*  NEUTROABS 0.3*  --   --   --   HGB 10.0*  < > 9.3* 8.6*  HCT 30.1*  < > 27.4* 25.7*  MCV 95.6  < > 93.5 95.5  PLT 23*  < > 33* 24*  < > = values in this interval not displayed. Cardiac Enzymes:  Recent Labs Lab 04/05/14 0503 04/05/14 0905 04/05/14 1800 04/05/14 2100  CKTOTAL 17  --   --   --   CKMB 1.0  --   --   --   TROPONINI  --  <0.30 <0.30 <0.30   BNP: Invalid input(s): POCBNP CBG: No results for input(s): GLUCAP in the last 168 hours.   Micro Results: Recent Results (from the past 240 hour(s))  Stool culture     Status: None   Collection Time: 04/04/14 12:58 AM  Result Value Ref Range Status   Specimen Description STOOL  Final   Special Requests Immunocompromised  Final   Culture   Final    NO SALMONELLA, SHIGELLA, CAMPYLOBACTER, YERSINIA, OR E.COLI 0157:H7 ISOLATED Performed at [facility]    Report Status 04/08/2014 FINAL  Final  Clostridium Difficile by PCR     Status: Abnormal   Collection Time: 04/04/14 11:52 PM  Result Value Ref Range Status   C difficile by pcr POSITIVE (A) NEGATIVE Final    Comment: CRITICAL RESULT CALLED TO, READ BACK BY AND VERIFIED WITH: COLLINS,C RN 04/04/14 1157 [place]   Culture, blood (routine x 2)     Status: None (Preliminary result)   Collection Time: 04/05/14  8:55 AM  Result Value Ref Range Status   Specimen Description BLOOD RIGHT ARM  Final   Special Requests BOTTLES DRAWN AEROBIC ONLY 4CC  Final   Culture  Setup Time   Final    04/05/2014 14:51 Performed at [facility]     Culture   Final           BLOOD CULTURE RECEIVED NO GROWTH TO DATE CULTURE WILL BE HELD FOR 5 DAYS BEFORE ISSUING A FINAL NEGATIVE REPORT Performed at [facility]    Report Status PENDING  Incomplete  Culture, blood (routine x 2)     Status: None (Preliminary result)   Collection Time: 04/05/14  9:05 AM  Result Value Ref Range Status   Specimen Description BLOOD RIGHT ARM  Final   Special Requests BOTTLES DRAWN AEROBIC ONLY 5CC  Final   Culture  Setup Time   Final    04/05/2014 14:51 Performed at [facility]    Culture   Final           BLOOD CULTURE RECEIVED NO GROWTH TO DATE CULTURE WILL BE HELD FOR  5 DAYS BEFORE ISSUING A FINAL NEGATIVE REPORT Performed at [facility]    Report Status PENDING  Incomplete  Urine culture     Status: None   Collection Time: 04/05/14 11:57 AM  Result Value Ref Range Status   Specimen Description URINE, RANDOM  Final   Special Requests NONE  Final   Culture  Setup Time   Final    04/05/2014 17:37 Performed at [place]   Final    1,000 COLONIES/ML Performed at [facility]    Culture   Final    INSIGNIFICANT GROWTH Performed at [facility]    Report Status 04/07/2014 FINAL  Final    Studies/Results: Ct Abdomen Pelvis Wo Contrast  04/05/2014   CLINICAL DATA:  All over abdominal pain. Nausea, vomiting, diarrhea.  EXAM: CT ABDOMEN AND PELVIS WITHOUT CONTRAST  TECHNIQUE: Multidetector CT imaging of the abdomen and pelvis was performed following the standard protocol without IV contrast.  COMPARISON:  04/29/2012  FINDINGS: Emphysematous changes in the lung bases with scattered peripheral fibrosis. Coronary artery calcifications.  Surgical absence of the gallbladder. Diffuse fatty infiltration of the pancreas. The unenhanced appearance of the liver, spleen, adrenal glands, abdominal aorta, and inferior vena cava is unremarkable. No retroperitoneal lymphadenopathy. Kidneys are  atrophic bilaterally. There is a hypodense mass consistent with enlarged lymph node in the root of the mesenteric, measuring about 3.2 cm diameter. This is enlarged since previous study. Additional scattered mesenteric lymph nodes are also mildly enlarged. While these may be reactive, lymphoma or metastasis is not excluded. There is diffuse thickening of the colon wall with mild pericolonic infiltration most consistent with colitis. This could represent infectious or inflammatory etiology. Small bowel are decompressed. Stomach is decompressed. Postoperative changes at the EG junction. No free air or free fluid in the abdomen.  Pelvis: Calcification in the prostate gland. Bladder is decompressed. No free or loculated pelvic fluid collections appendix is not identified. No evidence of diverticulitis. Scattered diverticula in the sigmoid colon. Degenerative changes in the lumbar spine. Slight anterior subluxation of L4 on L5 is likely degenerative. No destructive bone lesions appreciated.  IMPRESSION: Diffuse colonic wall thickening with mild pericolonic infiltration consistent with infectious or inflammatory colitis. Enlarged mesenteric lymph nodes, increasing since previous study, with largest node measuring 3.2 cm diameter. This is indeterminate.   Electronically Signed   By: Lucienne Capers M.D.   On: 04/05/2014 02:09   Dg Chest 2 View  04/04/2014   CLINICAL DATA:  Mouth swelling for 2 days. Back pain with history of slipped disc. Weakness.  EXAM: CHEST  2 VIEW  COMPARISON:  02/12/2014  FINDINGS: The right dialysis catheter has been removed. Heart size is mildly enlarged. Surgical clips are identified in the region of the gastroesophageal junction. The lungs are clear. No pulmonary edema.  IMPRESSION: No evidence for acute  abnormality.   Electronically Signed   By: Shon Hale M.D.   On: 04/04/2014 15:30   Mr Brain Wo Contrast  04/04/2014   CLINICAL DATA:  Initial evaluation for in generalized weakness.   EXAM: MRI HEAD WITHOUT CONTRAST  TECHNIQUE: Multiplanar, multiecho pulse sequences of the brain and surrounding structures were obtained without intravenous contrast.  COMPARISON:  Prior CT from 04/16/2013  FINDINGS: Mild diffuse prominence of the CSF containing spaces is compatible with generalized cerebral atrophy, appropriate for age. Minimal small vessel changes present within the periventricular and deep white matter of both cerebral hemispheres.  No  focal parenchymal signal abnormality is identified. No mass lesion, midline shift, or extra-axial fluid collection. Ventricles are normal in size without evidence of hydrocephalus.  No diffusion-weighted signal abnormality is identified to suggest acute intracranial infarct. Gray-white matter differentiation is maintained. Normal flow voids are seen within the intracranial vasculature. No intracranial hemorrhage identified.  The cervicomedullary junction is normal. Incidental note made of a partially empty sella. Pituitary stalk is midline. The globes and optic nerves demonstrate a normal appearance with normal signal intensity. The  The bone marrow signal intensity is normal. Calvarium is intact. Visualized upper cervical spine is within normal limits.  Benign lipoma noted within the right suboccipital region. Scalp soft tissues are otherwise unremarkable.  Paranasal sinuses are clear.  No mastoid effusion.  IMPRESSION: 1. No acute intracranial infarct or other abnormality identified. 2. Mild atrophy with chronic small vessel ischemic disease.   Electronically Signed   By: Jeannine Boga M.D.   On: 04/04/2014 21:57   Dg Shoulder Left  03/25/2014   CLINICAL DATA:  Progressive loss of range of motion of the left shoulder. No known injury.  EXAM: LEFT SHOULDER - 2+ VIEW  COMPARISON:  None.  FINDINGS: Mild AC joint degenerative changes. No acute bony findings. The visualized left lung is clear.  IMPRESSION: No acute bony findings or significant degenerative  changes.   Electronically Signed   By: Kalman Jewels M.D.   On: 03/25/2014 17:08    Medications: Scheduled Meds: . calcium acetate  1,334 mg Oral TID WC  . clotrimazole  10 mg Oral 5 X Daily  . [START ON 04/09/2014] darbepoetin (ARANESP) injection - DIALYSIS  40 mcg Intravenous Q Wed-HD  . feeding supplement (NEPRO CARB STEADY)  237 mL Oral BID BM  . feeding supplement (RESOURCE BREEZE)  1 Container Oral BID BM  . fluconazole (DIFLUCAN) IV  200 mg Intravenous Q24H  . folic acid  1 mg Oral Daily  . gabapentin  300 mg Oral QHS  . magic mouthwash w/lidocaine  5 mL Oral TID AC & HS  . metoprolol tartrate  25 mg Oral BID  . metronidazole  500 mg Intravenous Q8H  . multivitamin  1 tablet Oral Daily  . nystatin  5 mL Oral QID  . paricalcitol  2 mcg Intravenous Once per day on Mon Wed Fri  . paricalcitol  2 mcg Intravenous Once in dialysis  . predniSONE  20 mg Oral Daily  . sodium chloride  3 mL Intravenous Q12H  . sodium chloride  3 mL Intravenous Q12H  . vancomycin  125 mg Oral 4 times per day      LOS: 4 days   Erial Fikes M.D. [facility] 04/08/2014, 11:51 AM Pager: [phone]  If 7PM-7AM, please contact night-coverage [email or web address] Password [email or web address]

## 2014-04-08 NOTE — Progress Notes (Signed)
Patient Name: Adam Walsh Date of Encounter: 04/08/2014     Principal Problem:   Myasthenia gravis with acute exacerbation Active Problems:   Hypertension   End stage renal disease   Weakness generalized   Pancytopenia   Weakness   Diarrhea   Colitis   PSVT (paroxysmal supraventricular tachycardia)    SUBJECTIVE  Denies any chest discomfort or SOB. Angry that it took a hr this morning around 3 am to place IV.  CURRENT MEDS . calcium acetate  1,334 mg Oral TID WC  . clotrimazole  10 mg Oral 5 X Daily  . [START ON 04/09/2014] darbepoetin (ARANESP) injection - DIALYSIS  40 mcg Intravenous Q Wed-HD  . feeding supplement (NEPRO CARB STEADY)  237 mL Oral BID BM  . feeding supplement (RESOURCE BREEZE)  1 Container Oral BID BM  . fluconazole  200 mg Oral Daily  . folic acid  1 mg Oral Daily  . gabapentin  300 mg Oral QHS  . metoprolol tartrate  25 mg Oral BID  . metronidazole  500 mg Intravenous Q8H  . multivitamin  1 tablet Oral Daily  . nystatin  5 mL Oral QID  . paricalcitol  2 mcg Intravenous Once per day on Mon Wed Fri  . paricalcitol  2 mcg Intravenous Once in dialysis  . predniSONE  20 mg Oral Daily  . sodium chloride  3 mL Intravenous Q12H  . sodium chloride  3 mL Intravenous Q12H  . vancomycin  125 mg Oral 4 times per day    OBJECTIVE  Filed Vitals:   04/07/14 1700 04/07/14 2013 04/08/14 0516 04/08/14 0927  BP: 102/83 94/47 96/58  109/55  Pulse: 76 72 70 60  Temp:  99.5 F (37.5 C) 99.9 F (37.7 C) 99.8 F (37.7 C)  TempSrc:    Oral  Resp:  17 16 16   Height:      Weight:      SpO2:  99% 99% 100%    Intake/Output Summary (Last 24 hours) at 04/08/14 0954 Last data filed at 04/08/14 0918  Gross per 24 hour  Intake    483 ml  Output    768 ml  Net   -285 ml   Filed Weights   04/06/14 2054 04/07/14 1212 04/07/14 1552  Weight: 172 lb 6.4 oz (78.2 kg) 169 lb 8.5 oz (76.9 kg) 167 lb 8.8 oz (76 kg)    PHYSICAL EXAM  General: Pleasant,  NAD. Neuro: Alert and oriented X 3. Moves all extremities spontaneously. Psych: Normal affect. HEENT:  Normal  Neck: Supple without bruits or JVD. Lungs:  Resp regular and unlabored, CTA. Heart: RRR no s3, s4, or murmurs. Abdomen: Soft, non-tender, non-distended, BS + x 4.  Extremities: No clubbing, cyanosis or edema. DP/PT/Radials 2+ and equal bilaterally. Bilateral echymosys of arm  Accessory Clinical Findings  CBC  Recent Labs  04/07/14 0900 04/08/14 0322  WBC 1.6* 0.9*  HGB 9.3* 8.6*  HCT 27.4* 25.7*  MCV 93.5 95.5  PLT 33* 24*   Basic Metabolic Panel  Recent Labs  04/06/14 0530  04/07/14 0900 04/08/14 0322  NA  --   < > 138 139  K  --   < > 4.1 5.2  CL  --   < > 98 102  CO2  --   < > 23 26  GLUCOSE  --   < > 121* 130*  BUN  --   < > 37* 15  CREATININE  --   < >  5.94* 3.50*  CALCIUM  --   < > 9.0 8.3*  MG 1.9  --   --  2.2  < > = values in this interval not displayed.  Cardiac Enzymes  Recent Labs  04/05/14 1800 04/05/14 2100  TROPONINI <0.30 <0.30    TELE NSR with HR 50-60s, episode of PSVT around 7:03 to 7:12 pm last night, transient, resolved    ECG  No new EKG  Echocardiogram 02/12/2014  - Left ventricle: There was mild concentric hypertrophy. Systolic function was normal. The estimated ejection fraction was in the range of 55% to 60%. Wall motion was normal; there were no regional wall motion abnormalities. - Aortic valve: No evidence of vegetation. - Aorta: The aorta was normal, not dilated, and non-diseased. - Mitral valve: No evidence of vegetation. - Left atrium: No evidence of thrombus in the atrial cavity or appendage. - Right atrium: No evidence of thrombus in the atrial cavity or appendage. - Atrial septum: There was a patent foramen ovale seen by saline microbubble contrast after provocation with a small amount of right to left shunting. - Tricuspid valve: No evidence of vegetation. - Pulmonic valve: No  evidence of vegetation.  Impressions:  - No evidence of endocarditis.    Radiology/Studies  Ct Abdomen Pelvis Wo Contrast  04/05/2014   CLINICAL DATA:  All over abdominal pain. Nausea, vomiting, diarrhea.  EXAM: CT ABDOMEN AND PELVIS WITHOUT CONTRAST  TECHNIQUE: Multidetector CT imaging of the abdomen and pelvis was performed following the standard protocol without IV contrast.  COMPARISON:  04/29/2012  FINDINGS: Emphysematous changes in the lung bases with scattered peripheral fibrosis. Coronary artery calcifications.  Surgical absence of the gallbladder. Diffuse fatty infiltration of the pancreas. The unenhanced appearance of the liver, spleen, adrenal glands, abdominal aorta, and inferior vena cava is unremarkable. No retroperitoneal lymphadenopathy. Kidneys are atrophic bilaterally. There is a hypodense mass consistent with enlarged lymph node in the root of the mesenteric, measuring about 3.2 cm diameter. This is enlarged since previous study. Additional scattered mesenteric lymph nodes are also mildly enlarged. While these may be reactive, lymphoma or metastasis is not excluded. There is diffuse thickening of the colon wall with mild pericolonic infiltration most consistent with colitis. This could represent infectious or inflammatory etiology. Small bowel are decompressed. Stomach is decompressed. Postoperative changes at the EG junction. No free air or free fluid in the abdomen.  Pelvis: Calcification in the prostate gland. Bladder is decompressed. No free or loculated pelvic fluid collections appendix is not identified. No evidence of diverticulitis. Scattered diverticula in the sigmoid colon. Degenerative changes in the lumbar spine. Slight anterior subluxation of L4 on L5 is likely degenerative. No destructive bone lesions appreciated.  IMPRESSION: Diffuse colonic wall thickening with mild pericolonic infiltration consistent with infectious or inflammatory colitis. Enlarged mesenteric lymph  nodes, increasing since previous study, with largest node measuring 3.2 cm diameter. This is indeterminate.   Electronically Signed   By: Lucienne Capers M.D.   On: 04/05/2014 02:09   Dg Chest 2 View  04/04/2014   CLINICAL DATA:  Mouth swelling for 2 days. Back pain with history of slipped disc. Weakness.  EXAM: CHEST  2 VIEW  COMPARISON:  02/12/2014  FINDINGS: The right dialysis catheter has been removed. Heart size is mildly enlarged. Surgical clips are identified in the region of the gastroesophageal junction. The lungs are clear. No pulmonary edema.  IMPRESSION: No evidence for acute  abnormality.   Electronically Signed   By: Damien Fusi.D.  On: 04/04/2014 15:30   Mr Brain Wo Contrast  04/04/2014   CLINICAL DATA:  Initial evaluation for in generalized weakness.  EXAM: MRI HEAD WITHOUT CONTRAST  TECHNIQUE: Multiplanar, multiecho pulse sequences of the brain and surrounding structures were obtained without intravenous contrast.  COMPARISON:  Prior CT from 04/16/2013  FINDINGS: Mild diffuse prominence of the CSF containing spaces is compatible with generalized cerebral atrophy, appropriate for age. Minimal small vessel changes present within the periventricular and deep white matter of both cerebral hemispheres.  No focal parenchymal signal abnormality is identified. No mass lesion, midline shift, or extra-axial fluid collection. Ventricles are normal in size without evidence of hydrocephalus.  No diffusion-weighted signal abnormality is identified to suggest acute intracranial infarct. Gray-white matter differentiation is maintained. Normal flow voids are seen within the intracranial vasculature. No intracranial hemorrhage identified.  The cervicomedullary junction is normal. Incidental note made of a partially empty sella. Pituitary stalk is midline. The globes and optic nerves demonstrate a normal appearance with normal signal intensity. The  The bone marrow signal intensity is normal. Calvarium is  intact. Visualized upper cervical spine is within normal limits.  Benign lipoma noted within the right suboccipital region. Scalp soft tissues are otherwise unremarkable.  Paranasal sinuses are clear.  No mastoid effusion.  IMPRESSION: 1. No acute intracranial infarct or other abnormality identified. 2. Mild atrophy with chronic small vessel ischemic disease.   Electronically Signed   By: Jeannine Boga M.D.   On: 04/04/2014 21:57   Dg Shoulder Left  03/25/2014   CLINICAL DATA:  Progressive loss of range of motion of the left shoulder. No known injury.  EXAM: LEFT SHOULDER - 2+ VIEW  COMPARISON:  None.  FINDINGS: Mild AC joint degenerative changes. No acute bony findings. The visualized left lung is clear.  IMPRESSION: No acute bony findings or significant degenerative changes.   Electronically Signed   By: Kalman Jewels M.D.   On: 03/25/2014 17:08    ASSESSMENT AND PLAN  1. PSVT: h/o PSVT, occasional episodes of tachycardia during dialysis  - continue metoprolol 25mg  BID, unable to uptitrate due to hypotension  - EP to evaluate if continue to have frequent episode PSVT  - currently stable, did have 2 episode last night around 7:03 and 7:12 pm, however appear to be short lasting.   2. Weakness/Myasthenia Gravis: improving  3. Pancytopenia  4. Thrush  5. UTI  6. ESRD on HD MWF 7. HTN  Signed, Woodward Ku Pager: [phone]  Patient examined chart reviewed  No further recommendations at this time continue beta blocker Rx  Will sign off  Jenkins Rouge

## 2014-04-08 NOTE — Progress Notes (Signed)
Pt WBC 0.9. MD notified. Will continue to monitor pt.

## 2014-04-09 DIAGNOSIS — R29898 Other symptoms and signs involving the musculoskeletal system: Secondary | ICD-10-CM | POA: Diagnosis present

## 2014-04-09 LAB — RENAL FUNCTION PANEL
ALBUMIN: 2.5 g/dL — AB (ref 3.5–5.2)
Anion gap: 11 (ref 5–15)
BUN: 28 mg/dL — AB (ref 6–23)
CALCIUM: 8.9 mg/dL (ref 8.4–10.5)
CO2: 24 mEq/L (ref 19–32)
CREATININE: 5.47 mg/dL — AB (ref 0.50–1.35)
Chloride: 104 mEq/L (ref 96–112)
GFR calc Af Amer: 11 mL/min — ABNORMAL LOW (ref >90)
GFR calc non Af Amer: 10 mL/min — ABNORMAL LOW (ref >90)
GLUCOSE: 110 mg/dL — AB (ref 70–99)
PHOSPHORUS: 3.1 mg/dL (ref 2.3–4.6)
Potassium: 4.8 mEq/L (ref 3.7–5.3)
Sodium: 139 mEq/L (ref 137–147)

## 2014-04-09 LAB — CBC
HCT: 25.9 % — ABNORMAL LOW (ref 39.0–52.0)
HEMOGLOBIN: 8.4 g/dL — AB (ref 13.0–17.0)
MCH: 31.7 pg (ref 26.0–34.0)
MCHC: 32.4 g/dL (ref 30.0–36.0)
MCV: 97.7 fL (ref 78.0–100.0)
Platelets: 13 10*3/uL — CL (ref 150–400)
RBC: 2.65 MIL/uL — ABNORMAL LOW (ref 4.22–5.81)
RDW: 16.1 % — ABNORMAL HIGH (ref 11.5–15.5)
WBC: 1 10*3/uL — AB (ref 4.0–10.5)

## 2014-04-09 LAB — BASIC METABOLIC PANEL
Anion gap: 12 (ref 5–15)
BUN: 27 mg/dL — AB (ref 6–23)
CHLORIDE: 103 meq/L (ref 96–112)
CO2: 24 mEq/L (ref 19–32)
Calcium: 9.1 mg/dL (ref 8.4–10.5)
Creatinine, Ser: 5.35 mg/dL — ABNORMAL HIGH (ref 0.50–1.35)
GFR calc Af Amer: 11 mL/min — ABNORMAL LOW (ref >90)
GFR, EST NON AFRICAN AMERICAN: 10 mL/min — AB (ref >90)
GLUCOSE: 108 mg/dL — AB (ref 70–99)
POTASSIUM: 4.7 meq/L (ref 3.7–5.3)
SODIUM: 139 meq/L (ref 137–147)

## 2014-04-09 LAB — IRON AND TIBC
Iron: 70 ug/dL (ref 42–135)
Saturation Ratios: 72 % — ABNORMAL HIGH (ref 20–55)
TIBC: 97 ug/dL — ABNORMAL LOW (ref 215–435)
UIBC: 27 ug/dL — AB (ref 125–400)

## 2014-04-09 LAB — PROCALCITONIN: PROCALCITONIN: 0.55 ng/mL

## 2014-04-09 LAB — HEPATITIS B SURFACE ANTIGEN: HEP B S AG: NEGATIVE

## 2014-04-09 MED ORDER — HYDROCODONE-ACETAMINOPHEN 5-325 MG PO TABS
ORAL_TABLET | ORAL | Status: AC
Start: 2014-04-09 — End: 2014-04-09
  Administered 2014-04-09: 2
  Filled 2014-04-09: qty 2

## 2014-04-09 MED ORDER — CALCIUM ACETATE 667 MG PO CAPS
667.0000 mg | ORAL_CAPSULE | Freq: Two times a day (BID) | ORAL | Status: DC | PRN
  Filled 2014-04-09: qty 1

## 2014-04-09 MED ORDER — PENTAFLUOROPROP-TETRAFLUOROETH EX AERO: 1.0000 "application " | INHALATION_SPRAY | CUTANEOUS | Status: DC | PRN

## 2014-04-09 MED ORDER — CALCIUM ACETATE 667 MG PO CAPS
667.0000 mg | ORAL_CAPSULE | Freq: Three times a day (TID) | ORAL | Status: DC
  Administered 2014-04-09 – 2014-04-10 (×3): 667 mg via ORAL
  Filled 2014-04-09 (×6): qty 1

## 2014-04-09 MED ORDER — SODIUM CHLORIDE 0.9 % IV SOLN: 100.0000 mL | INTRAVENOUS | Status: DC | PRN

## 2014-04-09 MED ORDER — LIDOCAINE HCL (PF) 1 % IJ SOLN: 5.0000 mL | INTRAMUSCULAR | Status: DC | PRN

## 2014-04-09 MED ORDER — PARICALCITOL 5 MCG/ML IV SOLN
2.0000 ug | INTRAVENOUS | Status: DC
  Filled 2014-04-09: qty 0.4

## 2014-04-09 MED ORDER — DARBEPOETIN ALFA 100 MCG/0.5ML IJ SOSY: 100.0000 ug | PREFILLED_SYRINGE | INTRAMUSCULAR | Status: DC

## 2014-04-09 MED ORDER — ALTEPLASE 2 MG IJ SOLR
2.0000 mg | Freq: Once | INTRAMUSCULAR | Status: DC | PRN
  Filled 2014-04-09: qty 2

## 2014-04-09 MED ORDER — LIDOCAINE-PRILOCAINE 2.5-2.5 % EX CREA: 1.0000 "application " | TOPICAL_CREAM | CUTANEOUS | Status: DC | PRN

## 2014-04-09 MED ORDER — PARICALCITOL 5 MCG/ML IV SOLN
2.0000 ug | Freq: Once | INTRAVENOUS | Status: DC
  Filled 2014-04-09: qty 0.4

## 2014-04-09 MED ORDER — NEPRO/CARBSTEADY PO LIQD: 237.0000 mL | ORAL | Status: DC | PRN

## 2014-04-09 NOTE — Progress Notes (Signed)
PROGRESS NOTE  Adam Walsh:[id number] DOB: December 17, 1940 DOA: 04/04/2014 PCP: Donnie Coffin, MD  HPI/Recap of past 24 hours: Patient is a 73 year old male with past mental history of end-stage renal disease and myasthenia gravis who was admitted on 11/6 for worsening weakness felt to possibly be from worsening myasthenia versus diarrhea-later found to be C. Difficile.  Patient started on IV Flagyl and oral vancomycin as well as a course of steroids for possible myasthenia exacerbation. Patient also found to be pancytopenic and had been recently started on methotrexate. since admission, patient has had multiple runs of SVT and started on a beta blocker as per cardiology.  Patient irritable today. Frustrated and feels like he is getting better once to go home soon  Assessment/Plan: Principal Problem:  weakness in the setting of history of Myasthenia gravis: seen by neurology and started on IV steroids. Patient refused physical therapy today. Overall his weakness is improved.noted to have no bulbar signs at this time and his left upper extremity weakness predated his recent illness. They felt the possibility of a cervical etiology and recommended a CT scan of the cervical spine which the patient refused last night. Active Problems:   Hypertension: Blood pressure has remained stable   End stage renal disease: Continue dialysis as per nephrology   Weakness generalized: may be more multifactorial in the setting of pancytopenia plus C. Difficile plus episodes of SVT   Pancytopenia: Patient started on each pain precautions plus antibodies only for C. Difficile colitis. Platelets were stabilizing, but dropped to 13 today. We'll discuss with hematology  Diarrhea from C. Difficile colitis colon on Flagyl and by mouth vancomycin    PSVT (paroxysmal supraventricular tachycardia): Started on beta blocker following cardiology consult. Recommendation is to titrate up as needed. If no improvement, try  Cardizem. Electrolytes replaced. Cardiology will follow up and consult EP if no significant improvement.  Odynophagia from oral thrush: Initially on scheduled nystatin and Magic mouthwash without much improvement. This is likely not absorbing much due to C. Difficile colitis. IV Diflucan started 11/10.   Code Status: Full code  Family Communication: wife at bedside  Disposition Plan: home once pancytopenia resolved, C. Difficile stable   Consultants:  Neurology  Nephrology  Procedures:  Dialysis 3X week  Antibiotics:  IV Rocephin 11/71 dose  IV Flagyl 11/7present  Oral vancomycin 11/7present  Diflucan 11/10-present   Objective: BP 119/46 mmHg  Pulse 58  Temp(Src) 100.2 F (37.9 C) (Oral)  Resp 20  Ht 5\' 10"  (1.778 m)  Wt 77.2 kg (170 lb 3.1 oz)  BMI 24.42 kg/m2  SpO2 95%  Intake/Output Summary (Last 24 hours) at 04/09/14 1620 Last data filed at 04/09/14 0910  Gross per 24 hour  Intake    240 ml  Output      0 ml  Net    240 ml   Filed Weights   04/07/14 1552 04/08/14 2028 04/09/14 1300  Weight: 76 kg (167 lb 8.8 oz) 78.6 kg (173 lb 4.5 oz) 77.2 kg (170 lb 3.1 oz)    Exam:   General:  Alert and oriented 3, no acute distress  Cardiovascular: regular rate and rhythm, S1-S2  Respiratory: clear to auscultation bilaterally  Abdomen: soft, nontender, nondistended, positive bowel sounds  Musculoskeletal: noclubbing or cyanosis or edema   Data Reviewed: Basic Metabolic Panel:  Recent Labs Lab 04/06/14 0530 04/06/14 0740 04/07/14 0900 04/08/14 0322 04/09/14 0500 04/09/14 1331  NA  --  138 138 139 139 139  K  --  3.5* 4.1 5.2 4.7 4.8  CL  --  100 98 102 103 104  CO2  --  26 23 26 24 24   GLUCOSE  --  103* 121* 130* 108* 110*  BUN  --  25* 37* 15 27* 28*  CREATININE  --  4.26* 5.94* 3.50* 5.35* 5.47*  CALCIUM  --  8.3* 9.0 8.3* 9.1 8.9  MG 1.9  --   --  2.2  --   --   PHOS  --   --   --  2.9  --  3.1   Liver Function Tests:  Recent  Labs Lab 04/04/14 1600 04/05/14 0503 04/09/14 1331  AST 31 23  --   ALT 36 27  --   ALKPHOS 57 53  --   BILITOT 0.9 0.8  --   PROT 5.3* 4.9*  --   ALBUMIN 2.7* 2.4* 2.5*   No results for input(s): LIPASE, AMYLASE in the last 168 hours. No results for input(s): AMMONIA in the last 168 hours. CBC:  Recent Labs Lab 04/04/14 1600 04/04/14 2200 04/05/14 0503 04/06/14 0740 04/07/14 0900 04/08/14 0322 04/09/14 0500  WBC 1.1* 1.3* 1.5* 1.6* 1.6* 0.9* 1.0*  NEUTROABS 0.6* 0.3* 0.3*  --   --   --   --   HGB 10.5* 10.3* 10.0* 9.1* 9.3* 8.6* 8.4*  HCT 31.4* 30.9* 30.1* 27.1* 27.4* 25.7* 25.9*  MCV 95.2 95.1 95.6 96.4 93.5 95.5 97.7  PLT 35* 32* 23* 40* 33* 24* 13*   Cardiac Enzymes:    Recent Labs Lab 04/05/14 0503 04/05/14 0905 04/05/14 1800 04/05/14 2100  CKTOTAL 17  --   --   --   CKMB 1.0  --   --   --   TROPONINI  --  <0.30 <0.30 <0.30   BNP (last 3 results) No results for input(s): PROBNP in the last 8760 hours. CBG: No results for input(s): GLUCAP in the last 168 hours.  Recent Results (from the past 240 hour(s))  Stool culture     Status: None   Collection Time: 04/04/14 12:58 AM  Result Value Ref Range Status   Specimen Description STOOL  Final   Special Requests Immunocompromised  Final   Culture   Final    NO SALMONELLA, SHIGELLA, CAMPYLOBACTER, YERSINIA, OR E.COLI 0157:H7 ISOLATED Performed at [facility]    Report Status 04/08/2014 FINAL  Final  Clostridium Difficile by PCR     Status: Abnormal   Collection Time: 04/04/14 11:52 PM  Result Value Ref Range Status   C difficile by pcr POSITIVE (A) NEGATIVE Final    Comment: CRITICAL RESULT CALLED TO, READ BACK BY AND VERIFIED WITH: COLLINS,C RN 04/04/14 1157 [place]   Culture, blood (routine x 2)     Status: None (Preliminary result)   Collection Time: 04/05/14  8:55 AM  Result Value Ref Range Status   Specimen Description BLOOD RIGHT ARM  Final   Special Requests BOTTLES DRAWN AEROBIC  ONLY 4CC  Final   Culture  Setup Time   Final    04/05/2014 14:51 Performed at [facility]    Culture   Final           BLOOD CULTURE RECEIVED NO GROWTH TO DATE CULTURE WILL BE HELD FOR 5 DAYS BEFORE ISSUING A FINAL NEGATIVE REPORT Performed at [facility]    Report Status PENDING  Incomplete  Culture, blood (routine x 2)     Status: None (Preliminary result)   Collection Time:  04/05/14  9:05 AM  Result Value Ref Range Status   Specimen Description BLOOD RIGHT ARM  Final   Special Requests BOTTLES DRAWN AEROBIC ONLY 5CC  Final   Culture  Setup Time   Final    04/05/2014 14:51 Performed at [facility]    Culture   Final           BLOOD CULTURE RECEIVED NO GROWTH TO DATE CULTURE WILL BE HELD FOR 5 DAYS BEFORE ISSUING A FINAL NEGATIVE REPORT Performed at [facility]    Report Status PENDING  Incomplete  Urine culture     Status: None   Collection Time: 04/05/14 11:57 AM  Result Value Ref Range Status   Specimen Description URINE, RANDOM  Final   Special Requests NONE  Final   Culture  Setup Time   Final    04/05/2014 17:37 Performed at [place]   Final    1,000 COLONIES/ML Performed at [facility]    Culture   Final    INSIGNIFICANT GROWTH Performed at [facility]    Report Status 04/07/2014 FINAL  Final     Studies: No results found.  Scheduled Meds: . calcium acetate  667 mg Oral TID WC  . clotrimazole  10 mg Oral 5 X Daily  . [START ON 04/12/2014] darbepoetin (ARANESP) injection - DIALYSIS  100 mcg Intravenous Q Sat-HD  . feeding supplement (NEPRO CARB STEADY)  237 mL Oral BID BM  . feeding supplement (RESOURCE BREEZE)  1 Container Oral BID BM  . fluconazole (DIFLUCAN) IV  200 mg Intravenous Q24H  . folic acid  1 mg Oral Daily  . gabapentin  300 mg Oral QHS  . magic mouthwash w/lidocaine  5 mL Oral TID AC & HS  . metoprolol tartrate  25 mg Oral BID  . metronidazole  500 mg  Intravenous Q8H  . multivitamin  1 tablet Oral Daily  . nystatin  5 mL Oral QID  . paricalcitol  2 mcg Intravenous Once in dialysis  . paricalcitol  2 mcg Intravenous Once per day on Mon Wed Fri  . paricalcitol  2 mcg Intravenous Once in dialysis  . predniSONE  20 mg Oral Daily  . sodium chloride  3 mL Intravenous Q12H  . sodium chloride  3 mL Intravenous Q12H  . vancomycin  125 mg Oral 4 times per day    Continuous Infusions:    Time spent: 25 min  [place] [facility] Pager [phone]. If 7PM-7AM, please contact night-coverage at [email or web address], password [facility] 04/09/2014, 4:20 PM  LOS: 5 days

## 2014-04-09 NOTE — Progress Notes (Addendum)
Subjective:  Mouth pain better and reports more formed stool, will try to sit in recliner for hd today,walkingin room and back pain not a problem this am  Objective Vital signs in last 24 hours: Filed Vitals:   04/08/14 1508 04/08/14 1705 04/08/14 2028 04/09/14 0500  BP: 104/59 102/55 103/76 113/64  Pulse: 70 65 64 59  Temp: 100.1 F (37.8 C) 99.6 F (37.6 C) 99.6 F (37.6 C) 98.9 F (37.2 C)  TempSrc: Oral Oral  Oral  Resp: 16 16 17 18   Height:      Weight:   78.6 kg (173 lb 4.5 oz)   SpO2: 98% 100% 98% 100%   Weight change: 1.7 kg (3 lb 12 oz) Physical Exam: General: Alert,NAD , in bed this am but pleasant this am Heart: RRR, no mur, gallop ,or rub Lungs: CTA bilat  Abdomen: bs pos. ,soft, nontender, nondistended Extremities:no pedal edema/ SLR  To 45 degrees without back pain/ no palpable back discomfort  Dialysis Access: pos bruit L UA AVF  Dialysis Orders: Home HD using NxStage with PureFlow 4X Week, MonWedFriSun, CAR 172, Therapy Fluid 1.0 K 45 Lactate, Volume per Tx 25 (liters), Flow Fraction 45 %, BFR 400, EDW 84 NO heparin Epogen-8000/wk down from 11 K in Sept Paricalcititol 2 mcg 3x week Recent labs: Hgb 11 10/12 tsat 23% and ferritin 1660 11/1 iPTH 314 10/1  Assessment/Plan: 1. Weakness - Resolved Walking in Room / multifactorial, Cdif colitis, painful thrush,  chronicback pain, MG 2. ESRD - On home HD MWFSUN-In Hosp HD 4hrs MWF in hosp- no heparin - 3. Thrush - chlotrimazole troches and nystatin/diflucan 4. PSVT - Cardiology seeing On Metoprolol 25 mg bidHrt rate in 50s'- 60 Asymp."Ep To see" if reoccurring  5. Pancytopenia - WBC / platelets low - CBC pending hd today/ Hgb only slight lower than most recent outpt Hgb- had platelet transfusion Pt's wife says he was just started on methotrexate for RA and had just taken a few doses 6. C. Diff Colitis- Diarrhea / abd pain Resolving /CT abdomen c/w infectious vs inflammatory colitis - on metronidazole/  po Vanco 7. Hypertension/volume - bp 113/64 with CXR on admit= NAD/ outpt prior edw 84 but he hasn't been eating well;WT  78 kg yesterday/ On Metoprolol 25mg  bid with PSVT monitor bp With HD but no excess vol uf needed/ keep even Next hd  today. 8. Anemia - Hgb 8.6 , Epogen 8000 weekly;given in hosp Aranesp 100 q Sat HD/ wiil check Fe and change Aranesp to Wed give 40 tomorrow 9. Metabolic bone disease - Continue zemplar, calcium acetate 2 ac 10. Back pain --per primary/ now improved from yest/ noted refused CT last pm "my back pain is better" 11. Nutrition - ALB2.4, intake poor due to thrush - on gluten free diet 12. Celiac sprue - with gluten allergy - RD to see due to poor nutrition, thrush and gluten allergy. 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 HX MSSA bacteremia and cellulitis/abscess AVF 01/2014 - repeat BC pending 15. Myasthenia Gravis 16.  Ernest Haber, PA-C [facility] [phone] 04/09/2014,9:11 AM  LOS: 5 days  I have seen and examined this patient and agree with plan per Ernest Haber.  Mouth pain improving.  He is eating.  Diarrhea better.  He is ready to go when medical issues are resolved.  HD today and recheck WBC.  Will decrease his PO4 binder. Odessa Nishi T,MD 04/09/2014 9:50 AM Labs: Basic Metabolic Panel:  Recent Labs Lab 04/06/14  0740 04/07/14 0900 04/08/14 0322  NA 138 138 139  K 3.5* 4.1 5.2  CL 100 98 102  CO2 26 23 26   GLUCOSE 103* 121* 130*  BUN 25* 37* 15  CREATININE 4.26* 5.94* 3.50*  CALCIUM 8.3* 9.0 8.3*  PHOS  --   --  2.9   Liver Function Tests:  Recent Labs Lab 04/04/14 1600 04/05/14 0503  AST 31 23  ALT 36 27  ALKPHOS 57 53  BILITOT 0.9 0.8  PROT 5.3* 4.9*  ALBUMIN 2.7* 2.4*   No results for input(s): LIPASE, AMYLASE in the last 168 hours. No results for input(s): AMMONIA in the last 168 hours. CBC:  Recent Labs Lab 04/04/14 1600 04/04/14 2200 04/05/14 0503 04/06/14 0740  04/07/14 0900 04/08/14 0322  WBC 1.1* 1.3* 1.5* 1.6* 1.6* 0.9*  NEUTROABS 0.6* 0.3* 0.3*  --   --   --   HGB 10.5* 10.3* 10.0* 9.1* 9.3* 8.6*  HCT 31.4* 30.9* 30.1* 27.1* 27.4* 25.7*  MCV 95.2 95.1 95.6 96.4 93.5 95.5  PLT 35* 32* 23* 40* 33* 24*   Cardiac Enzymes:  Recent Labs Lab 04/05/14 0503 04/05/14 0905 04/05/14 1800 04/05/14 2100  CKTOTAL 17  --   --   --   CKMB 1.0  --   --   --   TROPONINI  --  <0.30 <0.30 <0.30   CBG: No results for input(s): GLUCAP in the last 168 hours.  Studies/Results: No results found. Medications:   . calcium acetate  1,334 mg Oral TID WC  . clotrimazole  10 mg Oral 5 X Daily  . [START ON 04/12/2014] darbepoetin (ARANESP) injection - DIALYSIS  100 mcg Intravenous Q Sat-HD  . feeding supplement (NEPRO CARB STEADY)  237 mL Oral BID BM  . feeding supplement (RESOURCE BREEZE)  1 Container Oral BID BM  . fluconazole (DIFLUCAN) IV  200 mg Intravenous Q24H  . folic acid  1 mg Oral Daily  . gabapentin  300 mg Oral QHS  . magic mouthwash w/lidocaine  5 mL Oral TID AC & HS  . metoprolol tartrate  25 mg Oral BID  . metronidazole  500 mg Intravenous Q8H  . multivitamin  1 tablet Oral Daily  . nystatin  5 mL Oral QID  . paricalcitol  2 mcg Intravenous Once in dialysis  . paricalcitol  2 mcg Intravenous Once per day on Mon Wed Fri  . paricalcitol  2 mcg Intravenous Once in dialysis  . predniSONE  20 mg Oral Daily  . sodium chloride  3 mL Intravenous Q12H  . sodium chloride  3 mL Intravenous Q12H  . vancomycin  125 mg Oral 4 times per day

## 2014-04-09 NOTE — Progress Notes (Addendum)
PT Cancellation Note and Discharge  Patient Details Name: Adam Walsh MRN: [id number] DOB: [birth year]   Cancelled Treatment:    Reason Eval/Treat Not Completed: Patient declined, no reason specified. Pt became agitated when PT entered, stating he does not need PT and this visit is "another way to get more money" out of the pt. Pt ws educated on reasoning behind PT consult, and pt got out of the bed and walked around the room to "show" therapist that he is "fine". Pt asked that therapist not return. Will sign off at this time.    Rolinda Roan 04/09/2014, 9:33 AM   Rolinda Roan, PT, DPT Acute Rehabilitation Services Pager: [phone]

## 2014-04-09 NOTE — Plan of Care (Signed)
Problem: Phase II Progression Outcomes Goal: Progress activity as tolerated unless otherwise ordered Outcome: Progressing Goal: Vital signs remain stable Outcome: Progressing Pt temp at 99 as baseline.     Goal: IV changed to normal saline lock Outcome: Progressing Still on abx IV.  Goal: Obtain order to discontinue catheter if appropriate Outcome: Not Applicable Date Met:  44/96/75 Goal: Other Phase II Outcomes/Goals Outcome: Not Applicable Date Met:  91/63/84

## 2014-04-09 NOTE — H&P (Signed)
CRITICAL VALUE ALERT  Critical value received:  Platelets 13.0  Date of notification:  04/09/2014  Time of notification:  1512  Critical value read back:yes  Nurse who received alert:  Dao Mearns,RN  MD notified (1st page):  yes  Time of first page:  1512  MD notified (2nd page):  Time of second page:  Responding MD:  Dr. Maryland Pink  Time MD responded:  [phone]

## 2014-04-10 DIAGNOSIS — G7000 Myasthenia gravis without (acute) exacerbation: Secondary | ICD-10-CM

## 2014-04-10 DIAGNOSIS — B370 Candidal stomatitis: Secondary | ICD-10-CM

## 2014-04-10 DIAGNOSIS — A047 Enterocolitis due to Clostridium difficile: Secondary | ICD-10-CM

## 2014-04-10 LAB — CBC
HCT: 29.4 % — ABNORMAL LOW (ref 39.0–52.0)
HEMOGLOBIN: 9.4 g/dL — AB (ref 13.0–17.0)
MCH: 31.6 pg (ref 26.0–34.0)
MCHC: 32 g/dL (ref 30.0–36.0)
MCV: 99 fL (ref 78.0–100.0)
PLATELETS: 19 10*3/uL — AB (ref 150–400)
RBC: 2.97 MIL/uL — AB (ref 4.22–5.81)
RDW: 16.2 % — ABNORMAL HIGH (ref 11.5–15.5)
WBC: 1.4 10*3/uL — CL (ref 4.0–10.5)

## 2014-04-10 LAB — BASIC METABOLIC PANEL
ANION GAP: 9 (ref 5–15)
BUN: 10 mg/dL (ref 6–23)
CALCIUM: 9 mg/dL (ref 8.4–10.5)
CO2: 30 meq/L (ref 19–32)
Chloride: 99 mEq/L (ref 96–112)
Creatinine, Ser: 3.52 mg/dL — ABNORMAL HIGH (ref 0.50–1.35)
GFR calc Af Amer: 19 mL/min — ABNORMAL LOW (ref >90)
GFR, EST NON AFRICAN AMERICAN: 16 mL/min — AB (ref >90)
Glucose, Bld: 103 mg/dL — ABNORMAL HIGH (ref 70–99)
Potassium: 4.9 mEq/L (ref 3.7–5.3)
SODIUM: 138 meq/L (ref 137–147)

## 2014-04-10 MED ORDER — PREDNISONE 20 MG PO TABS: 20.0000 mg | ORAL_TABLET | Freq: Every day | ORAL | Status: AC

## 2014-04-10 MED ORDER — MORPHINE SULFATE 2 MG/ML IJ SOLN
2.0000 mg | Freq: Once | INTRAMUSCULAR | Status: AC
  Administered 2014-04-10: 2 mg via INTRAVENOUS
  Filled 2014-04-10: qty 1

## 2014-04-10 MED ORDER — MORPHINE SULFATE (CONCENTRATE) 10 MG /0.5 ML PO SOLN: 10.0000 mg | ORAL | Status: AC | PRN

## 2014-04-10 MED ORDER — CLONAZEPAM 0.5 MG PO TABS
0.5000 mg | ORAL_TABLET | Freq: Every day | ORAL | Status: AC | PRN
Start: 2014-04-10 — End: ?

## 2014-04-10 NOTE — Care Management Note (Signed)
CARE MANAGEMENT NOTE 04/10/2014  Patient:  Adam Walsh, Adam Walsh   Account Number:  [id number]  Date Initiated:  04/07/2014  Documentation initiated by:  Jasmine Pang  Subjective/Objective Assessment:   CM following for progression and d/c planning     Action/Plan:   04/10/2014 Met with pt and wife and offered choice of Home Hospice agencies to provide [facility] services. The pt and wife selected [facility]. That agency RN rep notifed and Mellody Memos RN will see pt and wife at 11am today.   Anticipated DC Date:  04/10/2014   Anticipated DC Plan:  HOME W HOSPICE CARE         Choice offered to / List presented to:     DME arranged  [facility]      DME agency  [place] arranged  HH-1 RN  [place] agency  [facility] [place]   Status of service:   Medicare Important Message given?  YES (If response is "NO", the following Medicare IM given date fields will be blank) Date Medicare IM given:  04/07/2014 Medicare IM given by:  Klaus Casteneda Date Additional Medicare IM given:   Additional Medicare IM given by:    Discharge Disposition:    Per UR Regulation:    If discussed at Long Length of Stay Meetings, dates discussed:    Comments:

## 2014-04-10 NOTE — Progress Notes (Signed)
Subjective: Had to sign off hd last night sec pain back / ribs as prior hd/ frustrated , wife in room this am "Im ready to Stop HD"Vicodin not helping pain/  No diarrhea   Objective Vital signs in last 24 hours: Filed Vitals:   04/09/14 1744 04/09/14 1919 04/09/14 2100 04/10/14 0605  BP: 99/71 79/65 83/44  107/56  Pulse: 72 112 77 86  Temp: 99.6 F (37.6 C) 99.9 F (37.7 C) 100.3 F (37.9 C) 101.7 F (38.7 C)  TempSrc: Oral Oral Oral Oral  Resp: 22 20 20    Height:   5\' 10"  (1.778 m)   Weight:   78.608 kg (173 lb 4.8 oz)   SpO2: 91% 98% 98% 100%   Weight change: -1.4 kg (-3 lb 1.4 oz) Physical Exam: General: Alert,Anxious , tearful about his situation, in bed  Heart: RRR, no mur, gallop ,or rub Lungs: CTA bilat  Abdomen: bs pos. ,soft, nontender, nondistended Extremities:no pedal edema Dialysis Access: pos bruit L UA AVF  Dialysis Orders: Home HD using NxStage with PureFlow 4X Week, MonWedFriSun, CAR 172, Therapy Fluid 1.0 K 45 Lactate, Volume per Tx 25 (liters), Flow Fraction 45 %, BFR 400, EDW 84 NO heparin Epogen-8000/wk down from 11 K in Sept Paricalcititol 2 mcg 3x week Recent labs: Hgb 11 10/12 tsat 23% and ferritin 1660 11/1 iPTH 314 10/1  Assessment/Plan: 1. Weakness -  multifactorial, Cdif colitis, painful thrush, chronicback pain, MG      Give MS 2mg  now and further DW Dr. Mercy Moore  About stopping HD/ 2. ESRD - On home HD MWFSUN-In Hosp HD 4hrs MWF in hosp- no heparin - 3. Thrush - chlotrimazole troches and nystatin/diflucan 4. PSVT - Cardiology eval this admit On Metoprolol 25 mg bidHrt rate in 50s'- 60 Asymp."Ep To see" if reoccurring  5. Pancytopenia - low - wbc 1.4<1.0<0.9 / plt 19<13<24  Hgb 9.4< 8.4 Pt's wife says he was just started on methotrexate for RA and had just taken a few doses 6. C. Diff Colitis- Diarrhea / abd pain Resolving /CT abdomen c/w infectious vs inflammatory colitis - on IV metronidazole/ po Vanco 7. Hypertension/volume - bp  107/56 with CXR on admit= NAD/ outpt prior edw 84 but he hasn't been eating well;WT 78.6 kg yesterday/ On Metoprolol 25mg  bid with PSVT monitor bp With HD but no excess vol uf  8. Anemia - Hgb 9.4 , Epogen 8000 weekly;given in hosp Aranesp 100 q Sat HD/ wiil check Fe and change Aranesp to Wed given 40  9. Metabolic bone disease - Continue zemplar, calcium acetate 2 ac 10. Back pain --per primary/ now improved from yest/ noted refused CT last pm "my back pain is better" 11. 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 HX MSSA bacteremia and cellulitis/abscess AVF 01/2014 - repeat BC pending 15. Myasthenia Gravis    Ernest Haber, PA-C [place] [phone] 04/10/2014,8:06 AM  LOS: 6 days  I have seen and examined this patient and agree with plan per Ernest Haber.  Long discussion with pt and family.  He has reached a point that he feel dialysis is keeping him alive to be miserable and wants to stop.  He has discussed this in the past but his health has deteriorated in last few months.  His family is supportive of his  decision.  He would like to go home with home hospice.Marland Kitchen Mariaisabel Bodiford T,MD 04/10/2014 10:19 AM Labs: Basic Metabolic Panel:  Recent Labs Lab 04/08/14 0322 04/09/14 0500 04/09/14 1331 04/10/14 0455  NA 139 139 139 138  K 5.2 4.7 4.8 4.9  CL 102 103 104 99  CO2 26 24 24 30   GLUCOSE 130* 108* 110* 103*  BUN 15 27* 28* 10  CREATININE 3.50* 5.35* 5.47* 3.52*  CALCIUM 8.3* 9.1 8.9 9.0  PHOS 2.9  --  3.1  --    Liver Function Tests:  Recent Labs Lab 04/04/14 1600 04/05/14 0503 04/09/14 1331  AST 31 23  --   ALT 36 27  --   ALKPHOS 57 53  --   BILITOT 0.9 0.8  --   PROT 5.3* 4.9*  --   ALBUMIN 2.7* 2.4* 2.5*  CBC:  Recent Labs Lab 04/04/14 1600  04/04/14 2200 04/05/14 0503 04/06/14 0740 04/07/14 0900 04/08/14 0322 04/09/14 0500 04/10/14 0455  WBC 1.1* 1.3* 1.5* 1.6* 1.6* 0.9* 1.0* 1.4*  NEUTROABS 0.6* 0.3* 0.3*  --   --   --   --   --   HGB 10.5* 10.3* 10.0* 9.1* 9.3* 8.6* 8.4* 9.4*  HCT 31.4* 30.9* 30.1* 27.1* 27.4* 25.7* 25.9* 29.4*  MCV 95.2 95.1 95.6 96.4 93.5 95.5 97.7 99.0  PLT 35* 32* 23* 40* 33* 24* 13* 19*   Cardiac Enzymes:  Recent Labs Lab 04/05/14 0503 04/05/14 0905 04/05/14 1800 04/05/14 2100  CKTOTAL 17  --   --   --   CKMB 1.0  --   --   --   TROPONINI  --  <0.30 <0.30 <0.30   CBG: No results for input(s): GLUCAP in the last 168 hours.  Studies/Results: No results found. Medications:   . calcium acetate  667 mg Oral TID WC  . clotrimazole  10 mg Oral 5 X Daily  . feeding supplement (NEPRO CARB STEADY)  237 mL Oral BID BM  . feeding supplement (RESOURCE BREEZE)  1 Container Oral BID BM  . fluconazole (DIFLUCAN) IV  200 mg Intravenous Q24H  . folic acid  1 mg Oral Daily  . gabapentin  300 mg Oral QHS  . magic mouthwash w/lidocaine  5 mL Oral TID AC & HS  . metoprolol tartrate  25 mg Oral BID  . metronidazole  500 mg Intravenous Q8H  . multivitamin  1 tablet Oral Daily  . nystatin  5 mL Oral QID  . paricalcitol  2 mcg Intravenous Once in dialysis  . predniSONE  20 mg Oral Daily  . sodium chloride  3 mL Intravenous Q12H  . sodium chloride  3 mL Intravenous Q12H  . vancomycin  125 mg Oral 4 times per day

## 2014-04-10 NOTE — Progress Notes (Signed)
Pt. Discharged home on hospice. Gold DNR form given with prescription to wife. IV removed. AVS signed. Home Hospice arranged through Case Management.  Penni Bombard, RN 04/10/2014 3:38 PM

## 2014-04-10 NOTE — Discharge Summary (Addendum)
Discharge Summary  Adam Walsh:[id number] DOB: [birth year]  PCP: Donnie Coffin, MD  Admit date: 04/04/2014 Discharge date: 04/10/2014  Time spent: 25 minutes  Recommendations for Outpatient Follow-up:  1. Patient is going home with hospice. Most of his medications have been discontinued, continuing only the ones for comfort, pain. He will be followed by home hospice services 2. Patient is stopping dialysis   Discharge Diagnoses:  Active Hospital Problems   Diagnosis Date Noted  . Myasthenia gravis without acute exacerbation 01/24/2013  . Upper extremity weakness   . PSVT (paroxysmal supraventricular tachycardia) 04/07/2014  . Colitis 04/05/2014  . Weakness generalized 04/04/2014  . Pancytopenia 04/04/2014  . Weakness 04/04/2014  . Diarrhea 04/04/2014  . End stage renal disease 02/28/2014  . Hypertension     Resolved Hospital Problems   Diagnosis Date Noted Date Resolved  No resolved problems to display.    Discharge Condition: stable  Diet recommendation: regular  Filed Weights   04/09/14 1300 04/09/14 1621 04/09/14 2100  Weight: 77.2 kg (170 lb 3.1 oz) 77.2 kg (170 lb 3.1 oz) 78.608 kg (173 lb 4.8 oz)    History of present illness:  Patient is a 73 year old male with past mental history of end-stage renal disease and myasthenia gravis who was admitted on 11/6 for worsening weakness felt to possibly be from worsening myasthenia versus diarrhea-later found to be C. Difficile.  Hospital Course:  Principal Problem:   Myasthenia gravis without acute exacerbation: Patient initially started on steroids.  Neurology consult. They found no bulbar signs. He had some improvement with IV steroids. If of the possibility that this may not be related directly to his myasthenia and they may be more of a deconditioning secondary to infection and possible cervical etiology. They recommended CT scan, which the patient declined. All medications are being discontinued now that he  is to be comfort care Active Problems:   Hypertension: Blood pressure itself has been stable.   End stage renal diseasecolon seen by nephrology. Patient has decided after prolonged course of this illness plus in regards to his overall life quality for the last 3 years to stop doing dialysis. He has had previous discussions with his nephrologist as well as his wife. He has come to this formal decision today, 11/12 and we will respect these wishes. Hospice is being set up to follow patient at home. Patient's medications for the most part are being discontinued except for pain, anxiety and comfort.   Weakness generalized with focal upper extremity weakness: Initially felt to be myasthenia gravis, then more multifactorial second to overall deconditioning plus infection   Pancytopenia:patient had been started in the last few months on methotrexate which has been felt to be a contribute factor to this. Further workup now deferred now that patient is Comfort Care    Diarrhea secondary to C. Difficile colitis: Culture sent, be positive for C. Difficile. Patient started on IV Flagyl plus by mouth vancomycin. His diarrhea has actually since improved and stools more solid. He has however developed secondary thrush and has now decided overall to stop all treatments and become comfort care.    PSVT (paroxysmal supraventricular tachycardia): Started on beta blocker following cardiology consult who recommended titration up as needed. His medication is being discontinued now the patient is comfort care  Oral thrush: Patient developed this, likely in the setting of antibiotics from C. Difficile treatment. Initially started on by mouth Diflucan which gave him minimal relief, likely from poor absorption due to C. Difficile colitis.this was  changed over to IV Diflucan on 11/10. Patient has decided to stop all treatments   Procedures:  Dialysis 3 times a week  Consultations:  Nephrology  Neurology  Discharge  Exam: BP 111/58 mmHg  Pulse 70  Temp(Src) 100.5 F (38.1 C) (Oral)  Resp 18  Ht 5\' 10"  (1.778 m)  Wt 78.608 kg (173 lb 4.8 oz)  BMI 24.87 kg/m2  SpO2 97%  General: tearful, but comforted by deciding to stop all treatments Cardiovascular: regular rate and rhythm, S1-S2, borderline tachycardia Respiratory: clear to auscultation bilaterally  Discharge Instructions You were cared for by a hospitalist during your hospital stay. If you have any questions about your discharge medications or the care you received while you were in the hospital after you are discharged, you can call the unit and asked to speak with the hospitalist on call if the hospitalist that took care of you is not available. Once you are discharged, your primary care physician will handle any further medical issues. Please note that NO REFILLS for any discharge medications will be authorized once you are discharged, as it is imperative that you return to your primary care physician (or establish a relationship with a primary care physician if you do not have one) for your aftercare needs so that they can reassess your need for medications and monitor your lab values.  Discharge Instructions    Diet general    Complete by:  As directed      Increase activity slowly    Complete by:  As directed             Medication List    STOP taking these medications        aspirin 81 MG tablet     calcium acetate 667 MG capsule  Commonly known as:  PHOSLO     DIALYVITE 800 PO     EPOETIN ALFA IJ     folic acid 1 MG tablet  Commonly known as:  FOLVITE     HYDROcodone-acetaminophen 10-325 MG per tablet  Commonly known as:  NORCO     methotrexate 2.5 MG tablet  Commonly known as:  RHEUMATREX     metoprolol succinate 12.5 mg Tb24 24 hr tablet  Commonly known as:  TOPROL-XL     omeprazole 20 MG capsule  Commonly known as:  PRILOSEC      TAKE these medications        acetaminophen 325 MG tablet  Commonly known as:   TYLENOL  Take 2 tablets (650 mg total) by mouth every 6 (six) hours as needed for mild pain (or Fever >/= 101).     clonazePAM 0.5 MG tablet  Commonly known as:  KLONOPIN  Take 1 tablet (0.5 mg total) by mouth daily as needed (anxiety).     clotrimazole 10 MG troche  Commonly known as:  MYCELEX  Take 1 tablet (10 mg total) by mouth 5 (five) times daily.     FIRST-DUKES MOUTHWASH MT  Use as directed 5-10 mLs in the mouth or throat every 6 (six) hours. WITH LIDOCAINE     gabapentin 300 MG capsule  Commonly known as:  NEURONTIN  Take 300 mg by mouth at bedtime as needed. For feet/nerve pain     morphine CONCENTRATE 10 mg / 0.5 ml concentrated solution  Take 0.5 mLs (10 mg total) by mouth every 3 (three) hours as needed for severe pain.     predniSONE 20 MG tablet  Commonly known as:  DELTASONE  Take 1 tablet (20 mg total) by mouth daily with breakfast.        Allergies  Allergen Reactions  . Avelox [Moxifloxacin Hcl In Nacl] Other (See Comments)    PATIENT QUIT BREATHING / CARDIAC ARREST  . Gluten Anaphylaxis  . Gluten Meal Anaphylaxis  . Ciprofloxacin Other (See Comments)    Myasthenia gravis  . Erythromycin Other (See Comments)    Myasthenia gravis  . Erythromycin Base Other (See Comments)    Myasthenia gravis  . Gemifloxacin Other (See Comments)    Myasthenia gravis  . Gentamicin Other (See Comments)    Myasthenia gravis  . Heparin Other (See Comments)    Myasthenia gravis  . Kanamycin Other (See Comments)    Myasthenia gravis  . Levofloxacin Other (See Comments)    Myasthenia gravis  . Mestinon [Pyridostigmine] Diarrhea and Other (See Comments)    Myasthenia gravis  . Neomycin Other (See Comments)    Myasthenia gravis  . Norfloxacin Other (See Comments)    Myasthenia gravis  . Quinolones Other (See Comments)    Myasthenia gravis  . Streptomycin Other (See Comments)    Myasthenia gravis  . Tobramycin Other (See Comments)    Myasthenia gravis      The  results of significant diagnostics from this hospitalization (including imaging, microbiology, ancillary and laboratory) are listed below for reference.    Significant Diagnostic Studies: Ct Abdomen Pelvis Wo Contrast  04/05/2014   CLINICAL DATA:  All over abdominal pain. Nausea, vomiting, diarrhea.  EXAM: CT ABDOMEN AND PELVIS WITHOUT CONTRAST  TECHNIQUE: Multidetector CT imaging of the abdomen and pelvis was performed following the standard protocol without IV contrast.  COMPARISON:  04/29/2012  FINDINGS: Emphysematous changes in the lung bases with scattered peripheral fibrosis. Coronary artery calcifications.  Surgical absence of the gallbladder. Diffuse fatty infiltration of the pancreas. The unenhanced appearance of the liver, spleen, adrenal glands, abdominal aorta, and inferior vena cava is unremarkable. No retroperitoneal lymphadenopathy. Kidneys are atrophic bilaterally. There is a hypodense mass consistent with enlarged lymph node in the root of the mesenteric, measuring about 3.2 cm diameter. This is enlarged since previous study. Additional scattered mesenteric lymph nodes are also mildly enlarged. While these may be reactive, lymphoma or metastasis is not excluded. There is diffuse thickening of the colon wall with mild pericolonic infiltration most consistent with colitis. This could represent infectious or inflammatory etiology. Small bowel are decompressed. Stomach is decompressed. Postoperative changes at the EG junction. No free air or free fluid in the abdomen.  Pelvis: Calcification in the prostate gland. Bladder is decompressed. No free or loculated pelvic fluid collections appendix is not identified. No evidence of diverticulitis. Scattered diverticula in the sigmoid colon. Degenerative changes in the lumbar spine. Slight anterior subluxation of L4 on L5 is likely degenerative. No destructive bone lesions appreciated.  IMPRESSION: Diffuse colonic wall thickening with mild pericolonic  infiltration consistent with infectious or inflammatory colitis. Enlarged mesenteric lymph nodes, increasing since previous study, with largest node measuring 3.2 cm diameter. This is indeterminate.   Electronically Signed   By: Lucienne Capers M.D.   On: 04/05/2014 02:09   Dg Chest 2 View  04/04/2014   CLINICAL DATA:  Mouth swelling for 2 days. Back pain with history of slipped disc. Weakness.  EXAM: CHEST  2 VIEW  COMPARISON:  02/12/2014  FINDINGS: The right dialysis catheter has been removed. Heart size is mildly enlarged. Surgical clips are identified in the region of the gastroesophageal junction. The lungs are clear.  No pulmonary edema.  IMPRESSION: No evidence for acute  abnormality.   Electronically Signed   By: Shon Hale M.D.   On: 04/04/2014 15:30   Mr Brain Wo Contrast  04/04/2014   CLINICAL DATA:  Initial evaluation for in generalized weakness.  EXAM: MRI HEAD WITHOUT CONTRAST  TECHNIQUE: Multiplanar, multiecho pulse sequences of the brain and surrounding structures were obtained without intravenous contrast.  COMPARISON:  Prior CT from 04/16/2013  FINDINGS: Mild diffuse prominence of the CSF containing spaces is compatible with generalized cerebral atrophy, appropriate for age. Minimal small vessel changes present within the periventricular and deep white matter of both cerebral hemispheres.  No focal parenchymal signal abnormality is identified. No mass lesion, midline shift, or extra-axial fluid collection. Ventricles are normal in size without evidence of hydrocephalus.  No diffusion-weighted signal abnormality is identified to suggest acute intracranial infarct. Gray-white matter differentiation is maintained. Normal flow voids are seen within the intracranial vasculature. No intracranial hemorrhage identified.  The cervicomedullary junction is normal. Incidental note made of a partially empty sella. Pituitary stalk is midline. The globes and optic nerves demonstrate a normal appearance  with normal signal intensity. The  The bone marrow signal intensity is normal. Calvarium is intact. Visualized upper cervical spine is within normal limits.  Benign lipoma noted within the right suboccipital region. Scalp soft tissues are otherwise unremarkable.  Paranasal sinuses are clear.  No mastoid effusion.  IMPRESSION: 1. No acute intracranial infarct or other abnormality identified. 2. Mild atrophy with chronic small vessel ischemic disease.   Electronically Signed   By: Jeannine Boga M.D.   On: 04/04/2014 21:57   Dg Shoulder Left  03/25/2014   CLINICAL DATA:  Progressive loss of range of motion of the left shoulder. No known injury.  EXAM: LEFT SHOULDER - 2+ VIEW  COMPARISON:  None.  FINDINGS: Mild AC joint degenerative changes. No acute bony findings. The visualized left lung is clear.  IMPRESSION: No acute bony findings or significant degenerative changes.   Electronically Signed   By: Kalman Jewels M.D.   On: 03/25/2014 17:08    Microbiology: Recent Results (from the past 240 hour(s))  Stool culture     Status: None   Collection Time: 04/04/14 12:58 AM  Result Value Ref Range Status   Specimen Description STOOL  Final   Special Requests Immunocompromised  Final   Culture   Final    NO SALMONELLA, SHIGELLA, CAMPYLOBACTER, YERSINIA, OR E.COLI 0157:H7 ISOLATED Performed at [facility]    Report Status 04/08/2014 FINAL  Final  Clostridium Difficile by PCR     Status: Abnormal   Collection Time: 04/04/14 11:52 PM  Result Value Ref Range Status   C difficile by pcr POSITIVE (A) NEGATIVE Final    Comment: CRITICAL RESULT CALLED TO, READ BACK BY AND VERIFIED WITH: COLLINS,C RN 04/04/14 1157 [place]   Culture, blood (routine x 2)     Status: None (Preliminary result)   Collection Time: 04/05/14  8:55 AM  Result Value Ref Range Status   Specimen Description BLOOD RIGHT ARM  Final   Special Requests BOTTLES DRAWN AEROBIC ONLY 4CC  Final   Culture  Setup Time   Final     04/05/2014 14:51 Performed at [facility]    Culture   Final           BLOOD CULTURE RECEIVED NO GROWTH TO DATE CULTURE WILL BE HELD FOR 5 DAYS BEFORE ISSUING A FINAL NEGATIVE REPORT Performed at [facility]    Report Status PENDING  Incomplete  Culture, blood (routine x 2)     Status: None (Preliminary result)   Collection Time: 04/05/14  9:05 AM  Result Value Ref Range Status   Specimen Description BLOOD RIGHT ARM  Final   Special Requests BOTTLES DRAWN AEROBIC ONLY 5CC  Final   Culture  Setup Time   Final    04/05/2014 14:51 Performed at [facility]    Culture   Final           BLOOD CULTURE RECEIVED NO GROWTH TO DATE CULTURE WILL BE HELD FOR 5 DAYS BEFORE ISSUING A FINAL NEGATIVE REPORT Performed at [facility]    Report Status PENDING  Incomplete  Urine culture     Status: None   Collection Time: 04/05/14 11:57 AM  Result Value Ref Range Status   Specimen Description URINE, RANDOM  Final   Special Requests NONE  Final   Culture  Setup Time   Final    04/05/2014 17:37 Performed at [facility]   Final    1,000 COLONIES/ML Performed at [facility]    Culture   Final    INSIGNIFICANT GROWTH Performed at [facility]    Report Status 04/07/2014 FINAL  Final     Labs: Basic Metabolic Panel:  Recent Labs Lab 04/06/14 0530  04/07/14 0900 04/08/14 0322 04/09/14 0500 04/09/14 1331 04/10/14 0455  NA  --   < > 138 139 139 139 138  K  --   < > 4.1 5.2 4.7 4.8 4.9  CL  --   < > 98 102 103 104 99  CO2  --   < > 23 26 24 24 30   GLUCOSE  --   < > 121* 130* 108* 110* 103*  BUN  --   < > 37* 15 27* 28* 10  CREATININE  --   < > 5.94* 3.50* 5.35* 5.47* 3.52*  CALCIUM  --   < > 9.0 8.3* 9.1 8.9 9.0  MG 1.9  --   --  2.2  --   --   --   PHOS  --   --   --  2.9  --  3.1  --   < > = values in this interval not displayed. Liver Function Tests:  Recent Labs Lab 04/04/14 1600 04/05/14 0503  04/09/14 1331  AST 31 23  --   ALT 36 27  --   ALKPHOS 57 53  --   BILITOT 0.9 0.8  --   PROT 5.3* 4.9*  --   ALBUMIN 2.7* 2.4* 2.5*   No results for input(s): LIPASE, AMYLASE in the last 168 hours. No results for input(s): AMMONIA in the last 168 hours. CBC:  Recent Labs Lab 04/04/14 1600 04/04/14 2200 04/05/14 0503 04/06/14 0740 04/07/14 0900 04/08/14 0322 04/09/14 0500 04/10/14 0455  WBC 1.1* 1.3* 1.5* 1.6* 1.6* 0.9* 1.0* 1.4*  NEUTROABS 0.6* 0.3* 0.3*  --   --   --   --   --   HGB 10.5* 10.3* 10.0* 9.1* 9.3* 8.6* 8.4* 9.4*  HCT 31.4* 30.9* 30.1* 27.1* 27.4* 25.7* 25.9* 29.4*  MCV 95.2 95.1 95.6 96.4 93.5 95.5 97.7 99.0  PLT 35* 32* 23* 40* 33* 24* 13* 19*   Cardiac Enzymes:  Recent Labs Lab 04/05/14 0503 04/05/14 0905 04/05/14 1800 04/05/14 2100  CKTOTAL 17  --   --   --  CKMB 1.0  --   --   --   TROPONINI  --  <0.30 <0.30 <0.30   BNP: BNP (last 3 results) No results for input(s): PROBNP in the last 8760 hours. CBG: No results for input(s): GLUCAP in the last 168 hours.     Signed:  Annita Brod  [facility] 04/10/2014, 12:07 PM

## 2014-04-10 NOTE — Progress Notes (Signed)
Subjective: Patient unchanged.  Only complaint remains that of swallowing.    Objective: Current vital signs: BP 111/58 mmHg  Pulse 70  Temp(Src) 100.5 F (38.1 C) (Oral)  Resp 18  Ht 5\' 10"  (1.778 m)  Wt 78.608 kg (173 lb 4.8 oz)  BMI 24.87 kg/m2  SpO2 97% Vital signs in last 24 hours: Temp:  [99.4 F (37.4 C)-101.7 F (38.7 C)] 100.5 F (38.1 C) (11/12 0926) Pulse Rate:  [58-112] 70 (11/12 0926) Resp:  [18-22] 18 (11/12 0926) BP: (79-119)/(44-71) 111/58 mmHg (11/12 0926) SpO2:  [91 %-100 %] 97 % (11/12 0926) Weight:  [77.2 kg (170 lb 3.1 oz)-78.608 kg (173 lb 4.8 oz)] 78.608 kg (173 lb 4.8 oz) (11/11 2100)  Intake/Output from previous day: 11/11 0701 - 11/12 0700 In: 480 [P.O.:480] Out: -10  Intake/Output this shift: Total I/O In: 240 [P.O.:240] Out: -  Nutritional status: Diet gluten free Diet general  Neurologic Exam: Mental Status: Alert, oriented, thought content appropriate. Speech fluent without evidence of aphasia. Able to follow 3 step commands without difficulty. Cranial Nerves: II: Discs flat bilaterally; Visual fields grossly normal, pupils equal, round, reactive to light and accommodation III,IV, VI: ptosis not present, extra-ocular motions intact bilaterally V,VII: smile symmetric, facial light touch sensation normal bilaterally VIII: hearing normal bilaterally IX,X: gag reflex present XI: bilateral shoulder shrug XII: midline tongue extension Motor: Patient able to lift all extremities against gravity except the LUE. Once arm lifted actively to 90 degrees patient can lift the arm above head and maintain.  Sensory: Pinprick and light touch intact throughout, bilaterally Deep Tendon Reflexes: 2+ and symmetric throughout Plantars: Right: downgoingLeft: downgoing   Lab Results: Basic Metabolic Panel:  Recent Labs Lab 04/06/14 0530  04/07/14 0900 04/08/14 0322 04/09/14 0500 04/09/14 1331 04/10/14 0455  NA   --   < > 138 139 139 139 138  K  --   < > 4.1 5.2 4.7 4.8 4.9  CL  --   < > 98 102 103 104 99  CO2  --   < > 23 26 24 24 30   GLUCOSE  --   < > 121* 130* 108* 110* 103*  BUN  --   < > 37* 15 27* 28* 10  CREATININE  --   < > 5.94* 3.50* 5.35* 5.47* 3.52*  CALCIUM  --   < > 9.0 8.3* 9.1 8.9 9.0  MG 1.9  --   --  2.2  --   --   --   PHOS  --   --   --  2.9  --  3.1  --   < > = values in this interval not displayed.  Liver Function Tests:  Recent Labs Lab 04/04/14 1600 04/05/14 0503 04/09/14 1331  AST 31 23  --   ALT 36 27  --   ALKPHOS 57 53  --   BILITOT 0.9 0.8  --   PROT 5.3* 4.9*  --   ALBUMIN 2.7* 2.4* 2.5*   No results for input(s): LIPASE, AMYLASE in the last 168 hours. No results for input(s): AMMONIA in the last 168 hours.  CBC:  Recent Labs Lab 04/04/14 1600 04/04/14 2200 04/05/14 0503 04/06/14 0740 04/07/14 0900 04/08/14 0322 04/09/14 0500 04/10/14 0455  WBC 1.1* 1.3* 1.5* 1.6* 1.6* 0.9* 1.0* 1.4*  NEUTROABS 0.6* 0.3* 0.3*  --   --   --   --   --   HGB 10.5* 10.3* 10.0* 9.1* 9.3* 8.6* 8.4* 9.4*  HCT  31.4* 30.9* 30.1* 27.1* 27.4* 25.7* 25.9* 29.4*  MCV 95.2 95.1 95.6 96.4 93.5 95.5 97.7 99.0  PLT 35* 32* 23* 40* 33* 24* 13* 19*    Cardiac Enzymes:  Recent Labs Lab 04/05/14 0503 04/05/14 0905 04/05/14 1800 04/05/14 2100  CKTOTAL 17  --   --   --   CKMB 1.0  --   --   --   TROPONINI  --  <0.30 <0.30 <0.30    Lipid Panel: No results for input(s): CHOL, TRIG, HDL, CHOLHDL, VLDL, LDLCALC in the last 168 hours.  CBG: No results for input(s): GLUCAP in the last 168 hours.  Microbiology: Results for orders placed or performed during the hospital encounter of 04/04/14  Stool culture     Status: None   Collection Time: 04/04/14 12:58 AM  Result Value Ref Range Status   Specimen Description STOOL  Final   Special Requests Immunocompromised  Final   Culture   Final    NO SALMONELLA, SHIGELLA, CAMPYLOBACTER, YERSINIA, OR E.COLI 0157:H7  ISOLATED Performed at [facility]    Report Status 04/08/2014 FINAL  Final  Clostridium Difficile by PCR     Status: Abnormal   Collection Time: 04/04/14 11:52 PM  Result Value Ref Range Status   C difficile by pcr POSITIVE (A) NEGATIVE Final    Comment: CRITICAL RESULT CALLED TO, READ BACK BY AND VERIFIED WITH: COLLINS,C RN 04/04/14 1157 [place]   Culture, blood (routine x 2)     Status: None (Preliminary result)   Collection Time: 04/05/14  8:55 AM  Result Value Ref Range Status   Specimen Description BLOOD RIGHT ARM  Final   Special Requests BOTTLES DRAWN AEROBIC ONLY 4CC  Final   Culture  Setup Time   Final    04/05/2014 14:51 Performed at [facility]    Culture   Final           BLOOD CULTURE RECEIVED NO GROWTH TO DATE CULTURE WILL BE HELD FOR 5 DAYS BEFORE ISSUING A FINAL NEGATIVE REPORT Performed at [facility]    Report Status PENDING  Incomplete  Culture, blood (routine x 2)     Status: None (Preliminary result)   Collection Time: 04/05/14  9:05 AM  Result Value Ref Range Status   Specimen Description BLOOD RIGHT ARM  Final   Special Requests BOTTLES DRAWN AEROBIC ONLY 5CC  Final   Culture  Setup Time   Final    04/05/2014 14:51 Performed at [facility]    Culture   Final           BLOOD CULTURE RECEIVED NO GROWTH TO DATE CULTURE WILL BE HELD FOR 5 DAYS BEFORE ISSUING A FINAL NEGATIVE REPORT Performed at [facility]    Report Status PENDING  Incomplete  Urine culture     Status: None   Collection Time: 04/05/14 11:57 AM  Result Value Ref Range Status   Specimen Description URINE, RANDOM  Final   Special Requests NONE  Final   Culture  Setup Time   Final    04/05/2014 17:37 Performed at [place] [facility]   Final    1,000 COLONIES/ML Performed at [facility]    Culture   Final    INSIGNIFICANT GROWTH Performed at [facility]    Report Status 04/07/2014 FINAL  Final     Coagulation Studies: No results for input(s): LABPROT, INR in the last 72 hours.  Imaging: No  results found.  Medications:  I have reviewed the patient's current medications. Scheduled: . clotrimazole  10 mg Oral 5 X Daily  . feeding supplement (NEPRO CARB STEADY)  237 mL Oral BID BM  . feeding supplement (RESOURCE BREEZE)  1 Container Oral BID BM  . magic mouthwash w/lidocaine  5 mL Oral TID AC & HS  . nystatin  5 mL Oral QID  . paricalcitol  2 mcg Intravenous Once in dialysis    Assessment/Plan: Patient stable.  To remain on steroids.    Recommendations: 1.  F/U with neurology on an outpatient basis.     LOS: 6 days   Alexis Goodell, MD [facility] [phone] 04/10/2014  2:10 PM

## 2014-04-10 NOTE — Plan of Care (Signed)
Problem: Phase III Progression Outcomes Goal: Pain controlled on oral analgesia Outcome: Progressing Goal: Voiding independently Outcome: Completed/Met Date Met:  04/10/14 Goal: IV/normal saline lock discontinued Outcome: Progressing Goal: Foley discontinued Outcome: Not Applicable Date Met:  27/74/12

## 2014-04-10 NOTE — Progress Notes (Addendum)
Notified by [facility], patient and family request services of [facility]) after discharge.  Patient information reviewed with Dr Alferd Patee, [place] Director and eligibility confirmed. Spoke with patient and wife at bedside to initiate education related to hospice services, philosophy and team approach to care; they voiced good understanding of information provided. Patient and wife both shared their discussion with Dr Mercy Moore this morning-Mr Goldman was tearful during the discussion stated "I haven't felt good in months and the Dialysis, I just can't take it anymore; I know this means I will die, but I want to be comfortable for whatever time I have left whether that's just a couple of weeks or a month".  They are aware that Mr Kras will experience physical changes the further away from dialysis he gets; discussed [facility] team is available to pt/family for concerns, changes and they were encouraged to call the [facility] team with any concerns or changes 24/7.  Emotional support offered to patient and wife.  Mr Flye was uncomfortable during visit, c/o back and mouth pain, trying to change position in the recliner and got up and lied in the bed; staff RN Delana Meyer came in to give IV Morphine 2 mg at 11:45; pt and wife stated they wanted to get everything in order 'on this end' and the plan was to discharge today by personal car  *Wife and pt did ask to speak again with Dr Maryland Pink about medications that were being discontinued at discharge - pt is requesting to keep taking his prednisone as long as he is able-he feels this helps his pain *Please send completed GOLD DNR Form in shadow chart home with pt  DME needs discussed pt currently declines a hospital bed, does request, shower chair and  Oxygen "sometiimes I get short of breath" discussed with Dr Maryland Pink O2 1-2 LNC prn SOB/comfort and he was agreeable; call placed to DME co-ordiantor Arlie Solomons to request  order be placed with [facility] for delivery to the home contact is pt's wife Jana Half c: [phone]  Patient's attending with [facility] will be Dr Mercy Moore; pharmacy [facility] [place] Initial paperwork faxed to [facility]  Please notify [facility] when patient is ready to leave unit at d/c call [phone] (or [phone] if after 5 pm); [facility] information and contact numbers also given to wife Jana Half during visit.   Above information shared with [facility] Please call with any questions or concerns  Danton Sewer, RN 04/10/2014, 1:09 PM [facility] RN Liaison [phone]

## 2014-04-11 LAB — CULTURE, BLOOD (ROUTINE X 2)
Culture: NO GROWTH
Culture: NO GROWTH

## 2014-04-14 ENCOUNTER — Telehealth: Payer: Self-pay | Admitting: Neurology

## 2014-04-14 NOTE — Telephone Encounter (Signed)
Information noted. From looking at the history, not exactly sure what the cause of the severe decline in health is due to.the patient is severely pancytopenic, was on methotrexate for seronegative rheumatoid arthritis.

## 2014-04-14 NOTE — Telephone Encounter (Signed)
Patient's wife Jana Half calling to inform Dr. Jannifer Franklin that patient is currently under hospice care and she thanks Dr. Jannifer Franklin for all of his help, if questions, please call.

## 2014-04-16 ENCOUNTER — Encounter: Payer: Self-pay | Admitting: Neurology

## 2014-04-22 ENCOUNTER — Encounter: Payer: Self-pay | Admitting: Neurology

## 2014-04-29 DEATH — deceased

## 2014-05-06 ENCOUNTER — Ambulatory Visit: Payer: Medicare Other | Admitting: Neurology

## 2014-08-07 IMAGING — CT CT ABD-PELV W/ CM
2 of 5 series · 17 of 46 positions shown, 19 images · IV contrast (APPLIED)
Comparison: CT scan dated to [DATE] and 02/08/2008

CLINICAL DATA: Abdominal pain.

CT ABDOMEN AND PELVIS WITH CONTRAST
TECHNIQUE: Multidetector CT imaging of the abdomen and pelvis was
performed following the standard protocol during bolus
administration of intravenous contrast.
Contrast: 1 OMNIPAQUE IOHEXOL 300 MG/ML  SOLN, 100mL OMNIPAQUE
IOHEXOL 300 MG/ML  SOLN

[Series 2: abd/pelv with 5.0 b31f st · axial · 0.73mm/px · z∈[-461,-81]mm · 14 of 86 slices shown, 16 images]
[im 5/86  soft-tissue]
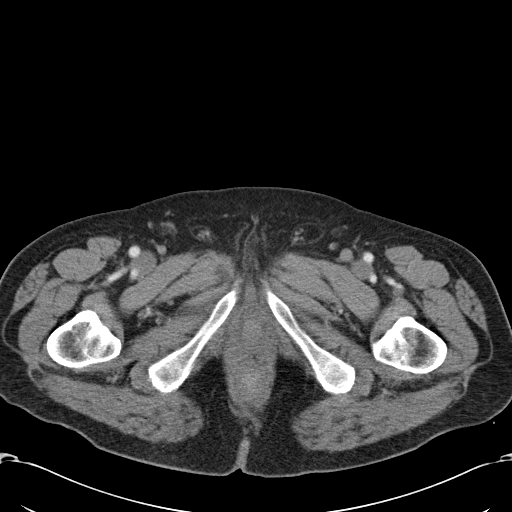
[im 5/86  bone]
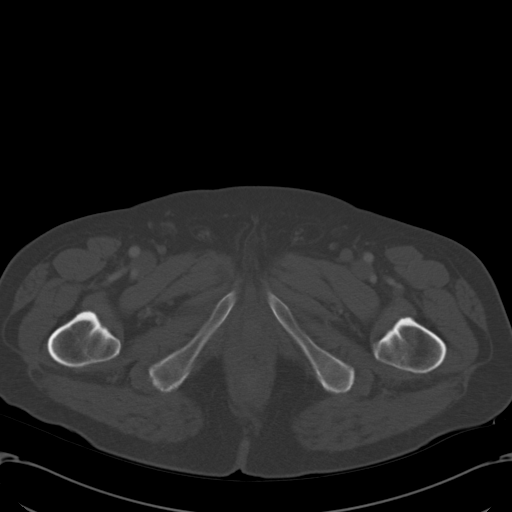
[im 13/86  soft-tissue]
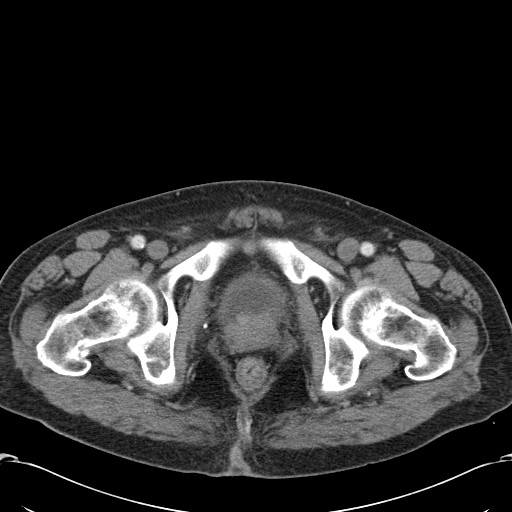
[im 17/86  soft-tissue]
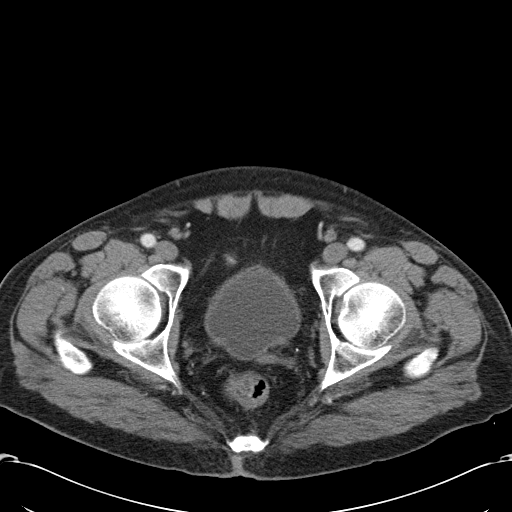
[im 25/86  soft-tissue]
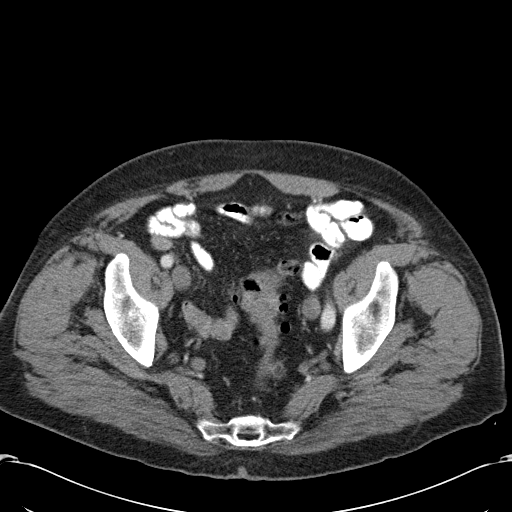
[im 29/86  soft-tissue]
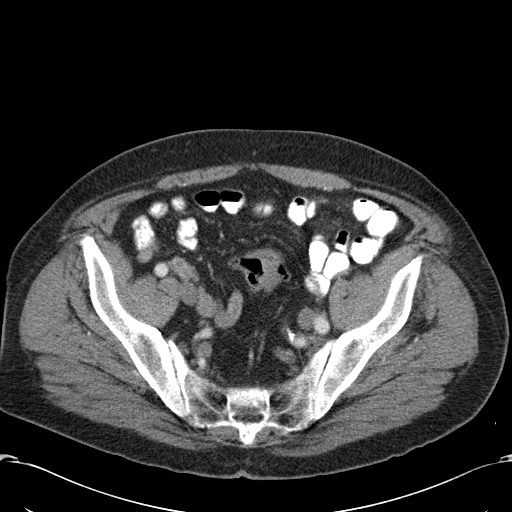
[im 33/86  soft-tissue]
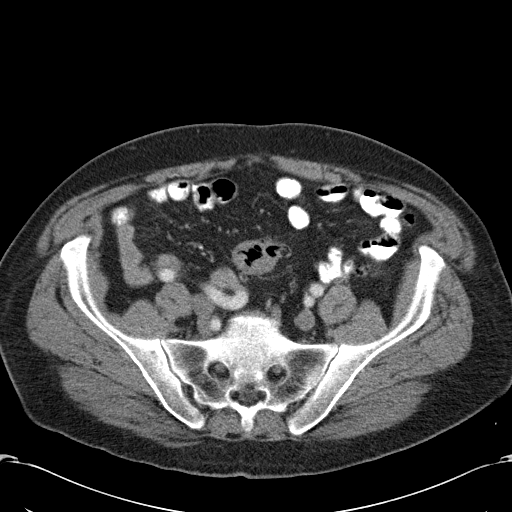
[im 41/86  soft-tissue]
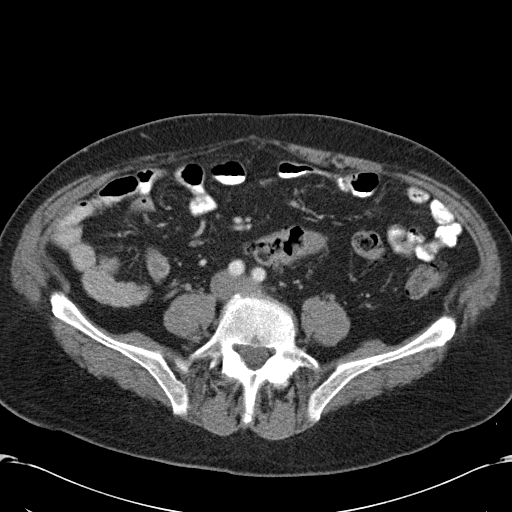
[im 45/86  soft-tissue]
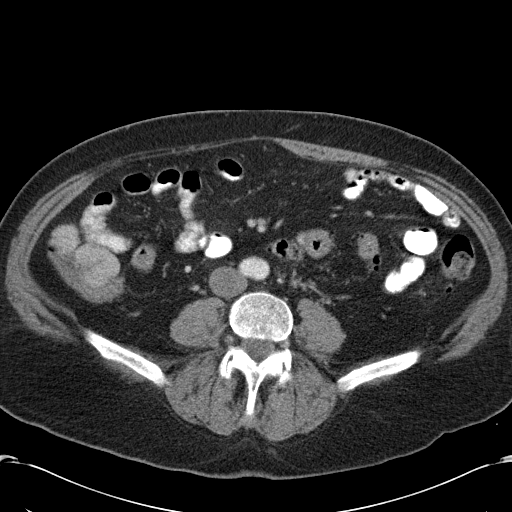
[im 53/86  soft-tissue]
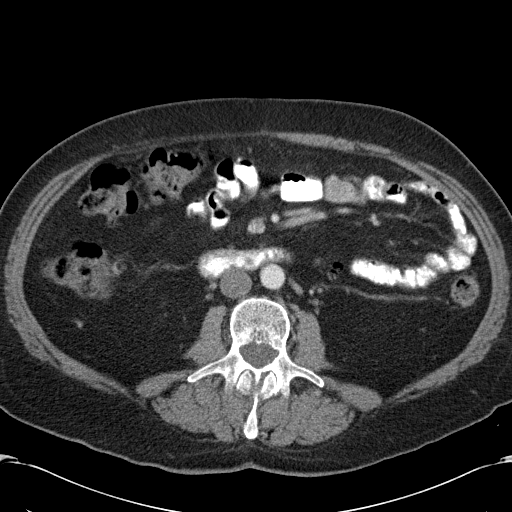
[im 53/86  bone]
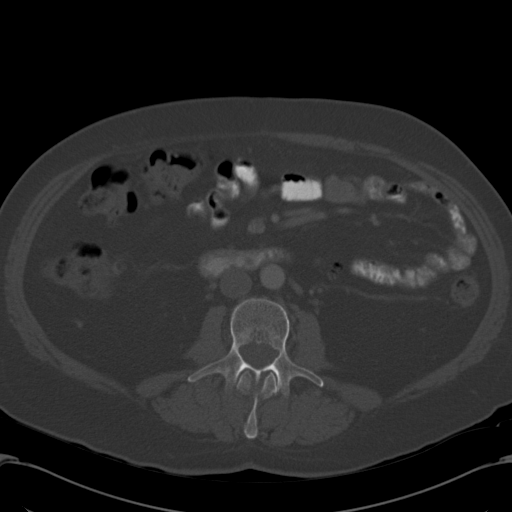
[im 57/86  soft-tissue]
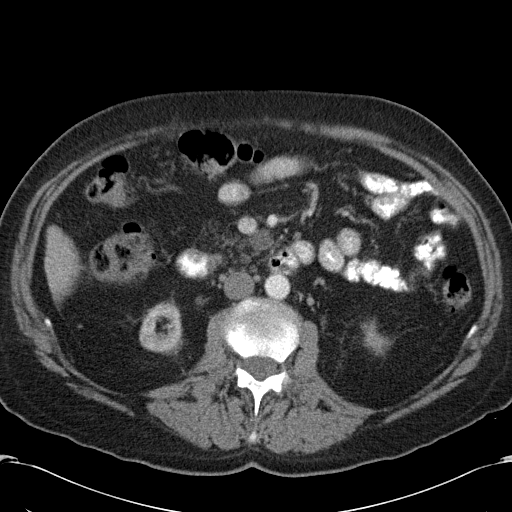
[im 65/86  soft-tissue]
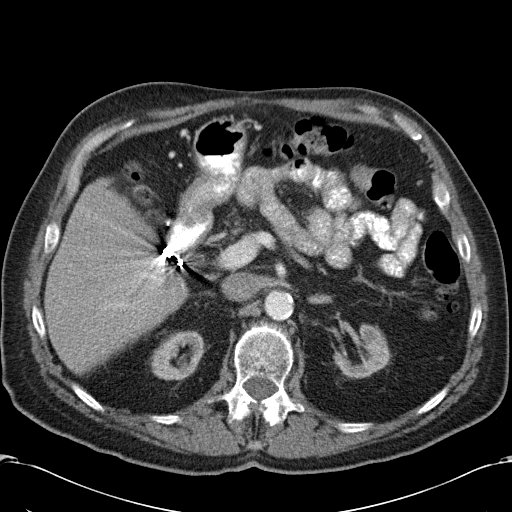
[im 69/86  soft-tissue]
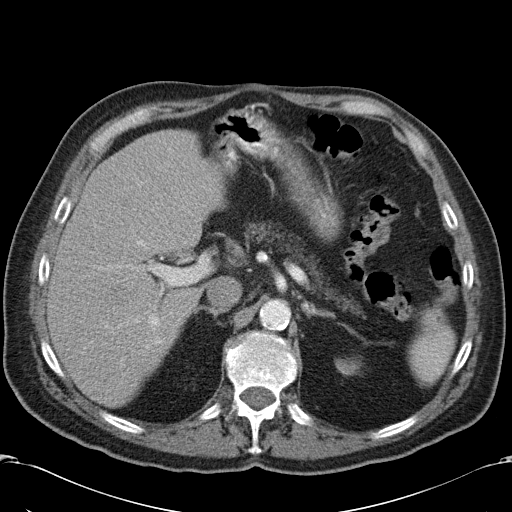
[im 73/86  soft-tissue]
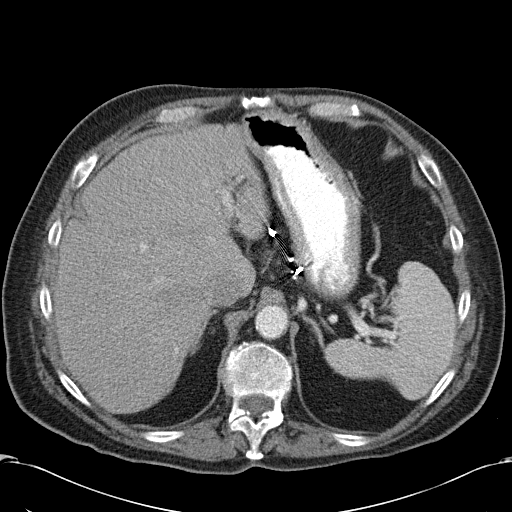
[im 81/86  soft-tissue]
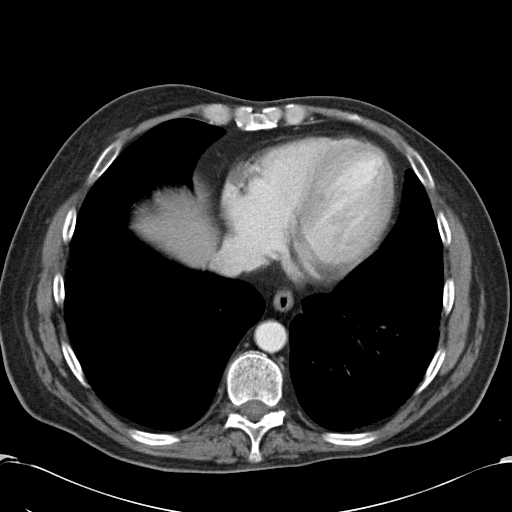

[Series 5: coronals · coronal · 0.90mm/px · 3 of 135 slices shown]
[im 45/135  soft-tissue]
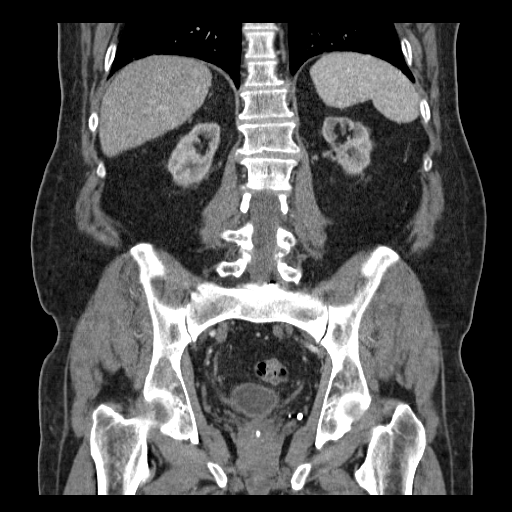
[im 60/135  soft-tissue]
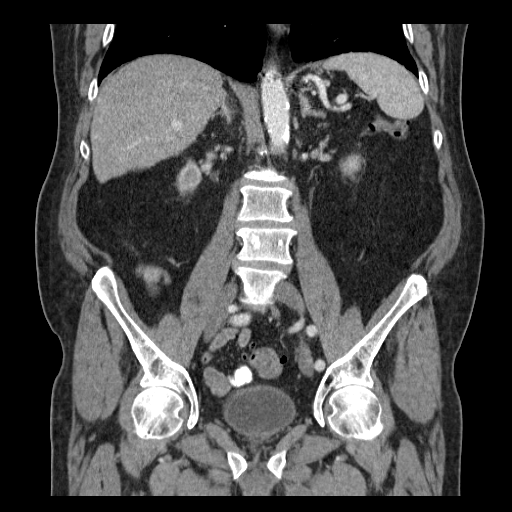
[im 75/135  soft-tissue]
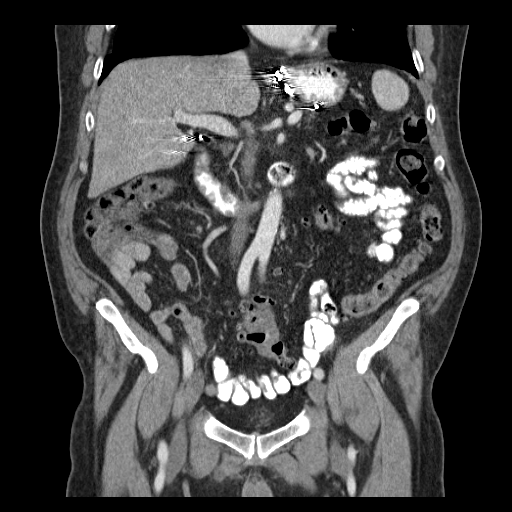

[17 of 46 positions shown; findings below may reference images not displayed]

FINDINGS: No acute abnormalities. The liver, spleen, and adrenal
glands are normal.  Gallbladder has been removed.  No bile duct
dilatation.  There is a 15 mm low density lesion just medial to the
pancreatic head, probably representing a benign mucinous tumor.  It
is essentially unchanged since the prior exam.  Pancreas is
otherwise normal.

Since the prior study the patient has developed marked bilateral
renal atrophy with minimal excretion of contrast in the kidneys.

There are multiple diverticula in the distal colon with no evidence
of diverticulitis.  Terminal ileum and appendix are normal.  No
adenopathy.  There is nodularity and calcification of the prostate
gland.  No acute osseous abnormality.
IMPRESSION: 1.  No acute abnormality in the abdomen or pelvis.
2.  Interval development of severe bilateral renal atrophy.
3.  Probable benign mucinous tumor of the pancreas, stable since
prior study.  It has slightly enlarged since [DATE].

## 2015-03-26 NOTE — Telephone Encounter (Signed)
Error

## 2017-03-13 NOTE — Telephone Encounter (Signed)
Close Encounter
